# Patient Record
Sex: Male | Born: 1990 | Race: White | Hispanic: Yes | Marital: Single | Smoking: Never smoker
Health system: Southern US, Community
[De-identification: ages and names within clinical notes are randomized; demographics above are authoritative.]

## PROBLEM LIST (undated history)

## (undated) DIAGNOSIS — F419 Anxiety disorder, unspecified: Secondary | ICD-10-CM

## (undated) DIAGNOSIS — D61818 Other pancytopenia: Secondary | ICD-10-CM

## (undated) DIAGNOSIS — F840 Autistic disorder: Secondary | ICD-10-CM

## (undated) HISTORY — DX: Autistic disorder: F84.0

## (undated) HISTORY — DX: Other pancytopenia: D61.818

---

## 2015-05-16 DIAGNOSIS — D696 Thrombocytopenia, unspecified: Secondary | ICD-10-CM | POA: Diagnosis not present

## 2015-05-16 DIAGNOSIS — D72819 Decreased white blood cell count, unspecified: Secondary | ICD-10-CM | POA: Diagnosis not present

## 2015-10-11 DIAGNOSIS — Z111 Encounter for screening for respiratory tuberculosis: Secondary | ICD-10-CM | POA: Diagnosis not present

## 2015-10-11 DIAGNOSIS — F840 Autistic disorder: Secondary | ICD-10-CM | POA: Diagnosis not present

## 2016-04-27 DIAGNOSIS — I10 Essential (primary) hypertension: Secondary | ICD-10-CM | POA: Diagnosis not present

## 2016-04-27 DIAGNOSIS — R5381 Other malaise: Secondary | ICD-10-CM | POA: Diagnosis not present

## 2016-04-27 DIAGNOSIS — E784 Other hyperlipidemia: Secondary | ICD-10-CM | POA: Diagnosis not present

## 2016-05-11 NOTE — Progress Notes (Signed)
Hematology/Oncology Consult note [facility] Telephone:([phone] [phone]  Patient Care Team: Bobby Athens, MD as PCP - General (Internal Medicine)   Name of the patient: Bobby Dean  [id number]  February 08, 1991    Reason for referral- thrombocytopenia   Referring physician- Dr. Lavera Dean  Date of visit: 05/13/2016    History of presenting illness- Patient is a 26 year old male was then referred to [place] for evaluation and management of thrombocytopenia. He has a history of autism and intellectual visibility and currently lives in a group home for the last 7 months. His medical decision maker is Mr. Bobby Dean from [place] office.   Blood work from 04/28/2016 showed white count of 2.1 with an H&H of 15.5/45.1 and a platelet count of 51. CMP was within normal limits. TSH was normal at 1.09. At this time I do not have any prior CBCs for comparison.  Patient is here with his group home Asst. Bobby Dean today who reports that patient has been doing well over the last 7 months and has no known medical problems. He does not take any medications or over-the-counter medications. He has been feeling well and has had no sick days and denies any complaints. Patient is comfortable and is able to give Limited history and denies any complaints at this time his mother died of cancer (unknown which one). Other family history is not known.  ECOG PS- 0  Pain scale- 0   Review of systems- Review of Systems  Constitutional: Negative for chills, fever, malaise/fatigue and weight loss.  HENT: Negative for congestion, ear discharge and nosebleeds.   Eyes: Negative for blurred vision.  Respiratory: Negative for cough, hemoptysis, sputum production, shortness of breath and wheezing.   Cardiovascular: Negative for chest pain, palpitations, orthopnea and claudication.  Gastrointestinal: Negative for abdominal pain, blood in stool, constipation, diarrhea, heartburn,  melena, nausea and vomiting.  Genitourinary: Negative for dysuria, flank pain, frequency, hematuria and urgency.  Musculoskeletal: Negative for back pain, joint pain and myalgias.  Skin: Negative for rash.  Neurological: Negative for dizziness, tingling, focal weakness, seizures, weakness and headaches.  Endo/Heme/Allergies: Does not bruise/bleed easily.  Psychiatric/Behavioral: Negative for depression and suicidal ideas. The patient does not have insomnia.     No Known Allergies  Patient Active Problem List   Diagnosis Date Noted  . Autism 05/13/2016     Past Medical History:  Diagnosis Date  . Autism      History reviewed. No pertinent surgical history.  Social History   Social History  . Marital status: Single    Spouse name: N/A  . Number of children: N/A  . Years of education: N/A   Occupational History  . Not on file.   Social History Main Topics  . Smoking status: Never Smoker  . Smokeless tobacco: Never Used  . Alcohol use No  . Drug use: No  . Sexual activity: Not on file   Other Topics Concern  . Not on file   Social History Narrative  . No narrative on file     History reviewed. No pertinent family history. Mother died of cancer  No current outpatient prescriptions on file.   Physical exam:  Vitals:   05/13/16 1001  BP: 137/90  Pulse: 76  Resp: 18  Temp: 97.1 F (36.2 C)  TempSrc: Tympanic  Weight: 188 lb 15 oz (85.7 kg)  Height: 6' 2.8" (1.9 m)   Physical Exam  Constitutional: He is oriented to person, place, and time and  well-developed, well-nourished, and in no distress.  HENT:  Head: Normocephalic and atraumatic.  Eyes: EOM are normal. Pupils are equal, round, and reactive to light.  Neck: Normal range of motion.  Cardiovascular: Normal rate, regular rhythm and normal heart sounds.   Pulmonary/Chest: Effort normal and breath sounds normal.  Abdominal: Soft. Bowel sounds are normal.  No palpable splenomegaly  Lymphadenopathy:    No palpable cervical axillary or inguinal adenopathy  Neurological: He is alert and oriented to person, place, and time.  Skin: Skin is warm and dry.      Assessment and plan- Patient is a 26 y.o. male who has been referred to [place] for leukopenia and thrombocytopenia  1. Patient has significant leukopenia and thrombocytopenia based on labs from 04/28/2016. Differential on the CBC was not done. Today I will obtain a CBC with manual differential along with  B12 and folate as well as pathology review of his smear. I will also obtain HIV and hepatitis B and hepatitis C testing as well as peripheral flow cytometry. If his CBC shows improvement from prior values, this could be from secondary to a transient bone marrow suppression from a viral condition and can be monitored with a repeat CBC in 2 weeks' time   If his CBC remains more or less similar to his prior values or lower , I would like to proceed with a bone marrow biopsy ASAP to rule out primary hematologic disorder such as lymphoma or leukemia contribution to his pancytopenia. I will see the patient back in 2 weeks' time to discuss the results of his blood work and bone marrow biopsy and further management at that time.  I did discuss how bone marrow biopsy is done with radiology and have also given him written information about the same. Bobby Dean from his group home and will speak to Mr. [name] was in [place] and his healthcare proxy and proceed with bone marrow biopsy after he gives consent.   Thank you for this kind referral and the opportunity to participate in the care of this patient   Visit Diagnosis 1. Pancytopenia ([place])     [name], MD, MPH [place] at [facility] Pager- [phone] 05/13/2016 11:11 AM   ADDENDUM: cbc came back with wbc of 1.7 with anc of 0.4 consistent with severe neutropenia and platelet count of 22. He needs a bm biopsy asap. We will also inform his group home that he cannot play  basketball until his diagnosis is ascertained. He needs to stay away from sick people and call us if he has fever or infectious symptoms as well as any bleeding. We will obtain pt , ptt inr, ldh and monospot test when he comes for his bm biopsy.   [name], MD, MPH [place] at [facility] [phone] 05/13/2016 11:40 AM

## 2016-05-13 ENCOUNTER — Telehealth: Payer: Self-pay | Admitting: *Deleted

## 2016-05-13 ENCOUNTER — Inpatient Hospital Stay: Payer: Medicare Other | Attending: Oncology | Admitting: Oncology

## 2016-05-13 ENCOUNTER — Other Ambulatory Visit: Payer: Self-pay | Admitting: *Deleted

## 2016-05-13 ENCOUNTER — Encounter: Payer: Self-pay | Admitting: Oncology

## 2016-05-13 ENCOUNTER — Inpatient Hospital Stay: Payer: Medicare Other

## 2016-05-13 VITALS — BP 137/90 | HR 76 | Temp 97.1°F | Resp 18 | Ht 74.8 in | Wt 188.9 lb

## 2016-05-13 DIAGNOSIS — D61818 Other pancytopenia: Secondary | ICD-10-CM | POA: Diagnosis not present

## 2016-05-13 DIAGNOSIS — F840 Autistic disorder: Secondary | ICD-10-CM | POA: Insufficient documentation

## 2016-05-13 DIAGNOSIS — F419 Anxiety disorder, unspecified: Secondary | ICD-10-CM | POA: Diagnosis not present

## 2016-05-13 DIAGNOSIS — D696 Thrombocytopenia, unspecified: Secondary | ICD-10-CM

## 2016-05-13 LAB — DIFFERENTIAL
BASOS ABS: 0 10*3/uL (ref 0–0.1)
Basophils Relative: 1 %
EOS ABS: 0 10*3/uL (ref 0–0.7)
EOS PCT: 2 %
LYMPHS ABS: 0.8 10*3/uL — AB (ref 1.0–3.6)
LYMPHS PCT: 49 %
MONOS PCT: 25 %
Monocytes Absolute: 0.4 10*3/uL (ref 0.2–1.0)
NEUTROS PCT: 23 %
Neutro Abs: 0.4 10*3/uL — ABNORMAL LOW (ref 1.4–6.5)

## 2016-05-13 LAB — CBC
HEMATOCRIT: 45.3 % (ref 40.0–52.0)
HEMOGLOBIN: 15.6 g/dL (ref 13.0–18.0)
MCH: 28.6 pg (ref 26.0–34.0)
MCHC: 34.4 g/dL (ref 32.0–36.0)
MCV: 83.2 fL (ref 80.0–100.0)
Platelets: 22 10*3/uL — CL (ref 150–400)
RBC: 5.44 MIL/uL (ref 4.40–5.90)
RDW: 13.2 % (ref 11.5–14.5)
WBC: 1.7 10*3/uL — ABNORMAL LOW (ref 3.8–10.6)

## 2016-05-13 LAB — PATHOLOGIST SMEAR REVIEW

## 2016-05-13 LAB — VITAMIN B12: VITAMIN B 12: 401 pg/mL (ref 180–914)

## 2016-05-13 LAB — FOLATE: Folate: 26 ng/mL (ref >5.9)

## 2016-05-13 NOTE — Progress Notes (Signed)
Patient and group home representative have no medical history information and she reports that the guardian has no medical history information for either the patient or the family.  Patient reports no surgeries and never being sick.  Reports mother has passed but has no idea from what.  Unsure if father is alive or anything about him.  Patient has living sister and she is healthy.

## 2016-05-13 NOTE — Telephone Encounter (Signed)
Called Bobby Dean as well as calling DSS guardian Purvis SheffieldDarryl Moore and the guardian gave verbal permission to have bone marrow bx tom. Pt will need to be there 7 am for 8 am procedure.  I have called and left message with Bobby Dean to call me back and gave time and date but I had already spoke to her over the phone earlier today trying to figure out the entire process. She was willing to bring him in tom to the procedure. She called back jsut a moment ago and she will have the pt here in am 7 o clock and he will be NPO after midnight. Juanita in specials notified they will be here.

## 2016-05-14 ENCOUNTER — Ambulatory Visit
Admission: RE | Admit: 2016-05-14 | Discharge: 2016-05-14 | Disposition: A | Discharge status: Home / Self Care | Payer: Medicare Other | Source: Ambulatory Visit | Attending: Oncology | Admitting: Oncology

## 2016-05-14 ENCOUNTER — Other Ambulatory Visit (HOSPITAL_COMMUNITY)
Admission: RE | Admit: 2016-05-14 | Disposition: A | Discharge status: Home / Self Care | Payer: Medicaid Other | Source: Other Acute Inpatient Hospital | Attending: Oncology | Admitting: Oncology

## 2016-05-14 DIAGNOSIS — D61818 Other pancytopenia: Secondary | ICD-10-CM | POA: Diagnosis not present

## 2016-05-14 DIAGNOSIS — D72819 Decreased white blood cell count, unspecified: Secondary | ICD-10-CM | POA: Diagnosis not present

## 2016-05-14 DIAGNOSIS — F840 Autistic disorder: Secondary | ICD-10-CM | POA: Diagnosis not present

## 2016-05-14 DIAGNOSIS — D696 Thrombocytopenia, unspecified: Secondary | ICD-10-CM | POA: Insufficient documentation

## 2016-05-14 DIAGNOSIS — D704 Cyclic neutropenia: Secondary | ICD-10-CM | POA: Insufficient documentation

## 2016-05-14 DIAGNOSIS — F419 Anxiety disorder, unspecified: Secondary | ICD-10-CM | POA: Insufficient documentation

## 2016-05-14 HISTORY — DX: Anxiety disorder, unspecified: F41.9

## 2016-05-14 LAB — PROTIME-INR
INR: 1.11
Prothrombin Time: 14.4 seconds (ref 11.4–15.2)

## 2016-05-14 LAB — DIFFERENTIAL
Basophils Absolute: 0 10*3/uL (ref 0–0.1)
Basophils Relative: 1 %
Eosinophils Absolute: 0.1 10*3/uL (ref 0–0.7)
Eosinophils Relative: 4 %
LYMPHS ABS: 1.2 10*3/uL (ref 1.0–3.6)
LYMPHS PCT: 54 %
MONO ABS: 0.5 10*3/uL (ref 0.2–1.0)
Monocytes Relative: 25 %
NEUTROS PCT: 16 %
Neutro Abs: 0.3 10*3/uL — ABNORMAL LOW (ref 1.4–6.5)

## 2016-05-14 LAB — CBC
HCT: 46.7 % (ref 40.0–52.0)
HEMOGLOBIN: 15.9 g/dL (ref 13.0–18.0)
MCH: 28.3 pg (ref 26.0–34.0)
MCHC: 33.9 g/dL (ref 32.0–36.0)
MCV: 83.4 fL (ref 80.0–100.0)
PLATELETS: 24 10*3/uL — AB (ref 150–440)
RBC: 5.6 MIL/uL (ref 4.40–5.90)
RDW: 13.7 % (ref 11.5–14.5)
WBC: 2.2 10*3/uL — AB (ref 3.8–10.6)

## 2016-05-14 LAB — HEPATITIS B SURFACE ANTIGEN: HEP B S AG: NEGATIVE

## 2016-05-14 LAB — HIV ANTIBODY (ROUTINE TESTING W REFLEX): HIV Screen 4th Generation wRfx: NONREACTIVE

## 2016-05-14 LAB — HEPATITIS C ANTIBODY: HCV Ab: <0.1 s/co ratio (ref 0.0–0.9)

## 2016-05-14 LAB — LACTATE DEHYDROGENASE: LDH: 143 U/L (ref 98–192)

## 2016-05-14 LAB — BONE MARROW EXAM

## 2016-05-14 LAB — MONONUCLEOSIS SCREEN: MONO SCREEN: POSITIVE — AB

## 2016-05-14 LAB — APTT: aPTT: 32 seconds (ref 24–36)

## 2016-05-14 LAB — HEPATITIS B CORE ANTIBODY, TOTAL: HEP B C TOTAL AB: NEGATIVE

## 2016-05-14 MED ORDER — SODIUM CHLORIDE 0.9 % IV SOLN
INTRAVENOUS | Status: DC
  Administered 2016-05-14: 08:00:00 via INTRAVENOUS

## 2016-05-14 MED ORDER — FENTANYL CITRATE (PF) 100 MCG/2ML IJ SOLN
INTRAMUSCULAR | Status: AC
  Filled 2016-05-14: qty 4

## 2016-05-14 MED ORDER — MIDAZOLAM HCL 5 MG/5ML IJ SOLN
INTRAMUSCULAR | Status: AC
Start: 2016-05-14 — End: 2016-05-14
  Filled 2016-05-14: qty 5

## 2016-05-14 MED ORDER — FENTANYL CITRATE (PF) 100 MCG/2ML IJ SOLN
INTRAMUSCULAR | Status: AC | PRN
  Administered 2016-05-14: 25 ug via INTRAVENOUS
  Administered 2016-05-14: 50 ug via INTRAVENOUS

## 2016-05-14 MED ORDER — HEPARIN SOD (PORK) LOCK FLUSH 100 UNIT/ML IV SOLN
INTRAVENOUS | Status: AC
  Filled 2016-05-14: qty 5

## 2016-05-14 MED ORDER — MIDAZOLAM HCL 5 MG/5ML IJ SOLN
INTRAMUSCULAR | Status: AC | PRN
  Administered 2016-05-14 (×2): 1 mg via INTRAVENOUS

## 2016-05-14 NOTE — H&P (Signed)
Chief Complaint: Patient was seen in consultation today for a bone marrow biopsy at the request of Rao,Archana C  Referring Physician(s): Rao,Archana C  Patient Status: [facility] - Out-pt  History of Present Illness: Bobby Dean is a 26 y.o. male with autism who lives in a group home. Patient was found to have thrombocytopenia and leukopenia. Part of the patient's workup will include a bone marrow biopsy. The patient has no complaints today. Consent was obtained for the patient's legal guardian.  Past Medical History:  Diagnosis Date  . Anxiety   . Autism   . Autism     History reviewed. No pertinent surgical history.  Allergies: Patient has no known allergies.  Medications: Prior to Admission medications   Not on File     Family History  Problem Relation Age of Onset  . Cancer Mother     Social History   Social History  . Marital status: Single    Spouse name: N/A  . Number of children: N/A  . Years of education: N/A   Social History Main Topics  . Smoking status: Never Smoker  . Smokeless tobacco: Never Used  . Alcohol use No  . Drug use: No  . Sexual activity: Not Asked   Other Topics Concern  . None   Social History Narrative  . None    ECOG Status: 0 - Asymptomatic  Review of Systems: A 12 point ROS discussed and pertinent positives are indicated in the HPI above.  All other systems are negative.  Review of Systems  Constitutional: Negative.   Respiratory: Negative.   Cardiovascular: Negative.     Vital Signs: BP (!) 146/98   Pulse 73   Temp 98.4 F (36.9 C) (Oral)   Resp (!) 22   SpO2 100%   Physical Exam  Constitutional: He appears well-developed and well-nourished. No distress.  Cardiovascular: Normal rate, regular rhythm and normal heart sounds.  Exam reveals no gallop and no friction rub.   No murmur heard. Pulmonary/Chest: Effort normal and breath sounds normal. No respiratory distress. He has no wheezes.  Abdominal:  Soft. He exhibits no distension. There is no tenderness.    Mallampati Score:  MD Evaluation Airway: WNL Heart: WNL Abdomen: WNL Chest/ Lungs: WNL ASA  Classification: 1 Mallampati/Airway Score: One  Imaging: No results found.  Labs:  CBC:  Recent Labs  05/13/16 1045 05/14/16 0705  WBC 1.7* 2.2*  HGB 15.6 15.9  HCT 45.3 46.7  PLT 22* PENDING    COAGS:  Recent Labs  05/14/16 0705  INR 1.11  APTT 32    BMP: No results for input(s): NA, K, CL, CO2, GLUCOSE, BUN, CALCIUM, CREATININE, GFRNONAA, GFRAA in the last 8760 hours.  Invalid input(s): CMP  LIVER FUNCTION TESTS: No results for input(s): BILITOT, AST, ALT, ALKPHOS, PROT, ALBUMIN in the last 8760 hours.  TUMOR MARKERS: No results for input(s): AFPTM, CEA, CA199, CHROMGRNA in the last 8760 hours.  Assessment and Plan:  26 year old with leukopenia and thrombocytopenia. Patient presents for CT-guided bone marrow biopsy. The risks of the procedure including bleeding and infection were discussed. Informed consent was obtained from the patient's legal guardian. Plan for CT-guided bone marrow biopsy with moderate sedation.  Thank you for this interesting consult.  I greatly enjoyed meeting Bobby Dean and look forward to participating in their care.  A copy of this report was sent to the requesting provider on this date.  Electronically Signed: Carylon Perches 05/14/2016, 7:51 AM   I  spent a total of  15 Minutes   in face to face in clinical consultation, greater than 50% of which was counseling/coordinating care for a CT guided bone marrow biopsy.

## 2016-05-14 NOTE — Procedures (Signed)
CT guided bone marrow biopsy.   Minimal blood loss and no immediate complication.  See full report in Imaging.   

## 2016-05-18 ENCOUNTER — Telehealth: Payer: Self-pay | Admitting: *Deleted

## 2016-05-18 DIAGNOSIS — D709 Neutropenia, unspecified: Secondary | ICD-10-CM

## 2016-05-18 DIAGNOSIS — D696 Thrombocytopenia, unspecified: Secondary | ICD-10-CM

## 2016-05-18 NOTE — Telephone Encounter (Signed)
Called wendy the person over the family care home for pt. I told her that bone marrow does not show leukemia, wants pt to see md on Friday of this week and have labs.  He did test positive for mono.  Toniann FailWendy is agreeable to appt Friday 3/9 and 11  Am for blood work and 11:15 for see md and she is agreeable.

## 2016-05-20 ENCOUNTER — Other Ambulatory Visit: Payer: Self-pay | Admitting: *Deleted

## 2016-05-20 DIAGNOSIS — D696 Thrombocytopenia, unspecified: Secondary | ICD-10-CM

## 2016-05-21 ENCOUNTER — Encounter: Payer: Self-pay | Admitting: Oncology

## 2016-05-21 ENCOUNTER — Inpatient Hospital Stay (HOSPITAL_BASED_OUTPATIENT_CLINIC_OR_DEPARTMENT_OTHER): Payer: Medicare Other | Admitting: Oncology

## 2016-05-21 ENCOUNTER — Inpatient Hospital Stay: Payer: Medicare Other

## 2016-05-21 VITALS — BP 139/90 | HR 73 | Temp 97.4°F | Resp 18 | Ht 74.8 in | Wt 187.8 lb

## 2016-05-21 DIAGNOSIS — F419 Anxiety disorder, unspecified: Secondary | ICD-10-CM | POA: Diagnosis not present

## 2016-05-21 DIAGNOSIS — F840 Autistic disorder: Secondary | ICD-10-CM

## 2016-05-21 DIAGNOSIS — D61818 Other pancytopenia: Secondary | ICD-10-CM | POA: Diagnosis not present

## 2016-05-21 DIAGNOSIS — D696 Thrombocytopenia, unspecified: Secondary | ICD-10-CM

## 2016-05-21 DIAGNOSIS — D709 Neutropenia, unspecified: Secondary | ICD-10-CM

## 2016-05-21 LAB — CBC
HCT: 45.7 % (ref 40.0–52.0)
HEMOGLOBIN: 16 g/dL (ref 13.0–18.0)
MCH: 28.9 pg (ref 26.0–34.0)
MCHC: 35 g/dL (ref 32.0–36.0)
MCV: 82.6 fL (ref 80.0–100.0)
Platelets: 40 10*3/uL — ABNORMAL LOW (ref 150–440)
RBC: 5.54 MIL/uL (ref 4.40–5.90)
RDW: 13.5 % (ref 11.5–14.5)
WBC: 1.8 10*3/uL — AB (ref 3.8–10.6)

## 2016-05-21 LAB — CHROMOSOME ANALYSIS, BONE MARROW

## 2016-05-21 LAB — DIFFERENTIAL
Basophils Absolute: 0 10*3/uL (ref 0–0.1)
Basophils Relative: 2 %
EOS PCT: 1 %
Eosinophils Absolute: 0 10*3/uL (ref 0–0.7)
LYMPHS ABS: 0.8 10*3/uL — AB (ref 1.0–3.6)
Lymphocytes Relative: 44 %
MONO ABS: 0.4 10*3/uL (ref 0.2–1.0)
Monocytes Relative: 25 %
NEUTROS ABS: 0.5 10*3/uL — AB (ref 1.4–6.5)
Neutrophils Relative %: 28 %

## 2016-05-21 NOTE — Progress Notes (Signed)
Hematology/Oncology Consult note [facility]  Telephone:([phone] Fax:([phone]  Patient Care Team: [name], MD as PCP - General (Internal Medicine)   Name of the patient: Bobby Dean  [id number]  25-Nov-1990   Date of visit: 05/21/16  Diagnosis- pancytopenia likely viral etiology  Chief complaint/ Reason for visit- discuss bone marrow results  Heme/Onc history:  Patient is a 26 year old male was then referred to [place] for evaluation and management of thrombocytopenia. He has a history of autism and intellectual visibility and currently lives in a group home for the last 7 months. His medical decision maker is [name] from [place] office.   Blood work from 04/28/2016 showed white count of 2.1 with an H&H of 15.5/45.1 and a platelet count of 51. CMP was within normal limits. TSH was normal at 1.09. At this time I do not have any prior CBCs for comparison.  Patient is here with his group home Asst. [name] today who reports that patient has been doing well over the last 7 months and has no known medical problems. He does not take any medications or over-the-counter medications. He has been feeling well and has had no sick days and denies any complaints. Patient is comfortable and is able to give Limited history and denies any complaints at this time his mother died of cancer (unknown which one). Other family history is not known.  Bone marrow biopsy on 05/14/2016 showed normal trilineage hematopoiesis with no evidence of lymphoma or leukemia. Peripheral flow cytometry showed reversal of CD4 to CD8 ratio. HIV hepatitis B and hepatitis C testing was negative. B12 and folate were within normal limits. PT/PTT INR was within normal limits.  Review of peripheral smear showed leukopenia and moderate neutropenia. Thrombocytopenia. RBC morphology is normal. Scattered reactive appearing lymphocytes are seen. No blasts or early forms  noted.  Interval history- overall patient is doing well at his group home. Denies any fevers. Appetite is normal and he has lost 2 pounds of weight as compared to 2 weeks ago.  ECOG PS- 0 Pain scale- 0 Opioid associated constipation- no  Review of systems- Review of Systems  Constitutional: Negative for chills, fever, malaise/fatigue and weight loss.  HENT: Negative for congestion, ear discharge and nosebleeds.   Eyes: Negative for blurred vision.  Respiratory: Negative for cough, hemoptysis, sputum production, shortness of breath and wheezing.   Cardiovascular: Negative for chest pain, palpitations, orthopnea and claudication.  Gastrointestinal: Negative for abdominal pain, blood in stool, constipation, diarrhea, heartburn, melena, nausea and vomiting.  Genitourinary: Negative for dysuria, flank pain, frequency, hematuria and urgency.  Musculoskeletal: Negative for back pain, joint pain and myalgias.  Skin: Negative for rash.  Neurological: Negative for dizziness, tingling, focal weakness, seizures, weakness and headaches.  Endo/Heme/Allergies: Does not bruise/bleed easily.  Psychiatric/Behavioral: Negative for depression and suicidal ideas. The patient does not have insomnia.      Current treatment- observation  No Known Allergies   Past Medical History:  Diagnosis Date  . Anxiety   . Autism   . Autism      History reviewed. No pertinent surgical history.  Social History   Social History  . Marital status: Single    Spouse name: N/A  . Number of children: N/A  . Years of education: N/A   Occupational History  . Not on file.   Social History Main Topics  . Smoking status: Never Smoker  . Smokeless tobacco: Never Used  . Alcohol use No  .  Drug use: No  . Sexual activity: Not on file   Other Topics Concern  . Not on file   Social History Narrative  . No narrative on file    Family History  Problem Relation Age of Onset  . Cancer Mother     No current  outpatient prescriptions on file.  Physical exam:  Vitals:   05/21/16 1200 05/21/16 1220  BP: (!) 154/105 139/90  Pulse: 73   Resp: 18   Temp: 97.4 F (36.3 C)   TempSrc: Tympanic   Weight: 187 lb 13.3 oz (85.2 kg)   Height: 6' 2.8" (1.9 m)    Physical Exam  Constitutional: He is oriented to person, place, and time and well-developed, well-nourished, and in no distress.  HENT:  Head: Normocephalic and atraumatic.  Eyes: EOM are normal. Pupils are equal, round, and reactive to light.  Neck: Normal range of motion.  Cardiovascular: Normal rate, regular rhythm and normal heart sounds.   Pulmonary/Chest: Effort normal and breath sounds normal.  Abdominal: Soft. Bowel sounds are normal.  Neurological: He is alert and oriented to person, place, and time.  Skin: Skin is warm and dry.     No flowsheet data found. CBC Latest Ref Rng & Units 05/21/2016  WBC 3.8 - 10.6 K/uL 1.8(L)  Hemoglobin 13.0 - 18.0 g/dL 16.0  Hematocrit 40.0 - 52.0 % 45.7  Platelets 150 - 440 K/uL 40(L)    No images are attached to the encounter.  Ct Bone Marrow Biopsy & Aspiration  Result Date: 05/14/2016 INDICATION: 26 year old with leukopenia and thrombocytopenia. EXAM: CT GUIDED BONE MARROW ASPIRATES AND BIOPSY Physician: Stephan Minister. Anselm Pancoast, MD MEDICATIONS: None. ANESTHESIA/SEDATION: Fentanyl 75 mcg IV; Versed 2.0 mg IV Moderate Sedation Time:  22 minutes The patient was continuously monitored during the procedure by the interventional radiology nurse under my direct supervision. COMPLICATIONS: None immediate. PROCEDURE: The procedure was explained to the patient. The risks and benefits of the procedure were discussed and the patient's questions were addressed. Informed consent was obtained from the patient and his legal guardian. The patient was placed prone on CT scan. Images of the pelvis were obtained. Maximal barrier sterile technique was utilized including mask, sterile gowns, sterile gloves, sterile drape, hand  hygiene and skin antiseptic. The right side of back was prepped and draped in sterile fashion. The skin and right posterior iliac bone were anesthetized with 1% lidocaine. 11 gauge bone needle was directed into the right iliac bone with CT guidance. Two aspirates and one core biopsy obtained. Bandage placed over the puncture site. FINDINGS: Bone needle directed into the posterior right ilium. IMPRESSION: CT guided bone marrow aspirates and core biopsy. Electronically Signed   By: Markus Daft M.D.   On: 05/14/2016 09:39     Assessment and plan- Patient is a 26 y.o. male who was referred to [place] for neutropenia and thrombocytopenia  I discussed the results of the blood work with the patient as well as his group home provider. Essentially all the workup including bone marrow biopsy has been negative except for Monospot test which was positive. However patient has no signs and symptoms of infectious mononucleosis. On repeat CBC today his white count is still low at 1.8 but his platelet count is better as compared to 2 weeks ago when it was in the 20s. Clinically patient is asymptomatic and has no bleeding, bruising or infectious symptoms. I suspect that his pancytopenia is secondary to possible infectious mononucleosis versus some other viral etiology. I will  see the patient back in 1 week's time with a repeat CBC. ANA, copper levels and quantitative immunoglobulins today are pending. If his counts show further improvement next week and I will continue to follow his counts conservatively. 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 Thrombocytopenia ([place])   2. Neutropenia, unspecified type [facility])      [name], MD, MPH [facility] Pager- [phone] 05/21/2016 1:32 PM

## 2016-05-22 LAB — IGG, IGA, IGM
IGA: 200 mg/dL (ref 90–386)
IGG (IMMUNOGLOBIN G), SERUM: 965 mg/dL (ref 700–1600)
IGM, SERUM: 176 mg/dL — AB (ref 20–172)

## 2016-05-22 LAB — ANA W/REFLEX: Anti Nuclear Antibody(ANA): NEGATIVE

## 2016-05-22 LAB — COPPER, SERUM: Copper: 106 ug/dL (ref 72–166)

## 2016-05-28 ENCOUNTER — Other Ambulatory Visit: Payer: Self-pay | Admitting: *Deleted

## 2016-05-28 ENCOUNTER — Inpatient Hospital Stay: Payer: Medicare Other

## 2016-05-28 ENCOUNTER — Encounter: Payer: Self-pay | Admitting: Oncology

## 2016-05-28 ENCOUNTER — Inpatient Hospital Stay (HOSPITAL_BASED_OUTPATIENT_CLINIC_OR_DEPARTMENT_OTHER): Payer: Medicare Other | Admitting: Oncology

## 2016-05-28 VITALS — BP 106/72 | HR 71 | Temp 98.4°F | Resp 18 | Wt 188.7 lb

## 2016-05-28 DIAGNOSIS — D709 Neutropenia, unspecified: Secondary | ICD-10-CM

## 2016-05-28 DIAGNOSIS — B2790 Infectious mononucleosis, unspecified without complication: Secondary | ICD-10-CM

## 2016-05-28 DIAGNOSIS — F840 Autistic disorder: Secondary | ICD-10-CM | POA: Diagnosis not present

## 2016-05-28 DIAGNOSIS — D61818 Other pancytopenia: Secondary | ICD-10-CM

## 2016-05-28 DIAGNOSIS — D696 Thrombocytopenia, unspecified: Secondary | ICD-10-CM

## 2016-05-28 DIAGNOSIS — F419 Anxiety disorder, unspecified: Secondary | ICD-10-CM | POA: Diagnosis not present

## 2016-05-28 LAB — CBC
HCT: 43.7 % (ref 40.0–52.0)
Hemoglobin: 15.2 g/dL (ref 13.0–18.0)
MCH: 28.4 pg (ref 26.0–34.0)
MCHC: 34.8 g/dL (ref 32.0–36.0)
MCV: 81.8 fL (ref 80.0–100.0)
PLATELETS: 25 10*3/uL — AB (ref 150–400)
RBC: 5.34 MIL/uL (ref 4.40–5.90)
RDW: 13.5 % (ref 11.5–14.5)
WBC: 1.7 10*3/uL — AB (ref 3.8–10.6)

## 2016-05-28 LAB — DIFFERENTIAL
Basophils Absolute: 0 10*3/uL (ref 0–0.1)
Basophils Relative: 1 %
Eosinophils Absolute: 0 10*3/uL (ref 0–0.7)
Eosinophils Relative: 2 %
LYMPHS ABS: 0.9 10*3/uL — AB (ref 1.0–3.6)
LYMPHS PCT: 51 %
MONO ABS: 0.4 10*3/uL (ref 0.2–1.0)
MONOS PCT: 26 %
NEUTROS ABS: 0.3 10*3/uL — AB (ref 1.4–6.5)
NEUTROS PCT: 20 %

## 2016-05-28 MED ORDER — PREDNISONE 10 MG PO TABS: 55.0000 mg | ORAL_TABLET | Freq: Every day | ORAL | 0 refills | Status: DC

## 2016-05-28 MED ORDER — PANTOPRAZOLE SODIUM 20 MG PO TBEC: 20.0000 mg | DELAYED_RELEASE_TABLET | Freq: Every day | ORAL | 0 refills | Status: DC

## 2016-05-28 NOTE — Addendum Note (Signed)
Addended by: Corene CorneaVENABLE, Lyndel Sarate Y on: 05/28/2016 03:12 PM   Modules accepted: Orders

## 2016-05-28 NOTE — Progress Notes (Signed)
Hematology/Oncology Consult note [facility]  Telephone:([phone] [phone]  Patient Care Team: [name], MD as PCP - General (Internal Medicine)   Name of the patient: Bobby Dean  [id number]  March 20, 1990   Date of visit: 05/28/16  Diagnosis- neutropenia/ thrombocytopenia of unclear etiology possibly viral/ autoimmune  Chief complaint/ Reason for visit- routine f/u  Heme/Onc history: Patient is a 26 year old male was then referred to [place] for evaluation and management of thrombocytopenia. He has a history of autism and intellectual visibility and currently lives in a group home for the last 7 months. His medical decision maker is [name] from [place] office.   Blood work from 04/28/2016 showed white count of 2.1 with an H&H of 15.5/45.1 and a platelet count of 51. CMP was within normal limits. TSH was normal at 1.09. At this time I do not have any prior CBCs for comparison.  Patient is here with his group home Asst. [name] today who reports that patient has been doing well over the last 7 months and has no known medical problems. He does not take any medications or over-the-counter medications. He has been feeling well and has had no sick days and denies any complaints.Patient is comfortable and is able to give Limited history and denies any complaints at this time his mother died of cancer (unknown which one). Other family history is not known.  Bone marrow biopsy on 05/14/2016 showed normal trilineage hematopoiesis with no evidence of lymphoma or leukemia. Peripheral flow cytometry showed reversal of CD4 to CD8 ratio. HIV hepatitis B and hepatitis C testing was negative. B12 and folate were within normal limits. PT/PTT INR was within normal limits.  Review of peripheral smear showed leukopenia and moderate neutropenia. Thrombocytopenia. RBC morphology is normal. Scattered reactive appearing lymphocytes are seen. No  blasts or early forms noted.  Monospot testing was positive. ANA was negative. Quantitative immunoglobulins were within normal limits. Serum copper was within normal limits.  Interval history- doing well. Denies any complaints. No fever  ECOG PS- 0 Pain scale- 0 Opioid associated constipation- no  Review of systems- Review of Systems  Constitutional: Negative for chills, fever, malaise/fatigue and weight loss.  HENT: Negative for congestion, ear discharge and nosebleeds.   Eyes: Negative for blurred vision.  Respiratory: Negative for cough, hemoptysis, sputum production, shortness of breath and wheezing.   Cardiovascular: Negative for chest pain, palpitations, orthopnea and claudication.  Gastrointestinal: Negative for abdominal pain, blood in stool, constipation, diarrhea, heartburn, melena, nausea and vomiting.  Genitourinary: Negative for dysuria, flank pain, frequency, hematuria and urgency.  Musculoskeletal: Negative for back pain, joint pain and myalgias.  Skin: Negative for rash.  Neurological: Negative for dizziness, tingling, focal weakness, seizures, weakness and headaches.  Endo/Heme/Allergies: Does not bruise/bleed easily.  Psychiatric/Behavioral: Negative for depression and suicidal ideas. The patient does not have insomnia.      Current treatment- observation  No Known Allergies   Past Medical History:  Diagnosis Date  . Anxiety   . Autism   . Autism      No past surgical history on file.  Social History   Social History  . Marital status: Single    Spouse name: N/A  . Number of children: N/A  . Years of education: N/A   Occupational History  . Not on file.   Social History Main Topics  . Smoking status: Never Smoker  . Smokeless tobacco: Never Used  . Alcohol use No  .  Drug use: No  . Sexual activity: Not on file   Other Topics Concern  . Not on file   Social History Narrative  . No narrative on file    Family History  Problem Relation  Age of Onset  . Cancer Mother     No current outpatient prescriptions on file.  Physical exam:  Vitals:   05/28/16 1158  BP: 106/72  Pulse: 71  Resp: 18  Temp: 98.4 F (36.9 C)  TempSrc: Tympanic  Weight: 188 lb 11.4 oz (85.6 kg)   Physical Exam  Constitutional: He is oriented to person, place, and time and well-developed, well-nourished, and in no distress.  HENT:  Head: Normocephalic and atraumatic.  Eyes: EOM are normal. Pupils are equal, round, and reactive to light.  Neck: Normal range of motion.  Cardiovascular: Normal rate, regular rhythm and normal heart sounds.   Pulmonary/Chest: Effort normal and breath sounds normal.  Abdominal: Soft. Bowel sounds are normal.  Neurological: He is alert and oriented to person, place, and time.  Skin: Skin is warm and dry.     No flowsheet data found. CBC Latest Ref Rng & Units 05/28/2016  WBC 3.8 - 10.6 K/uL 1.7(L)  Hemoglobin 13.0 - 18.0 g/dL 15.2  Hematocrit 40.0 - 52.0 % 43.7  Platelets 150 - 400 K/uL 25(LL)    No images are attached to the encounter.  Ct Bone Marrow Biopsy & Aspiration  Result Date: 05/14/2016 INDICATION: 26 year old with leukopenia and thrombocytopenia. EXAM: CT GUIDED BONE MARROW ASPIRATES AND BIOPSY Physician: Stephan Minister. Anselm Pancoast, MD MEDICATIONS: None. ANESTHESIA/SEDATION: Fentanyl 75 mcg IV; Versed 2.0 mg IV Moderate Sedation Time:  22 minutes The patient was continuously monitored during the procedure by the interventional radiology nurse under my direct supervision. COMPLICATIONS: None immediate. PROCEDURE: The procedure was explained to the patient. The risks and benefits of the procedure were discussed and the patient's questions were addressed. Informed consent was obtained from the patient and his legal guardian. The patient was placed prone on CT scan. Images of the pelvis were obtained. Maximal barrier sterile technique was utilized including mask, sterile gowns, sterile gloves, sterile drape, hand hygiene  and skin antiseptic. The right side of back was prepped and draped in sterile fashion. The skin and right posterior iliac bone were anesthetized with 1% lidocaine. 11 gauge bone needle was directed into the right iliac bone with CT guidance. Two aspirates and one core biopsy obtained. Bandage placed over the puncture site. FINDINGS: Bone needle directed into the posterior right ilium. IMPRESSION: CT guided bone marrow aspirates and core biopsy. Electronically Signed   By: Markus Daft M.D.   On: 05/14/2016 09:39     Assessment and plan- Patient is a 26 y.o. male referred to [place] for neutropenia/ thrombocytopenia  Patient continues to have significant severe neutropenia and thrombocytopenia lasting for at least 1 month. Patient remains asymptomatic. We do not have any prior counts for comparison. All the blood work so far including B12, folate, ANA, monitor immunoglobulins, coagulation studies, HIV and hepatitis B and hepatitis C testing have been negative. Today I will check a CMV DNA PCR as well as ultrasound of the abdomen to look for any hepatosplenomegaly. Clinically I do not palpate any splenomegaly or significant adenopathy. Given that his neutropenia and thrombocytopenia are severe and does not seem to be improving I will start him on empiric trial of steroids with 1 mg/kg of prednisone. I discussed the side effects of prednisone with his group home provider and the  patient that he may speed instance insomnia or psychotic symptoms. The group home provider understands and will communicate this to his medical decision maker as well. Patient will return in one week for repeat CBC which would be reviewed by Dr. Rogue Bussing in my absence. If his neutropenia/thrombocytopenia did not respond to steroids at all- it would be reasonable to stop the steroids at that point and referred him to [facility] for further management and second opinion. However if his counts respond to empiric trial of steroids, it may  be worthwhile to taper him slowly over the next 2-3 weeks. I will see the patient back in 2 weeks' time with a repeat CBC.   Visit Diagnosis 1. Thrombocytopenia ([place])   2. Monospot test positive   3. Neutropenia, unspecified type [facility])      [name], MD, MPH [facility] at [facility] Pager- [phone] 05/28/2016 1:29 PM

## 2016-05-28 NOTE — Progress Notes (Signed)
Patient offers no complaints today.  Would like to discuss when patient can return to playing sports.

## 2016-05-31 ENCOUNTER — Encounter (HOSPITAL_COMMUNITY): Payer: Self-pay

## 2016-06-01 LAB — CMV DNA, QUANTITATIVE, PCR
CMV DNA Quant: NEGATIVE IU/mL
LOG10 CMV QN DNA PL: UNDETERMINED {Log_IU}/mL

## 2016-06-04 ENCOUNTER — Telehealth: Payer: Self-pay

## 2016-06-04 ENCOUNTER — Inpatient Hospital Stay: Payer: Medicare Other

## 2016-06-04 ENCOUNTER — Ambulatory Visit: Admission: RE | Admit: 2016-06-04 | Payer: Medicare Other | Source: Ambulatory Visit

## 2016-06-04 DIAGNOSIS — F419 Anxiety disorder, unspecified: Secondary | ICD-10-CM | POA: Diagnosis not present

## 2016-06-04 DIAGNOSIS — D696 Thrombocytopenia, unspecified: Secondary | ICD-10-CM

## 2016-06-04 DIAGNOSIS — B2790 Infectious mononucleosis, unspecified without complication: Secondary | ICD-10-CM

## 2016-06-04 DIAGNOSIS — D709 Neutropenia, unspecified: Secondary | ICD-10-CM

## 2016-06-04 DIAGNOSIS — F840 Autistic disorder: Secondary | ICD-10-CM | POA: Diagnosis not present

## 2016-06-04 DIAGNOSIS — D61818 Other pancytopenia: Secondary | ICD-10-CM | POA: Diagnosis not present

## 2016-06-04 LAB — DIFFERENTIAL
BASOS PCT: 0 %
Basophils Absolute: 0 10*3/uL (ref 0–0.1)
EOS ABS: 0 10*3/uL (ref 0–0.7)
Eosinophils Relative: 1 %
LYMPHS ABS: 0.6 10*3/uL — AB (ref 1.0–3.6)
Lymphocytes Relative: 38 %
MONO ABS: 0.2 10*3/uL (ref 0.2–1.0)
MONOS PCT: 13 %
Neutro Abs: 0.7 10*3/uL — ABNORMAL LOW (ref 1.4–6.5)
Neutrophils Relative %: 49 %

## 2016-06-04 LAB — CBC
HEMATOCRIT: 42.9 % (ref 40.0–52.0)
HEMOGLOBIN: 15.2 g/dL (ref 13.0–18.0)
MCH: 28.7 pg (ref 26.0–34.0)
MCHC: 35.4 g/dL (ref 32.0–36.0)
MCV: 81 fL (ref 80.0–100.0)
Platelets: 49 10*3/uL — ABNORMAL LOW (ref 150–440)
RBC: 5.29 MIL/uL (ref 4.40–5.90)
RDW: 13.1 % (ref 11.5–14.5)
WBC: 1.5 10*3/uL — ABNORMAL LOW (ref 3.8–10.6)

## 2016-06-04 NOTE — Telephone Encounter (Signed)
plt came up to 49 and neut .7 better on prednisone and per Dr. Leonard SchwartzB and he states to stay on same dose and to get repeat cbc with diff on Thursday next week.Toniann Fail. Wendy is agreeable to the lab appt. I have sent message to sch. To give him appt next week.

## 2016-06-04 NOTE — Telephone Encounter (Signed)
LVM to call the office to get details of r/s appt. Please call [phone]

## 2016-06-07 ENCOUNTER — Ambulatory Visit
Admission: RE | Admit: 2016-06-07 | Discharge: 2016-06-07 | Disposition: A | Discharge status: Home / Self Care | Payer: Medicare Other | Source: Ambulatory Visit | Attending: Oncology | Admitting: Oncology

## 2016-06-07 DIAGNOSIS — K769 Liver disease, unspecified: Secondary | ICD-10-CM | POA: Diagnosis not present

## 2016-06-07 DIAGNOSIS — D61818 Other pancytopenia: Secondary | ICD-10-CM

## 2016-06-09 ENCOUNTER — Telehealth: Payer: Self-pay | Admitting: *Deleted

## 2016-06-09 NOTE — Telephone Encounter (Signed)
Dr Smith Robertao has requested that I make a [facility] ref to [facility] hematology for this pt. I called the consult line and [facility] and she states that the hematology dept requires a fax with demographics, labs, notes. Fax it to [phone]. Phone number directly [phone].  The average turn around time to get info in coomputer loaded for review is 3 days. If it needs to be urgent then put it on fax cover sheet and I have faxed the info with urgent on the cover sheet.

## 2016-06-10 ENCOUNTER — Inpatient Hospital Stay: Payer: Medicare Other

## 2016-06-10 DIAGNOSIS — F840 Autistic disorder: Secondary | ICD-10-CM | POA: Diagnosis not present

## 2016-06-10 DIAGNOSIS — F419 Anxiety disorder, unspecified: Secondary | ICD-10-CM | POA: Diagnosis not present

## 2016-06-10 DIAGNOSIS — D696 Thrombocytopenia, unspecified: Secondary | ICD-10-CM

## 2016-06-10 DIAGNOSIS — D61818 Other pancytopenia: Secondary | ICD-10-CM | POA: Diagnosis not present

## 2016-06-10 DIAGNOSIS — B2790 Infectious mononucleosis, unspecified without complication: Secondary | ICD-10-CM

## 2016-06-10 LAB — DIFFERENTIAL
BASOS PCT: 1 %
Basophils Absolute: 0 10*3/uL (ref 0–0.1)
Eosinophils Absolute: 0 10*3/uL (ref 0–0.7)
Eosinophils Relative: 1 %
LYMPHS PCT: 43 %
Lymphs Abs: 1.6 10*3/uL (ref 1.0–3.6)
MONOS PCT: 17 %
Monocytes Absolute: 0.6 10*3/uL (ref 0.2–1.0)
NEUTROS ABS: 1.4 10*3/uL (ref 1.4–6.5)
Neutrophils Relative %: 38 %

## 2016-06-10 LAB — CBC
HCT: 44.3 % (ref 40.0–52.0)
Hemoglobin: 15.5 g/dL (ref 13.0–18.0)
MCH: 28.7 pg (ref 26.0–34.0)
MCHC: 34.9 g/dL (ref 32.0–36.0)
MCV: 82.1 fL (ref 80.0–100.0)
Platelets: 99 10*3/uL — ABNORMAL LOW (ref 150–440)
RBC: 5.4 MIL/uL (ref 4.40–5.90)
RDW: 13.1 % (ref 11.5–14.5)
WBC: 3.6 10*3/uL — ABNORMAL LOW (ref 3.8–10.6)

## 2016-06-15 ENCOUNTER — Inpatient Hospital Stay: Payer: Medicare Other | Attending: Oncology | Admitting: Oncology

## 2016-06-15 ENCOUNTER — Encounter: Payer: Self-pay | Admitting: Oncology

## 2016-06-15 ENCOUNTER — Inpatient Hospital Stay: Payer: Medicare Other

## 2016-06-15 VITALS — BP 125/88 | HR 76 | Temp 97.6°F | Resp 18 | Wt 183.1 lb

## 2016-06-15 DIAGNOSIS — F840 Autistic disorder: Secondary | ICD-10-CM | POA: Diagnosis not present

## 2016-06-15 DIAGNOSIS — D709 Neutropenia, unspecified: Secondary | ICD-10-CM

## 2016-06-15 DIAGNOSIS — F419 Anxiety disorder, unspecified: Secondary | ICD-10-CM

## 2016-06-15 DIAGNOSIS — D61818 Other pancytopenia: Secondary | ICD-10-CM | POA: Diagnosis not present

## 2016-06-15 DIAGNOSIS — Z79899 Other long term (current) drug therapy: Secondary | ICD-10-CM | POA: Insufficient documentation

## 2016-06-15 DIAGNOSIS — D696 Thrombocytopenia, unspecified: Secondary | ICD-10-CM

## 2016-06-15 DIAGNOSIS — B2790 Infectious mononucleosis, unspecified without complication: Secondary | ICD-10-CM

## 2016-06-15 LAB — DIFFERENTIAL
BASOS ABS: 0 10*3/uL (ref 0–0.1)
Basophils Relative: 0 %
Eosinophils Absolute: 0 10*3/uL (ref 0–0.7)
Eosinophils Relative: 0 %
Lymphocytes Relative: 12 %
Lymphs Abs: 0.6 10*3/uL — ABNORMAL LOW (ref 1.0–3.6)
MONOS PCT: 2 %
Monocytes Absolute: 0.1 10*3/uL — ABNORMAL LOW (ref 0.2–1.0)
NEUTROS ABS: 4.2 10*3/uL (ref 1.4–6.5)
Neutrophils Relative %: 86 %

## 2016-06-15 LAB — CBC
HCT: 47.4 % (ref 40.0–52.0)
HEMOGLOBIN: 16.6 g/dL (ref 13.0–18.0)
MCH: 28.9 pg (ref 26.0–34.0)
MCHC: 35.1 g/dL (ref 32.0–36.0)
MCV: 82.4 fL (ref 80.0–100.0)
Platelets: 131 10*3/uL — ABNORMAL LOW (ref 150–440)
RBC: 5.75 MIL/uL (ref 4.40–5.90)
RDW: 13.6 % (ref 11.5–14.5)
WBC: 4.9 10*3/uL (ref 3.8–10.6)

## 2016-06-15 MED ORDER — PREDNISONE 10 MG PO TABS: ORAL_TABLET | ORAL | 0 refills | Status: DC

## 2016-06-15 NOTE — Progress Notes (Signed)
Patient offers no complaints. 

## 2016-06-15 NOTE — Progress Notes (Signed)
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 He has a history of autism and intellectual visibility and currently lives in a group home for the last 7 months. His medical decision maker is [name] from [place] office.   Blood work from 04/28/2016 showed white count of 2.1 with an H&H of 15.5/45.1 and a platelet count of 51. CMP was within normal limits. TSH was normal at 1.09. At this time I do not have any prior CBCs for comparison.  Patient is here with his group home Asst. [name] today who reports that patient has been doing well over the last 7 months and has no known medical problems. He does not take any medications or over-the-counter medications. He has been feeling well and has had no sick days and denies any complaints.Patient is comfortable and is able to give Limited history and denies any complaints at this time his mother died of cancer (unknown which one). Other family history is not known.  Bone marrow biopsy on 05/14/2016 showed normal trilineage hematopoiesis with no evidence of lymphoma or leukemia. Peripheral flow cytometry showed reversal of CD4 to CD8 ratio. HIV hepatitis B and hepatitis C testing was negative. B12 and folate were within normal limits. PT/PTT INR was within normal limits.  Review of peripheral smear showed leukopenia and moderate neutropenia. Thrombocytopenia. RBC morphology is normal. Scattered reactive appearing lymphocytes are seen. No  blasts or early forms noted.  Monospot testing was positive. ANA was negative. Quantitative immunoglobulins were within normal limits. Serum copper was within normal limits. CMV DNA PCR negative  Patient was started on empiric high dose steroids for possible autoimmune etiology on 05/28/16. Trend of his cbc is as follows:  Component     Latest Ref Rng & Units 05/13/2016 05/14/2016 05/21/2016 05/28/2016  WBC     3.8 - 10.6 K/uL 1.7 (L) 2.2 (L) 1.8 (L) 1.7 (L)  RBC     4.40 - 5.90 MIL/uL 5.44 5.60 5.54 5.34  Hemoglobin     13.0 - 18.0 g/dL 15.6 15.9 16.0 15.2  HCT     40.0 - 52.0 % 45.3 46.7 45.7 43.7  MCV     80.0 - 100.0 fL 83.2 83.4 82.6 81.8  MCH     26.0 - 34.0 pg 28.6 28.3 28.9 28.4  MCHC     32.0 - 36.0 g/dL 34.4 33.9 35.0 34.8  RDW     11.5 - 14.5 % 13.2 13.7 13.5 13.5  Platelets     150 - 440 K/uL 22 (LL) 24 (LL) 40 (L) 25 (LL)   Component     Latest Ref Rng & Units 06/04/2016 06/10/2016  WBC     3.8 - 10.6 K/uL 1.5 (L) 3.6 (L)  RBC     4.40 - 5.90 MIL/uL 5.29 5.40  Hemoglobin     13.0 - 18.0 g/dL 15.2 15.5  HCT     40.0 - 52.0 % 42.9 44.3  MCV     80.0 - 100.0 fL 81.0 82.1  MCH     26.0 -  34.0 pg 28.7 28.7  MCHC     32.0 - 36.0 g/dL 35.4 34.9  RDW     11.5 - 14.5 % 13.1 13.1  Platelets     150 - 440 K/uL 49 (L) 99 (L)    Interval history- Patient is currently on 55 mg of prednisone and is tolerating it well without any significant side effects. Reports doing well and has no complaints today  ECOG PS- 0 Pain scale- 0   Review of systems- Review of Systems  Constitutional: Negative for chills, fever, malaise/fatigue and weight loss.  HENT: Negative for congestion, ear discharge and nosebleeds.   Eyes: Negative for blurred vision.  Respiratory: Negative for cough, hemoptysis, sputum production, shortness of breath and wheezing.   Cardiovascular: Negative for chest pain, palpitations, orthopnea and claudication.  Gastrointestinal: Negative for abdominal pain,  blood in stool, constipation, diarrhea, heartburn, melena, nausea and vomiting.  Genitourinary: Negative for dysuria, flank pain, frequency, hematuria and urgency.  Musculoskeletal: Negative for back pain, joint pain and myalgias.  Skin: Negative for rash.  Neurological: Negative for dizziness, tingling, focal weakness, seizures, weakness and headaches.  Endo/Heme/Allergies: Does not bruise/bleed easily.  Psychiatric/Behavioral: Negative for depression and suicidal ideas. The patient does not have insomnia.      Current treatment- high dose steroids  No Known Allergies   Past Medical History:  Diagnosis Date  . Anxiety   . Autism   . Autism      No past surgical history on file.  Social History   Social History  . Marital status: Single    Spouse name: N/A  . Number of children: N/A  . Years of education: N/A   Occupational History  . Not on file.   Social History Main Topics  . Smoking status: Never Smoker  . Smokeless tobacco: Never Used  . Alcohol use No  . Drug use: No  . Sexual activity: Not on file   Other Topics Concern  . Not on file   Social History Narrative  . No narrative on file    Family History  Problem Relation Age of Onset  . Cancer Mother      Current Outpatient Prescriptions:  .  pantoprazole (PROTONIX) 20 MG tablet, Take 1 tablet (20 mg total) by mouth daily., Disp: 30 tablet, Rfl: 0 .  predniSONE (DELTASONE) 10 MG tablet, Take 5.5 tablets (55 mg total) by mouth daily with breakfast., Disp: 165 tablet, Rfl: 0  Physical exam:  Vitals:   06/15/16 1109  BP: 125/88  Pulse: 76  Resp: 18  Temp: 97.6 F (36.4 C)  TempSrc: Tympanic  Weight: 183 lb 2 oz (83.1 kg)   Physical Exam  Constitutional: He is oriented to person, place, and time and well-developed, well-nourished, and in no distress.  HENT:  Head: Normocephalic and atraumatic.  Eyes: EOM are normal. Pupils are equal, round, and reactive to light.  Neck: Normal range of  motion.  Cardiovascular: Normal rate, regular rhythm and normal heart sounds.   Pulmonary/Chest: Effort normal and breath sounds normal.  Abdominal: Soft. Bowel sounds are normal.  Neurological: He is alert and oriented to person, place, and time.  Skin: Skin is warm and dry.     No flowsheet data found. CBC Latest Ref Rng & Units 06/15/2016  WBC 3.8 - 10.6 K/uL 4.9  Hemoglobin 13.0 - 18.0 g/dL 16.6  Hematocrit 40.0 - 52.0 % 47.4  Platelets 150 - 440 K/uL 131(L)    No images are attached to the  encounter.  US Abdomen Complete  Result Date: 06/07/2016 CLINICAL DATA:  Pancytopenia and EXAM: ABDOMEN ULTRASOUND COMPLETE COMPARISON:  None in PACs FINDINGS: Gallbladder: No gallstones or wall thickening visualized. No sonographic Murphy sign noted by sonographer. Common bile duct: Diameter: 2 mm Liver: The hepatic echotexture is normal the surface contour of the liver is normal. In the right lobe there is a 6 x 6 x 7 mm hyperechoic focus. There is no intrahepatic ductal dilation. IVC: No abnormality visualized. Pancreas: Visualized portion unremarkable. Spleen: Size and appearance within normal limits. Right Kidney: Length: 12.4 cm. Echogenicity within normal limits. No mass or hydronephrosis visualized. Left Kidney: Length: 12.1 cm. Echogenicity within normal limits. No mass or hydronephrosis visualized. Abdominal aorta: No aneurysm visualized. Other findings: There is no ascites. IMPRESSION: There is no splenomegaly nor other acute intra-abdominal abnormality observed. A 33m hyperechoic focus in the right hepatic lobe is most compatible with a hemangioma. Electronically Signed   By: David  JMartiniqueM.D.   On: 06/07/2016 09:20     Assessment and plan- Patient is a 26y.o. male with possible autoimmune steroid responsive pancytopenia  Counts significantly improved with high dose steroids. Currently patient is taking 55 mg of prednisone daily and I will start a slow taper at this time by 5 mg every 3  days. I will repeat his CBC with differential in 2 weeks' time and if his CBC continues to remain stable he will continue his taper and eventually come off steroids in 4-6 weeks' time. I will see him back in 4 weeks with CBC and differential   Visit Diagnosis 1. Other [place]cytopenia ([place]   2. Autoimmune pancytopenia ([place]      Dr. ARanda Evens MD, MPH [facility]ager- [phone]/05/2016 12:54 PM

## 2016-06-28 ENCOUNTER — Other Ambulatory Visit: Payer: Self-pay | Admitting: *Deleted

## 2016-06-28 ENCOUNTER — Other Ambulatory Visit: Payer: Self-pay | Admitting: Oncology

## 2016-06-28 NOTE — Telephone Encounter (Signed)
Bobby Dean states he took his last pill protonix today and needs refill. I will send refill in

## 2016-06-29 ENCOUNTER — Inpatient Hospital Stay: Payer: Medicare Other

## 2016-06-29 ENCOUNTER — Other Ambulatory Visit: Payer: Self-pay | Admitting: Oncology

## 2016-06-29 ENCOUNTER — Telehealth: Payer: Self-pay | Admitting: *Deleted

## 2016-06-29 ENCOUNTER — Other Ambulatory Visit: Payer: Self-pay | Admitting: *Deleted

## 2016-06-29 DIAGNOSIS — D61818 Other pancytopenia: Secondary | ICD-10-CM | POA: Diagnosis not present

## 2016-06-29 DIAGNOSIS — F840 Autistic disorder: Secondary | ICD-10-CM | POA: Diagnosis not present

## 2016-06-29 DIAGNOSIS — F419 Anxiety disorder, unspecified: Secondary | ICD-10-CM | POA: Diagnosis not present

## 2016-06-29 DIAGNOSIS — Z79899 Other long term (current) drug therapy: Secondary | ICD-10-CM | POA: Diagnosis not present

## 2016-06-29 DIAGNOSIS — D696 Thrombocytopenia, unspecified: Secondary | ICD-10-CM

## 2016-06-29 LAB — CBC WITH DIFFERENTIAL/PLATELET
Basophils Absolute: 0 10*3/uL (ref 0–0.1)
Basophils Relative: 1 %
Eosinophils Absolute: 0 10*3/uL (ref 0–0.7)
Eosinophils Relative: 1 %
HEMATOCRIT: 46.4 % (ref 40.0–52.0)
Hemoglobin: 16.2 g/dL (ref 13.0–18.0)
Lymphocytes Relative: 39 %
Lymphs Abs: 1.7 10*3/uL (ref 1.0–3.6)
MCH: 29.1 pg (ref 26.0–34.0)
MCHC: 34.8 g/dL (ref 32.0–36.0)
MCV: 83.5 fL (ref 80.0–100.0)
MONO ABS: 0.5 10*3/uL (ref 0.2–1.0)
Monocytes Relative: 11 %
NEUTROS ABS: 2.1 10*3/uL (ref 1.4–6.5)
NEUTROS PCT: 48 %
Platelets: 65 10*3/uL — ABNORMAL LOW (ref 150–440)
RBC: 5.56 MIL/uL (ref 4.40–5.90)
RDW: 13.2 % (ref 11.5–14.5)
WBC: 4.3 10*3/uL (ref 3.8–10.6)

## 2016-06-29 MED ORDER — SULFAMETHOXAZOLE-TRIMETHOPRIM 800-160 MG PO TABS: 1.0000 | ORAL_TABLET | ORAL | 0 refills | Status: DC

## 2016-06-29 NOTE — Telephone Encounter (Signed)
Called and spoke to [place] and pt's plt dropped to 64, wbc was still normal.  Dr. Smith Robert had placed him on schedule to dec. Prednisone and we wanted to see what does he is on and it is 30 mg today and tom. And then he is to decrease to 25 mg.  I spoke to Smith Robert and she wants him to be at 40 mg daily and repeat cbc next week on tues. Toniann Fail will take the blister pack or prednisone back to [facility] and she wants me to call pharmacy with changes and get new rx. I called [place] [facility] and told him 40 mg daily starting today and he already took 30 mg today so he will need 10 mg to equal 40 for today and fill it through tues of next week. He will have labs rechecked and we will make changes if needed beginning wed of next week.

## 2016-06-29 NOTE — Progress Notes (Signed)
Please inform patient not to taper steroids at this point. Continue same dose (ask what dose he is on) and recheck cbc next week. Wbc is normal but platelets have dropped again. Start him on bactrim DS 160 mg twice a week for pcp prophylaxis. thanks

## 2016-06-29 NOTE — Telephone Encounter (Signed)
-----   Message from Creig Hines, MD sent at 06/29/2016  8:38 AM EDT ----- Please inform patient not to taper steroids at this point. Continue same dose (ask what dose he is on) and recheck cbc next week. Wbc is normal but platelets have dropped again. Start him on bactrim DS 160 mg twice a week for pcp prophylaxis. thanks

## 2016-06-29 NOTE — Progress Notes (Signed)
It was done and separate note in system.

## 2016-07-06 ENCOUNTER — Telehealth: Payer: Self-pay | Admitting: *Deleted

## 2016-07-06 ENCOUNTER — Inpatient Hospital Stay: Payer: Medicare Other

## 2016-07-06 ENCOUNTER — Other Ambulatory Visit: Payer: Self-pay | Admitting: *Deleted

## 2016-07-06 DIAGNOSIS — D696 Thrombocytopenia, unspecified: Secondary | ICD-10-CM

## 2016-07-06 DIAGNOSIS — F419 Anxiety disorder, unspecified: Secondary | ICD-10-CM | POA: Diagnosis not present

## 2016-07-06 DIAGNOSIS — F840 Autistic disorder: Secondary | ICD-10-CM | POA: Diagnosis not present

## 2016-07-06 DIAGNOSIS — D61818 Other pancytopenia: Secondary | ICD-10-CM | POA: Diagnosis not present

## 2016-07-06 DIAGNOSIS — Z79899 Other long term (current) drug therapy: Secondary | ICD-10-CM | POA: Diagnosis not present

## 2016-07-06 DIAGNOSIS — T451X5A Adverse effect of antineoplastic and immunosuppressive drugs, initial encounter: Principal | ICD-10-CM

## 2016-07-06 DIAGNOSIS — D61810 Antineoplastic chemotherapy induced pancytopenia: Secondary | ICD-10-CM

## 2016-07-06 LAB — CBC WITH DIFFERENTIAL/PLATELET
Basophils Absolute: 0 10*3/uL (ref 0–0.1)
Basophils Relative: 1 %
EOS ABS: 0.1 10*3/uL (ref 0–0.7)
Eosinophils Relative: 1 %
HCT: 44.5 % (ref 40.0–52.0)
HEMOGLOBIN: 15.6 g/dL (ref 13.0–18.0)
LYMPHS ABS: 1.7 10*3/uL (ref 1.0–3.6)
Lymphocytes Relative: 40 %
MCH: 29 pg (ref 26.0–34.0)
MCHC: 35.1 g/dL (ref 32.0–36.0)
MCV: 82.5 fL (ref 80.0–100.0)
MONOS PCT: 8 %
Monocytes Absolute: 0.3 10*3/uL (ref 0.2–1.0)
NEUTROS PCT: 50 %
Neutro Abs: 2.1 10*3/uL (ref 1.4–6.5)
Platelets: 62 10*3/uL — ABNORMAL LOW (ref 150–440)
RBC: 5.39 MIL/uL (ref 4.40–5.90)
RDW: 13.5 % (ref 11.5–14.5)
WBC: 4.2 10*3/uL (ref 3.8–10.6)

## 2016-07-06 NOTE — Telephone Encounter (Signed)
Called Toniann Fail and let her know that plt 62 and she would like to decrease the prednisone every 3 days by 5 mg. Toniann Fail will take the steroids back to pharmacy and I will call the taper in. I called [place] and told the pharmacy to dec. Dose every 3 days by 5 mg. She is going to start dec. To 35 mg tom. And then every 3 days change again enough prednisone to get him through 5/3 and that is the day of the appt here and the taper may change.

## 2016-07-06 NOTE — Progress Notes (Signed)
Please inform patient that he can restart slow steroid taper by 5 mg Q3 days and we will recheck cbc in 10 days

## 2016-07-15 ENCOUNTER — Inpatient Hospital Stay: Payer: Medicare Other

## 2016-07-15 ENCOUNTER — Inpatient Hospital Stay: Payer: Medicare Other | Attending: Oncology | Admitting: Oncology

## 2016-07-15 ENCOUNTER — Encounter: Payer: Self-pay | Admitting: Oncology

## 2016-07-15 VITALS — BP 130/88 | HR 64 | Temp 97.4°F | Resp 18 | Wt 182.0 lb

## 2016-07-15 DIAGNOSIS — Z79899 Other long term (current) drug therapy: Secondary | ICD-10-CM | POA: Diagnosis not present

## 2016-07-15 DIAGNOSIS — F840 Autistic disorder: Secondary | ICD-10-CM | POA: Diagnosis not present

## 2016-07-15 DIAGNOSIS — F419 Anxiety disorder, unspecified: Secondary | ICD-10-CM | POA: Diagnosis not present

## 2016-07-15 DIAGNOSIS — D696 Thrombocytopenia, unspecified: Secondary | ICD-10-CM

## 2016-07-15 DIAGNOSIS — D61818 Other pancytopenia: Secondary | ICD-10-CM | POA: Diagnosis not present

## 2016-07-15 LAB — CBC WITH DIFFERENTIAL/PLATELET
Basophils Absolute: 0 10*3/uL (ref 0–0.1)
Basophils Relative: 1 %
Eosinophils Absolute: 0 10*3/uL (ref 0–0.7)
Eosinophils Relative: 2 %
HEMATOCRIT: 42.2 % (ref 40.0–52.0)
Hemoglobin: 14.9 g/dL (ref 13.0–18.0)
LYMPHS PCT: 55 %
Lymphs Abs: 1 10*3/uL (ref 1.0–3.6)
MCH: 28.8 pg (ref 26.0–34.0)
MCHC: 35.4 g/dL (ref 32.0–36.0)
MCV: 81.4 fL (ref 80.0–100.0)
MONO ABS: 0.4 10*3/uL (ref 0.2–1.0)
Monocytes Relative: 20 %
NEUTROS ABS: 0.4 10*3/uL — AB (ref 1.4–6.5)
Neutrophils Relative %: 22 %
Platelets: 13 10*3/uL — CL (ref 150–400)
RBC: 5.18 MIL/uL (ref 4.40–5.90)
RDW: 13.3 % (ref 11.5–14.5)
WBC: 1.8 10*3/uL — ABNORMAL LOW (ref 3.8–10.6)

## 2016-07-15 MED ORDER — PREDNISONE 10 MG PO TABS: 55.0000 mg | ORAL_TABLET | Freq: Every day | ORAL | 0 refills | Status: DC

## 2016-07-15 NOTE — Progress Notes (Signed)
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   Chief complaint/ Reason for visit- routine f/u  Heme/Onc history: Patient is a 26 year old male was then referred to [place] for evaluation and management of thrombocytopenia. He has a history of autism and intellectual visibility and currently lives in a group home for the last 7 months. His medical decision maker is [name] from [place] office.   Blood work from 04/28/2016 showed white count of 2.1 with an H&H of 15.5/45.1 and a platelet count of 51. CMP was within normal limits. TSH was normal at 1.09. At this time I do not have any prior CBCs for comparison.  Patient is here with his group home Asst. [name] today who reports that patient has been doing well over the last 7 months and has no known medical problems. He does not take any medications or over-the-counter medications. He has been feeling well and has had no sick days and denies any complaints.Patient is comfortable and is able to give Limited history and denies any complaints at this time his mother died of cancer (unknown which one). Other family history is not known.  Bone marrow biopsy on 05/14/2016 showed normal trilineage hematopoiesis with no evidence of lymphoma or leukemia. Peripheral flow cytometry showed reversal of CD4 to CD8 ratio. HIV hepatitis B and hepatitis C testing was negative. B12 and folate were within normal limits. PT/PTT INR was within normal limits.  Review of peripheral smear showed leukopenia and moderate neutropenia. Thrombocytopenia. RBC morphology is normal. Scattered reactive appearing lymphocytes are seen. No  blasts or early forms noted.  Monospot testing was positive. ANA was negative. Quantitative immunoglobulins were within normal limits. Serum copper was within normal limits. CMV DNA PCR negative  Patient was started on empiric high dose steroids for possible autoimmune etiology on 05/28/16. His counts normalized. Then his steroid taper was started. His WBC remained normal but his platelets began to decline 60's.   Interval history- today patient feels well. He came off steroids yesterday. Denies any complaints    Review of systems- Review of Systems  Constitutional: Negative for chills, fever, malaise/fatigue and weight loss.  HENT: Negative for congestion, ear discharge and nosebleeds.   Eyes: Negative for blurred vision.  Respiratory: Negative for cough, hemoptysis, sputum production, shortness of breath and wheezing.   Cardiovascular: Negative for chest pain, palpitations, orthopnea and claudication.  Gastrointestinal: Negative for abdominal pain, blood in stool, constipation, diarrhea, heartburn, melena, nausea and vomiting.  Genitourinary: Negative for dysuria, flank pain, frequency, hematuria and urgency.  Musculoskeletal: Negative for back pain, joint pain and myalgias.  Skin: Negative for rash.  Neurological: Negative for dizziness, tingling, focal weakness, seizures, weakness and headaches.  Endo/Heme/Allergies: Does not bruise/bleed easily.  Psychiatric/Behavioral: Negative for depression and suicidal ideas. The patient does not have insomnia.        No Known Allergies   Past Medical History:  Diagnosis Date  . Anxiety   . Autism   . Autism      No past surgical history on file.  Social History   Social History  . Marital status: Single    Spouse name: N/A  . Number of children: N/A  . Years of education:  N/A   Occupational History  . Not on file.   Social History Main Topics  . Smoking status: Never Smoker  . Smokeless tobacco: Never Used  . Alcohol  use No  . Drug use: No  . Sexual activity: Not on file   Other Topics Concern  . Not on file   Social History Narrative  . No narrative on file    Family History  Problem Relation Age of Onset  . Cancer Mother      Current Outpatient Prescriptions:  .  pantoprazole (PROTONIX) 20 MG tablet, TAKE 1 TABLET BY MOUTH ONCE DAILY., Disp: 30 tablet, Rfl: 0 .  sulfamethoxazole-trimethoprim (BACTRIM DS,SEPTRA DS) 800-160 MG tablet, Take 1 tablet by mouth 3 (three) times a week., Disp: 24 tablet, Rfl: 0 .  predniSONE (DELTASONE) 10 MG tablet, Take 5.5 tablets (55 mg total) by mouth daily with breakfast. 55 mg daily for 8 days and will repeat cbc 5/10 to determined if dose can change or stay the same, Disp: 78 tablet, Rfl: 0  Physical exam:  Vitals:   07/15/16 0901  BP: 130/88  Pulse: 64  Resp: 18  Temp: 97.4 F (36.3 C)  TempSrc: Tympanic  Weight: 182 lb (82.6 kg)   Physical Exam  Constitutional: He is oriented to person, place, and time and well-developed, well-nourished, and in no distress.  HENT:  Head: Normocephalic and atraumatic.  Eyes: EOM are normal. Pupils are equal, round, and reactive to light.  Neck: Normal range of motion.  Cardiovascular: Normal rate, regular rhythm and normal heart sounds.   Pulmonary/Chest: Effort normal and breath sounds normal.  Abdominal: Soft. Bowel sounds are normal.  Neurological: He is alert and oriented to person, place, and time.  Skin: Skin is warm and dry.     No flowsheet data found. CBC Latest Ref Rng & Units 07/15/2016  WBC 3.8 - 10.6 K/uL 1.8(L)  Hemoglobin 13.0 - 18.0 g/dL 14.9  Hematocrit 40.0 - 52.0 % 42.2  Platelets 150 - 400 K/uL 13(LL)     Assessment and plan- Patient is a 26 y.o. 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 I will therefore start him back on prednisone 60 mg daily.  No taper. Continue protonix and bactrim prophylaxis. I will obtain CT chest abdomen pelvis to r/o lymphoma. I would like to use a steroid sparing agent like rituxan for his autoimmune pancytopenia to see if he can come off steroids. I will however send him to [facility] hematology for a second opinion before initiating rituxan. There are case reports of rituxan being efefctive in autoimmune pancytopenia  Patient is to avoid any contact sports. Call us if he has any bleeding, bruising or fever.  We will recheck cbc next week and I will see him in 2 weeks with repeat cbc after 2nd opinion for possible rituxan on that day   Visit Diagnosis 1. Autoimmune pancytopenia ([place])   2. Other pancytopenia ([place])      [name], MD, MPH [place] at [facility] Pager- [phone] 07/15/2016 11:58 AM

## 2016-07-15 NOTE — Progress Notes (Signed)
Patient does not offer any problems today.  

## 2016-07-16 ENCOUNTER — Other Ambulatory Visit: Payer: Self-pay | Admitting: *Deleted

## 2016-07-16 DIAGNOSIS — B2790 Infectious mononucleosis, unspecified without complication: Secondary | ICD-10-CM

## 2016-07-16 DIAGNOSIS — D61818 Other pancytopenia: Secondary | ICD-10-CM

## 2016-07-19 ENCOUNTER — Other Ambulatory Visit: Payer: Self-pay | Admitting: *Deleted

## 2016-07-19 DIAGNOSIS — B2790 Infectious mononucleosis, unspecified without complication: Secondary | ICD-10-CM

## 2016-07-19 DIAGNOSIS — D696 Thrombocytopenia, unspecified: Secondary | ICD-10-CM

## 2016-07-19 DIAGNOSIS — D709 Neutropenia, unspecified: Secondary | ICD-10-CM

## 2016-07-20 ENCOUNTER — Telehealth: Payer: Self-pay | Admitting: *Deleted

## 2016-07-20 NOTE — Telephone Encounter (Signed)
-----   Message from Corene CorneaSharon Y Venable, RN sent at 07/20/2016  2:50 PM EDT -----   ----- Message ----- From: Marisa CyphersHayes, Takia M, NT Sent: 07/20/2016   1:29 PM To: Corene CorneaSharon Y Venable, RN  9:30 on 5/11 @ [facility].  Arrive @ 9:00 Pt needs to pick up contrast ahead of time.  ----- Message ----- From: Corene CorneaVenable, Sharon Y, RN Sent: 07/20/2016  12:44 PM To: Marisa Cyphersakia M Hayes, NT  I entered a ct scan of chest, abd, and pelvis. Can you get it this week on Friday and let me know. thanks

## 2016-07-20 NOTE — Telephone Encounter (Signed)
PC to LillyWendy regarding CT for Friday, instructed nothing by mouth 4 hours prior to scan, and that she needs to pick up Contrast before Friday for him to drink. Instructed that he needs to be at [facility] by 9 AM 5/11 for appt at 9:30. She repeated this back to me and said she will have him here

## 2016-07-22 ENCOUNTER — Inpatient Hospital Stay: Payer: Medicare Other

## 2016-07-22 ENCOUNTER — Telehealth: Payer: Self-pay | Admitting: *Deleted

## 2016-07-22 DIAGNOSIS — D696 Thrombocytopenia, unspecified: Secondary | ICD-10-CM | POA: Diagnosis not present

## 2016-07-22 DIAGNOSIS — D61818 Other pancytopenia: Secondary | ICD-10-CM

## 2016-07-22 DIAGNOSIS — F840 Autistic disorder: Secondary | ICD-10-CM | POA: Diagnosis not present

## 2016-07-22 DIAGNOSIS — F419 Anxiety disorder, unspecified: Secondary | ICD-10-CM | POA: Diagnosis not present

## 2016-07-22 DIAGNOSIS — Z79899 Other long term (current) drug therapy: Secondary | ICD-10-CM | POA: Diagnosis not present

## 2016-07-22 LAB — CBC WITH DIFFERENTIAL/PLATELET
BASOS PCT: 1 %
Basophils Absolute: 0 10*3/uL (ref 0–0.1)
EOS ABS: 0 10*3/uL (ref 0–0.7)
Eosinophils Relative: 2 %
HEMATOCRIT: 41 % (ref 40.0–52.0)
HEMOGLOBIN: 14.7 g/dL (ref 13.0–18.0)
LYMPHS ABS: 1.4 10*3/uL (ref 1.0–3.6)
Lymphocytes Relative: 60 %
MCH: 29 pg (ref 26.0–34.0)
MCHC: 35.8 g/dL (ref 32.0–36.0)
MCV: 80.9 fL (ref 80.0–100.0)
Monocytes Absolute: 0.6 10*3/uL (ref 0.2–1.0)
Monocytes Relative: 24 %
NEUTROS ABS: 0.3 10*3/uL — AB (ref 1.4–6.5)
NEUTROS PCT: 13 %
Platelets: 75 10*3/uL — ABNORMAL LOW (ref 150–440)
RBC: 5.07 MIL/uL (ref 4.40–5.90)
RDW: 13.6 % (ref 11.5–14.5)
WBC: 2.4 10*3/uL — AB (ref 3.8–10.6)

## 2016-07-22 NOTE — Telephone Encounter (Addendum)
Critical ANC 0.3. Dr Smith Robertao informed

## 2016-07-23 ENCOUNTER — Ambulatory Visit
Admission: RE | Admit: 2016-07-23 | Discharge: 2016-07-23 | Disposition: A | Discharge status: Home / Self Care | Payer: Medicare Other | Source: Ambulatory Visit | Attending: Oncology | Admitting: Oncology

## 2016-07-23 DIAGNOSIS — B2790 Infectious mononucleosis, unspecified without complication: Secondary | ICD-10-CM | POA: Diagnosis not present

## 2016-07-23 DIAGNOSIS — D696 Thrombocytopenia, unspecified: Secondary | ICD-10-CM | POA: Insufficient documentation

## 2016-07-23 DIAGNOSIS — K769 Liver disease, unspecified: Secondary | ICD-10-CM | POA: Insufficient documentation

## 2016-07-23 DIAGNOSIS — D61818 Other pancytopenia: Secondary | ICD-10-CM

## 2016-07-23 DIAGNOSIS — R935 Abnormal findings on diagnostic imaging of other abdominal regions, including retroperitoneum: Secondary | ICD-10-CM | POA: Diagnosis not present

## 2016-07-23 DIAGNOSIS — D709 Neutropenia, unspecified: Secondary | ICD-10-CM | POA: Insufficient documentation

## 2016-07-23 DIAGNOSIS — R918 Other nonspecific abnormal finding of lung field: Secondary | ICD-10-CM | POA: Diagnosis not present

## 2016-07-23 MED ORDER — IOPAMIDOL (ISOVUE-300) INJECTION 61%
100.0000 mL | Freq: Once | INTRAVENOUS | Status: AC | PRN
  Administered 2016-07-23: 100 mL via INTRAVENOUS

## 2016-07-29 ENCOUNTER — Inpatient Hospital Stay: Payer: Medicare Other

## 2016-07-29 ENCOUNTER — Inpatient Hospital Stay (HOSPITAL_BASED_OUTPATIENT_CLINIC_OR_DEPARTMENT_OTHER): Payer: Medicare Other | Admitting: Oncology

## 2016-07-29 ENCOUNTER — Encounter: Payer: Self-pay | Admitting: Oncology

## 2016-07-29 VITALS — BP 135/90 | HR 61 | Temp 95.8°F | Resp 18 | Wt 183.3 lb

## 2016-07-29 DIAGNOSIS — Z23 Encounter for immunization: Secondary | ICD-10-CM

## 2016-07-29 DIAGNOSIS — Z79899 Other long term (current) drug therapy: Secondary | ICD-10-CM

## 2016-07-29 DIAGNOSIS — D61810 Antineoplastic chemotherapy induced pancytopenia: Secondary | ICD-10-CM

## 2016-07-29 DIAGNOSIS — F840 Autistic disorder: Secondary | ICD-10-CM

## 2016-07-29 DIAGNOSIS — Z299 Encounter for prophylactic measures, unspecified: Secondary | ICD-10-CM

## 2016-07-29 DIAGNOSIS — D696 Thrombocytopenia, unspecified: Secondary | ICD-10-CM | POA: Diagnosis not present

## 2016-07-29 DIAGNOSIS — F419 Anxiety disorder, unspecified: Secondary | ICD-10-CM | POA: Diagnosis not present

## 2016-07-29 DIAGNOSIS — D61818 Other pancytopenia: Secondary | ICD-10-CM

## 2016-07-29 DIAGNOSIS — T451X5A Adverse effect of antineoplastic and immunosuppressive drugs, initial encounter: Secondary | ICD-10-CM

## 2016-07-29 LAB — CBC WITH DIFFERENTIAL/PLATELET
BASOS PCT: 1 %
Basophils Absolute: 0 10*3/uL (ref 0–0.1)
EOS ABS: 0.1 10*3/uL (ref 0–0.7)
EOS PCT: 1 %
HCT: 43.6 % (ref 40.0–52.0)
HEMOGLOBIN: 15.5 g/dL (ref 13.0–18.0)
LYMPHS ABS: 1.4 10*3/uL (ref 1.0–3.6)
Lymphocytes Relative: 33 %
MCH: 29.1 pg (ref 26.0–34.0)
MCHC: 35.4 g/dL (ref 32.0–36.0)
MCV: 82.2 fL (ref 80.0–100.0)
Monocytes Absolute: 0.5 10*3/uL (ref 0.2–1.0)
Monocytes Relative: 13 %
NEUTROS PCT: 52 %
Neutro Abs: 2.1 10*3/uL (ref 1.4–6.5)
PLATELETS: 130 10*3/uL — AB (ref 150–440)
RBC: 5.3 MIL/uL (ref 4.40–5.90)
RDW: 13.5 % (ref 11.5–14.5)
WBC: 4.2 10*3/uL (ref 3.8–10.6)

## 2016-07-29 MED ORDER — PNEUMOCOCCAL 13-VAL CONJ VACC IM SUSP
0.5000 mL | Freq: Once | INTRAMUSCULAR | Status: AC
  Administered 2016-07-29: 0.5 mL via INTRAMUSCULAR
  Filled 2016-07-29: qty 0.5

## 2016-07-29 MED ORDER — HAEMOPHILUS B POLYSAC CONJ VAC IM SOLR
0.5000 mL | Freq: Once | INTRAMUSCULAR | Status: AC
  Administered 2016-07-29: 0.5 mL via INTRAMUSCULAR
  Filled 2016-07-29: qty 0.5

## 2016-07-29 MED ORDER — SODIUM CHLORIDE 0.9% FLUSH
10.0000 mL | INTRAVENOUS | Status: DC | PRN
  Filled 2016-07-29: qty 10

## 2016-07-29 MED ORDER — HEPARIN SOD (PORK) LOCK FLUSH 100 UNIT/ML IV SOLN: 500.0000 [IU] | Freq: Once | INTRAVENOUS | Status: DC

## 2016-07-29 MED ORDER — PREDNISONE 10 MG PO TABS: 55.0000 mg | ORAL_TABLET | Freq: Every day | ORAL | 0 refills | Status: DC

## 2016-07-29 MED ORDER — MENINGOCOCCAL A C Y&W-135 OLIG IM SOLR
0.5000 mL | Freq: Once | INTRAMUSCULAR | Status: AC
  Administered 2016-07-29: 0.5 mL via INTRAMUSCULAR
  Filled 2016-07-29: qty 0.5

## 2016-07-29 NOTE — Progress Notes (Signed)
Hematology/Oncology Consult note [facility]  Telephone:([phone] Fax:([phone]  Patient Care Team: [name], MD as PCP - General (Internal Medicine)   Name of the patient: Bobby Dean  [id number]  [birth year]   Date of visit: 07/29/16  Diagnosis- autoimmune pancytopenia  Chief complaint/ Reason for visit- routine f/u  Heme/Onc history: Patient is a 26 year old male was then referred to [place] for evaluation and management of thrombocytopenia. He has a history of autism and intellectual visibility and currently lives in a group home for the last 7 months. His medical decision maker is [name] from [place] office.   Blood work from 04/28/2016 showed white count of 2.1 with an H&H of 15.5/45.1 and a platelet count of 51. CMP was within normal limits. TSH was normal at 1.09. At this time I do not have any prior CBCs for comparison.  Patient is here with his group home Asst. [name] today who reports that patient has been doing well over the last 7 months and has no known medical problems. He does not take any medications or over-the-counter medications. He has been feeling well and has had no sick days and denies any complaints.Patient is comfortable and is able to give Limited history and denies any complaints at this time his mother died of cancer (unknown which one). Other family history is not known.  Bone marrow biopsy on 05/14/2016 showed normal trilineage hematopoiesis with no evidence of lymphoma or leukemia. Peripheral flow cytometry showed reversal of CD4 to CD8 ratio. HIV hepatitis B and hepatitis C testing was negative. B12 and folate were within normal limits. PT/PTT INR was within normal limits.  Review of peripheral smear showed leukopenia and moderate neutropenia. Thrombocytopenia. RBC morphology is normal. Scattered reactive appearing lymphocytes are seen. No blasts or early forms noted.  Monospot testing was  positive. ANA was negative. Quantitative immunoglobulins were within normal limits. Serum copper was within normal limits. CMV DNA PCR negative  Patient was started on empiric high dose steroids for possible autoimmune etiology on 05/28/16. His counts normalized. Then his steroid taper was started. His WBC remained normal but his platelets began to decline 60's.    Interval history- tolerating high dose steroids well. No significant side effects    Review of systems- Review of Systems  Constitutional: Negative for chills, fever, malaise/fatigue and weight loss.  HENT: Negative for congestion, ear discharge and nosebleeds.   Eyes: Negative for blurred vision.  Respiratory: Negative for cough, hemoptysis, sputum production, shortness of breath and wheezing.   Cardiovascular: Negative for chest pain, palpitations, orthopnea and claudication.  Gastrointestinal: Negative for abdominal pain, blood in stool, constipation, diarrhea, heartburn, melena, nausea and vomiting.  Genitourinary: Negative for dysuria, flank pain, frequency, hematuria and urgency.  Musculoskeletal: Negative for back pain, joint pain and myalgias.  Skin: Negative for rash.  Neurological: Negative for dizziness, tingling, focal weakness, seizures, weakness and headaches.  Endo/Heme/Allergies: Does not bruise/bleed easily.  Psychiatric/Behavioral: Negative for depression and suicidal ideas. The patient does not have insomnia.       No Known Allergies   Past Medical History:  Diagnosis Date  . Anxiety   . Autism   . Autism      No past surgical history on file.  Social History   Social History  . Marital status: Single    Spouse name: N/A  . Number of children: N/A  . Years of education: N/A   Occupational History  . Not on file.  Social History Main Topics  . Smoking status: Never Smoker  . Smokeless tobacco: Never Used  . Alcohol use No  . Drug use: No  . Sexual activity: Not on file   Other  Topics Concern  . Not on file   Social History Narrative  . No narrative on file    Family History  Problem Relation Age of Onset  . Cancer Mother      Current Outpatient Prescriptions:  .  pantoprazole (PROTONIX) 20 MG tablet, TAKE 1 TABLET BY MOUTH ONCE DAILY., Disp: 30 tablet, Rfl: 0 .  sulfamethoxazole-trimethoprim (BACTRIM DS,SEPTRA DS) 800-160 MG tablet, Take 1 tablet by mouth 3 (three) times a week., Disp: 24 tablet, Rfl: 0 .  predniSONE (DELTASONE) 10 MG tablet, Take 5.5 tablets (55 mg total) by mouth daily with breakfast. 55 mg daily, Disp: 164 tablet, Rfl: 0 No current facility-administered medications for this visit.   Facility-Administered Medications Ordered in Other Visits:  .  heparin lock flush 100 unit/mL, 500 Units, Intravenous, Once, Sindy Guadeloupe, MD .  sodium chloride flush (NS) 0.9 % injection 10 mL, 10 mL, Intravenous, PRN, Sindy Guadeloupe, MD  Physical exam:  Vitals:   07/29/16 0852  BP: 135/90  Pulse: 61  Resp: 18  Temp: (!) 95.8 F (35.4 C)  TempSrc: Tympanic  Weight: 183 lb 4.8 oz (83.1 kg)   Physical Exam  Constitutional: He is oriented to person, place, and time and well-developed, well-nourished, and in no distress.  HENT:  Head: Normocephalic and atraumatic.  Eyes: EOM are normal. Pupils are equal, round, and reactive to light.  Neck: Normal range of motion.  Cardiovascular: Normal rate, regular rhythm and normal heart sounds.   Pulmonary/Chest: Effort normal and breath sounds normal.  Abdominal: Soft. Bowel sounds are normal.  Neurological: He is alert and oriented to person, place, and time.  Skin: Skin is warm and dry.     No flowsheet data found. CBC Latest Ref Rng & Units 07/29/2016  WBC 3.8 - 10.6 K/uL 4.2  Hemoglobin 13.0 - 18.0 g/dL 15.5  Hematocrit 40.0 - 52.0 % 43.6  Platelets 150 - 440 K/uL 130(L)    No images are attached to the encounter.  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 Oncology notes: Patient is a 26 year old male was then referred to [place] for evaluation and management of thrombocytopenia. He has a history of autism and intellectual visibility and currently lives in a group home for the last 7 months. Blood work from 04/28/2016 showed white count of 2.1 with an H&H of 15.5/45.1 and a platelet count of 51. CMP was within normal limits. TSH was normal at 1.09. EXAM: CT CHEST, ABDOMEN, AND PELVIS WITH CONTRAST TECHNIQUE: Multidetector CT imaging of the chest, abdomen and pelvis was performed following the standard protocol during bolus administration of intravenous contrast. CONTRAST:  187m ISOVUE-300 IOPAMIDOL (ISOVUE-300) INJECTION 61% COMPARISON:  Abdomen ultrasound, 06/07/2016. FINDINGS: CT CHEST FINDINGS Cardiovascular: No significant vascular findings. Normal heart size. No pericardial effusion. Great vessels are within normal limits. Mediastinum/Nodes: No enlarged mediastinal, hilar, or axillary lymph nodes. Thyroid gland, trachea, and esophagus demonstrate no significant findings. Lungs/Pleura: Lungs are clear. No pleural effusion or pneumothorax. CT ABDOMEN PELVIS FINDINGS Hepatobiliary: 6 mm low-density lesion in segment 7, nonspecific, likely a cyst or small hemangioma. No other liver abnormality. Normal gallbladder. No bile duct dilation. Pancreas: Unremarkable. No pancreatic ductal dilatation or surrounding inflammatory changes.  Spleen: Spleen is homogeneous in attenuation. No mass or focal lesion. Spleen measures 13 x 6 x 12 cm with a calculated volume of 573 mL, within the normal range. Adrenals/Urinary Tract: Adrenal glands are unremarkable. Kidneys are normal, without renal calculi, focal lesion, or hydronephrosis. Bladder is unremarkable. Stomach/Bowel: Stomach is within normal limits. Appendix appears normal. No evidence of bowel wall thickening, distention, or inflammatory changes.  Vascular/Lymphatic: No significant vascular findings are present. No enlarged abdominal or pelvic lymph nodes. Reproductive: Prostate is unremarkable. Other: No abdominal wall hernia or abnormality. No abdominopelvic ascites. MUSCULOSKELETAL FINDINGS No acute or significant osseous findings. IMPRESSION: 1. No acute findings within the chest, abdomen or pelvis. 2. No evidence of malignancy.  No adenopathy or splenomegaly. 3. 6 mm low-density liver lesion. In this patient's age group, this can be considered benign based on the ACR consensus guidelines. No additional imaging recommended. 4. No other abnormalities within the chest, abdomen or pelvis. Electronically Signed   By: Lajean Manes M.D.   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 Oncology notes: Patient is a 26 year old male was then referred to [place] for evaluation and management of thrombocytopenia. He has a history of autism and intellectual visibility and currently lives in a group home for the last 7 months. Blood work from 04/28/2016 showed white count of 2.1 with an H&H of 15.5/45.1 and a platelet count of 51. CMP was within normal limits. TSH was normal at 1.09. EXAM: CT CHEST, ABDOMEN, AND PELVIS WITH CONTRAST TECHNIQUE: Multidetector CT imaging of the chest, abdomen and pelvis was performed following the standard protocol during bolus administration of intravenous contrast. CONTRAST:  170m ISOVUE-300 IOPAMIDOL (ISOVUE-300) INJECTION 61% COMPARISON:  Abdomen ultrasound, 06/07/2016. FINDINGS: CT CHEST FINDINGS Cardiovascular: No significant vascular findings. Normal heart size. No pericardial effusion. Great vessels are within normal limits. Mediastinum/Nodes: No enlarged mediastinal, hilar, or axillary lymph nodes. Thyroid gland, trachea, and esophagus demonstrate no significant findings. Lungs/Pleura: Lungs are  clear. No pleural effusion or pneumothorax. CT ABDOMEN PELVIS FINDINGS Hepatobiliary: 6 mm low-density lesion in segment 7, nonspecific, likely a cyst or small hemangioma. No other liver abnormality. Normal gallbladder. No bile duct dilation. Pancreas: Unremarkable. No pancreatic ductal dilatation or surrounding inflammatory changes. Spleen: Spleen is homogeneous in attenuation. No mass or focal lesion. Spleen measures 13 x 6 x 12 cm with a calculated volume of 573 mL, within the normal range. Adrenals/Urinary Tract: Adrenal glands are unremarkable. Kidneys are normal, without renal calculi, focal lesion, or hydronephrosis. Bladder is unremarkable. Stomach/Bowel: Stomach is within normal limits. Appendix appears normal. No evidence of bowel wall thickening, distention, or inflammatory changes. Vascular/Lymphatic: No significant vascular findings are present. No enlarged abdominal or pelvic lymph nodes. Reproductive: Prostate is unremarkable. Other: No abdominal wall hernia or abnormality. No abdominopelvic ascites. MUSCULOSKELETAL FINDINGS No acute or significant osseous findings. IMPRESSION: 1. No acute findings within the chest, abdomen or pelvis. 2. No evidence of malignancy.  No adenopathy or splenomegaly. 3. 6 mm low-density liver lesion. In this patient's age group, this can be considered benign based on the ACR consensus guidelines. No additional imaging recommended. 4. No other abnormalities within the chest, abdomen or pelvis. Electronically Signed   By: DLajean ManesM.D.   On: 07/23/2016 09:46     Assessment and plan- Patient is a 26y.o. male seeing me for autoimmune pancytopenia  Patient has responded well to steroids. His  WBC is now normal and platelet count is up to 130. 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 I have discussed his case with DR. Ma from [facility] and also made a referral for second opinion. She agrees with my  plan to proceed with second line rituxan. If he fails rituxan, he would need spelnectomy. In anticipation for possible splenectomy, we need to give him prophylactic vaccinations. We will proceed with PCV 13, meningococcal, hemophilus influenza today. Try to obtain prior immunization records. If he did not receive PCV23 prior, that cannot be given for atleast 8 weeks and he will need to continue steroids. Rituxan would kill his immune response to vaccines. Hence he needs to eb vaccinated prior to rituxan. MMR, TDAP and varicella vaccines will be given based on his prior immunization schedule. I will see him 2 weeks with cbc.   Total face to face encounter time for this patient visit was 30 min. >50% of the time was  spent in counseling and coordination of care.     Visit Diagnosis 1. Other pancytopenia ([place])      [name], MD, MPH [facility] at [facility] Pager- [phone] 07/29/2016 1:41 PM

## 2016-07-29 NOTE — Progress Notes (Signed)
Here for follow up

## 2016-07-30 ENCOUNTER — Other Ambulatory Visit: Payer: Self-pay | Admitting: *Deleted

## 2016-07-30 DIAGNOSIS — D61818 Other pancytopenia: Secondary | ICD-10-CM

## 2016-07-30 MED ORDER — PANTOPRAZOLE SODIUM 20 MG PO TBEC: 20.0000 mg | DELAYED_RELEASE_TABLET | Freq: Every day | ORAL | 1 refills | Status: DC

## 2016-08-11 ENCOUNTER — Other Ambulatory Visit: Payer: Self-pay | Admitting: *Deleted

## 2016-08-11 DIAGNOSIS — D61818 Other pancytopenia: Secondary | ICD-10-CM | POA: Insufficient documentation

## 2016-08-11 NOTE — Progress Notes (Signed)
Hematology/Oncology Consult note [facility]  [phone] [phone]  Patient Care Team: [name], MD as PCP - General (Internal Medicine)   Name of the patient: Bobby Dean  [id number]  Jun 11, 1990   Date of visit: 08/11/16   Chief complaint/ Reason for visit- routine f/u  Heme/Onc history: Patient is a 26 year old male was then referred to [place] for evaluation and management of thrombocytopenia. He has a history of autism and intellectual visibility and currently lives in a group home for the last 7 months. His medical decision maker is [name] from [place] office.   Blood work from 04/28/2016 showed white count of 2.1 with an H&H of 15.5/45.1 and a platelet count of 51. CMP was within normal limits. TSH was normal at 1.09. At this time I do not have any prior CBCs for comparison.  Patient is here with his group home Asst. [name] today who reports that patient has been doing well over the last 7 months and has no known medical problems. He does not take any medications or over-the-counter medications. He has been feeling well and has had no sick days and denies any complaints.Patient is comfortable and is able to give Limited history and denies any complaints at this time his mother died of cancer (unknown which one). Other family history is not known.  Bone marrow biopsy on 05/14/2016 showed normal trilineage hematopoiesis with no evidence of lymphoma or leukemia. Peripheral flow cytometry showed reversal of CD4 to CD8 ratio. HIV hepatitis B and hepatitis C testing was negative. B12 and folate were within normal limits. PT/PTT INR was within normal limits.  Review of peripheral smear showed leukopenia and moderate neutropenia. Thrombocytopenia. RBC morphology is normal. Scattered reactive appearing lymphocytes are seen. No blasts or early forms noted.  Monospot testing was positive. ANA was negative.  Quantitative immunoglobulins were within normal limits. Serum copper was within normal limits. CMV DNA PCR negative  Patient was started on empiric high dose steroids for possible autoimmune etiology on 05/28/16. His counts normalized. Then his steroid taper was started. His WBC remained normal but his platelets began to decline 60's.   Plan was to restart full dose steroids and switch to rituxan second line after receiving pre splenectomy vaccines in anticipation of splenectomy should he not respond to rituxan  Interval history- he is on 55 mg prednisone. Tolerating it well without side effects  ECOG PS- 0 Pain scale- 0   Review of systems- Review of Systems  Constitutional: Negative for chills, fever, malaise/fatigue and weight loss.  HENT: Negative for congestion, ear discharge and nosebleeds.   Eyes: Negative for blurred vision.  Respiratory: Negative for cough, hemoptysis, sputum production, shortness of breath and wheezing.   Cardiovascular: Negative for chest pain, palpitations, orthopnea and claudication.  Gastrointestinal: Negative for abdominal pain, blood in stool, constipation, diarrhea, heartburn, melena, nausea and vomiting.  Genitourinary: Negative for dysuria, flank pain, frequency, hematuria and urgency.  Musculoskeletal: Negative for back pain, joint pain and myalgias.  Skin: Negative for rash.  Neurological: Negative for dizziness, tingling, focal weakness, seizures, weakness and headaches.  Endo/Heme/Allergies: Does not bruise/bleed easily.  Psychiatric/Behavioral: Negative for depression and suicidal ideas. The patient does not have insomnia.      Current treatment- prednisone 55 mg  No Known Allergies   Past Medical History:  Diagnosis Date  . Anxiety   . Autism   . Autism      No past surgical history on file.  Social History   Social History  . Marital status: Single    Spouse name: N/A  . Number of children: N/A  . Years of education: N/A    Occupational History  . Not on file.   Social History Main Topics  . Smoking status: Never Smoker  . Smokeless tobacco: Never Used  . Alcohol use No  . Drug use: No  . Sexual activity: Not on file   Other Topics Concern  . Not on file   Social History Narrative  . No narrative on file    Family History  Problem Relation Age of Onset  . Cancer Mother      Current Outpatient Prescriptions:  .  pantoprazole (PROTONIX) 20 MG tablet, Take 1 tablet (20 mg total) by mouth daily., Disp: 30 tablet, Rfl: 1 .  predniSONE (DELTASONE) 10 MG tablet, Take 5.5 tablets (55 mg total) by mouth daily with breakfast. 55 mg daily, Disp: 164 tablet, Rfl: 0 .  sulfamethoxazole-trimethoprim (BACTRIM DS,SEPTRA DS) 800-160 MG tablet, Take 1 tablet by mouth 3 (three) times a week., Disp: 24 tablet, Rfl: 0  Physical exam:  Vitals:   08/12/16 1111  BP: (!) 145/94  Pulse: 70  Resp: 18  Temp: 98.6 F (37 C)  TempSrc: Oral  Weight: 183 lb 14.4 oz (83.4 kg)   Physical Exam  Constitutional: He is oriented to person, place, and time and well-developed, well-nourished, and in no distress.  HENT:  Head: Normocephalic and atraumatic.  Mouth/Throat: Oropharynx is clear and moist.  Eyes: EOM are normal. Pupils are equal, round, and reactive to light.  Neck: Normal range of motion.  Cardiovascular: Normal rate, regular rhythm and normal heart sounds.   Pulmonary/Chest: Effort normal and breath sounds normal.  Abdominal: Soft. Bowel sounds are normal.  Neurological: He is alert and oriented to person, place, and time.  Skin: Skin is warm and dry.     No flowsheet data found. CBC Latest Ref Rng & Units 08/12/2016  WBC 3.8 - 10.6 K/uL 6.9  Hemoglobin 13.0 - 18.0 g/dL 16.0  Hematocrit 40.0 - 52.0 % 45.9  Platelets 150 - 440 K/uL 73(L)    No images are attached to the encounter.  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 Oncology notes: Patient is a 26 year old male was then referred to [place] for evaluation and management of thrombocytopenia. He has a history of autism and intellectual visibility and currently lives in a group home for the last 7 months. Blood work from 04/28/2016 showed white count of 2.1 with an H&H of 15.5/45.1 and a platelet count of 51. CMP was within normal limits. TSH was normal at 1.09. EXAM: CT CHEST, ABDOMEN, AND PELVIS WITH CONTRAST TECHNIQUE: Multidetector CT imaging of the chest, abdomen and pelvis was performed following the standard protocol during bolus administration of intravenous contrast. CONTRAST:  114m ISOVUE-300 IOPAMIDOL (ISOVUE-300) INJECTION 61% COMPARISON:  Abdomen ultrasound, 06/07/2016. FINDINGS: CT CHEST FINDINGS Cardiovascular: No significant vascular findings. Normal heart size. No pericardial effusion. Great vessels are within normal limits. Mediastinum/Nodes: No enlarged mediastinal, hilar, or axillary lymph nodes. Thyroid gland, trachea, and esophagus demonstrate no significant findings. Lungs/Pleura: Lungs are clear. No pleural effusion or pneumothorax. CT ABDOMEN PELVIS FINDINGS Hepatobiliary: 6 mm low-density lesion in segment 7, nonspecific, likely a cyst or small hemangioma. No other liver abnormality. Normal gallbladder. No bile duct dilation. Pancreas: Unremarkable. No pancreatic ductal dilatation or surrounding inflammatory changes. Spleen:  Spleen is homogeneous in attenuation. No mass or focal lesion. Spleen measures 13 x 6 x 12 cm with a calculated volume of 573 mL, within the normal range. Adrenals/Urinary Tract: Adrenal glands are unremarkable. Kidneys are normal, without renal calculi, focal lesion, or hydronephrosis. Bladder is unremarkable. Stomach/Bowel: Stomach is within normal limits. Appendix appears normal. No evidence of bowel wall thickening, distention, or inflammatory changes. Vascular/Lymphatic: No significant vascular  findings are present. No enlarged abdominal or pelvic lymph nodes. Reproductive: Prostate is unremarkable. Other: No abdominal wall hernia or abnormality. No abdominopelvic ascites. MUSCULOSKELETAL FINDINGS No acute or significant osseous findings. IMPRESSION: 1. No acute findings within the chest, abdomen or pelvis. 2. No evidence of malignancy.  No adenopathy or splenomegaly. 3. 6 mm low-density liver lesion. In this patient's age group, this can be considered benign based on the ACR consensus guidelines. No additional imaging recommended. 4. No other abnormalities within the chest, abdomen or pelvis. Electronically Signed   By: Lajean Manes M.D.   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 Oncology notes: Patient is a 26 year old male was then referred to [place] for evaluation and management of thrombocytopenia. He has a history of autism and intellectual visibility and currently lives in a group home for the last 7 months. Blood work from 04/28/2016 showed white count of 2.1 with an H&H of 15.5/45.1 and a platelet count of 51. CMP was within normal limits. TSH was normal at 1.09. EXAM: CT CHEST, ABDOMEN, AND PELVIS WITH CONTRAST TECHNIQUE: Multidetector CT imaging of the chest, abdomen and pelvis was performed following the standard protocol during bolus administration of intravenous contrast. CONTRAST:  117m ISOVUE-300 IOPAMIDOL (ISOVUE-300) INJECTION 61% COMPARISON:  Abdomen ultrasound, 06/07/2016. FINDINGS: CT CHEST FINDINGS Cardiovascular: No significant vascular findings. Normal heart size. No pericardial effusion. Great vessels are within normal limits. Mediastinum/Nodes: No enlarged mediastinal, hilar, or axillary lymph nodes. Thyroid gland, trachea, and esophagus demonstrate no significant findings. Lungs/Pleura: Lungs are clear. No pleural effusion or pneumothorax. CT  ABDOMEN PELVIS FINDINGS Hepatobiliary: 6 mm low-density lesion in segment 7, nonspecific, likely a cyst or small hemangioma. No other liver abnormality. Normal gallbladder. No bile duct dilation. Pancreas: Unremarkable. No pancreatic ductal dilatation or surrounding inflammatory changes. Spleen: Spleen is homogeneous in attenuation. No mass or focal lesion. Spleen measures 13 x 6 x 12 cm with a calculated volume of 573 mL, within the normal range. Adrenals/Urinary Tract: Adrenal glands are unremarkable. Kidneys are normal, without renal calculi, focal lesion, or hydronephrosis. Bladder is unremarkable. Stomach/Bowel: Stomach is within normal limits. Appendix appears normal. No evidence of bowel wall thickening, distention, or inflammatory changes. Vascular/Lymphatic: No significant vascular findings are present. No enlarged abdominal or pelvic lymph nodes. Reproductive: Prostate is unremarkable. Other: No abdominal wall hernia or abnormality. No abdominopelvic ascites. MUSCULOSKELETAL FINDINGS No acute or significant osseous findings. IMPRESSION: 1. No acute findings within the chest, abdomen or pelvis. 2. No evidence of malignancy.  No adenopathy or splenomegaly. 3. 6 mm low-density liver lesion. In this patient's age group, this can be considered benign based on the ACR consensus guidelines. No additional imaging recommended. 4. No other abnormalities within the chest, abdomen or pelvis. Electronically Signed   By: DLajean ManesM.D.   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 Continue pcp prophylaxis with  bactrim  He has received pcv 13, hemophilus and meninococcal vaccine so far. We received his prior vaccination records. His last tdap was in 2002. He will get 1 more dose today along with varicella. He has received mmr and polio. Will receive pcv 23 in mid July 2018 and then start rituxan end of July.  Continue steroids until then  rtc in 1 month with labs   Visit Diagnosis 1. 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, MD, MPH [facility] Pager- [phone] 08/12/2016

## 2016-08-12 ENCOUNTER — Inpatient Hospital Stay: Payer: Medicare Other

## 2016-08-12 ENCOUNTER — Encounter: Payer: Self-pay | Admitting: Oncology

## 2016-08-12 ENCOUNTER — Inpatient Hospital Stay (HOSPITAL_BASED_OUTPATIENT_CLINIC_OR_DEPARTMENT_OTHER): Payer: Medicare Other | Admitting: Oncology

## 2016-08-12 VITALS — BP 145/94 | HR 70 | Temp 98.6°F | Resp 18 | Wt 183.9 lb

## 2016-08-12 DIAGNOSIS — Z79899 Other long term (current) drug therapy: Secondary | ICD-10-CM | POA: Diagnosis not present

## 2016-08-12 DIAGNOSIS — F419 Anxiety disorder, unspecified: Secondary | ICD-10-CM

## 2016-08-12 DIAGNOSIS — D61818 Other pancytopenia: Secondary | ICD-10-CM

## 2016-08-12 DIAGNOSIS — F840 Autistic disorder: Secondary | ICD-10-CM | POA: Diagnosis not present

## 2016-08-12 DIAGNOSIS — D696 Thrombocytopenia, unspecified: Secondary | ICD-10-CM

## 2016-08-12 DIAGNOSIS — Z299 Encounter for prophylactic measures, unspecified: Secondary | ICD-10-CM

## 2016-08-12 LAB — COMPREHENSIVE METABOLIC PANEL
ALBUMIN: 4.7 g/dL (ref 3.5–5.0)
ALT: 13 U/L — AB (ref 17–63)
AST: 18 U/L (ref 15–41)
Alkaline Phosphatase: 46 U/L (ref 38–126)
Anion gap: 7 (ref 5–15)
BUN: 12 mg/dL (ref 6–20)
CHLORIDE: 100 mmol/L — AB (ref 101–111)
CO2: 31 mmol/L (ref 22–32)
CREATININE: 0.75 mg/dL (ref 0.61–1.24)
Calcium: 9.5 mg/dL (ref 8.9–10.3)
GFR calc Af Amer: >60 mL/min (ref >60)
GFR calc non Af Amer: >60 mL/min (ref >60)
GLUCOSE: 75 mg/dL (ref 65–99)
POTASSIUM: 3.5 mmol/L (ref 3.5–5.1)
SODIUM: 138 mmol/L (ref 135–145)
Total Bilirubin: 1 mg/dL (ref 0.3–1.2)
Total Protein: 7.8 g/dL (ref 6.5–8.1)

## 2016-08-12 LAB — CBC WITH DIFFERENTIAL/PLATELET
BASOS ABS: 0 10*3/uL (ref 0–0.1)
BASOS PCT: 0 %
Eosinophils Absolute: 0 10*3/uL (ref 0–0.7)
Eosinophils Relative: 0 %
HCT: 45.9 % (ref 40.0–52.0)
Hemoglobin: 16 g/dL (ref 13.0–18.0)
LYMPHS ABS: 0.6 10*3/uL — AB (ref 1.0–3.6)
Lymphocytes Relative: 9 %
MCH: 29.2 pg (ref 26.0–34.0)
MCHC: 35 g/dL (ref 32.0–36.0)
MCV: 83.6 fL (ref 80.0–100.0)
MONO ABS: 0.1 10*3/uL — AB (ref 0.2–1.0)
MONOS PCT: 2 %
NEUTROS ABS: 6.1 10*3/uL (ref 1.4–6.5)
NEUTROS PCT: 89 %
PLATELETS: 73 10*3/uL — AB (ref 150–440)
RBC: 5.49 MIL/uL (ref 4.40–5.90)
RDW: 13.5 % (ref 11.5–14.5)
WBC: 6.9 10*3/uL (ref 3.8–10.6)

## 2016-08-12 MED ORDER — TETANUS-DIPHTH-ACELL PERTUSSIS 5-2.5-18.5 LF-MCG/0.5 IM SUSP
0.5000 mL | Freq: Once | INTRAMUSCULAR | Status: AC
  Administered 2016-08-12: 0.5 mL via INTRAMUSCULAR
  Filled 2016-08-12: qty 0.5

## 2016-08-12 MED ORDER — SULFAMETHOXAZOLE-TRIMETHOPRIM 800-160 MG PO TABS: 1.0000 | ORAL_TABLET | ORAL | 0 refills | Status: DC

## 2016-08-12 MED ORDER — VARICELLA VIRUS VACCINE LIVE 1350 PFU/0.5ML IJ SUSR
0.5000 mL | Freq: Once | INTRAMUSCULAR | Status: AC
  Administered 2016-08-12: 0.5 mL via SUBCUTANEOUS
  Filled 2016-08-12: qty 0.5

## 2016-08-12 NOTE — Progress Notes (Signed)
Here for follow up. Doing well per [employer] staff. Able to do a little exercising

## 2016-08-25 ENCOUNTER — Other Ambulatory Visit: Payer: Self-pay | Admitting: Oncology

## 2016-08-25 DIAGNOSIS — D61818 Other pancytopenia: Secondary | ICD-10-CM

## 2016-09-07 ENCOUNTER — Telehealth: Payer: Self-pay | Admitting: *Deleted

## 2016-09-07 DIAGNOSIS — D61818 Other pancytopenia: Secondary | ICD-10-CM

## 2016-09-07 MED ORDER — PREDNISONE 10 MG PO TABS: ORAL_TABLET | ORAL | 0 refills | Status: DC

## 2016-09-07 MED ORDER — PANTOPRAZOLE SODIUM 20 MG PO TBEC: 20.0000 mg | DELAYED_RELEASE_TABLET | Freq: Every day | ORAL | 1 refills | Status: DC

## 2016-09-07 MED ORDER — SULFAMETHOXAZOLE-TRIMETHOPRIM 800-160 MG PO TABS: 1.0000 | ORAL_TABLET | ORAL | 0 refills | Status: DC

## 2016-09-07 NOTE — Telephone Encounter (Signed)
Bobby Dean called to say that she needs refills of protonix and atb. She is not out of prednisone but it is getting close.  I spoke to rao and she is ok with refill all three. Then I [facility] Dr. Porfirio Oarbrandi Reeves. 2nd floor oncology.  She should get a packet in the mail. Bobby Dean will check at the P O Box and if she does not see a packet then she will cal me back and I can get address.

## 2016-09-14 ENCOUNTER — Other Ambulatory Visit: Payer: Self-pay | Admitting: *Deleted

## 2016-09-14 ENCOUNTER — Inpatient Hospital Stay: Payer: Medicare Other

## 2016-09-14 ENCOUNTER — Encounter: Payer: Self-pay | Admitting: Oncology

## 2016-09-14 ENCOUNTER — Inpatient Hospital Stay: Payer: Medicare Other | Attending: Oncology | Admitting: Oncology

## 2016-09-14 VITALS — BP 127/83 | HR 64 | Temp 97.9°F | Resp 18 | Wt 180.4 lb

## 2016-09-14 DIAGNOSIS — Z23 Encounter for immunization: Secondary | ICD-10-CM | POA: Diagnosis not present

## 2016-09-14 DIAGNOSIS — F840 Autistic disorder: Secondary | ICD-10-CM | POA: Diagnosis not present

## 2016-09-14 DIAGNOSIS — Z79899 Other long term (current) drug therapy: Secondary | ICD-10-CM | POA: Diagnosis not present

## 2016-09-14 DIAGNOSIS — F419 Anxiety disorder, unspecified: Secondary | ICD-10-CM | POA: Diagnosis not present

## 2016-09-14 DIAGNOSIS — D61818 Other pancytopenia: Secondary | ICD-10-CM | POA: Insufficient documentation

## 2016-09-14 LAB — CBC WITH DIFFERENTIAL/PLATELET
BASOS ABS: 0 10*3/uL (ref 0–0.1)
BASOS PCT: 0 %
EOS PCT: 0 %
Eosinophils Absolute: 0 10*3/uL (ref 0–0.7)
HCT: 45.1 % (ref 40.0–52.0)
Hemoglobin: 15.7 g/dL (ref 13.0–18.0)
Lymphocytes Relative: 13 %
Lymphs Abs: 0.7 10*3/uL — ABNORMAL LOW (ref 1.0–3.6)
MCH: 29.3 pg (ref 26.0–34.0)
MCHC: 34.9 g/dL (ref 32.0–36.0)
MCV: 83.9 fL (ref 80.0–100.0)
MONO ABS: 0.2 10*3/uL (ref 0.2–1.0)
MONOS PCT: 3 %
Neutro Abs: 4.5 10*3/uL (ref 1.4–6.5)
Neutrophils Relative %: 84 %
PLATELETS: 96 10*3/uL — AB (ref 150–440)
RBC: 5.38 MIL/uL (ref 4.40–5.90)
RDW: 14.1 % (ref 11.5–14.5)
WBC: 5.4 10*3/uL (ref 3.8–10.6)

## 2016-09-14 MED ORDER — CALCIUM 600-400 MG-UNIT PO CHEW: 1.0000 | CHEWABLE_TABLET | Freq: Two times a day (BID) | ORAL | 2 refills | Status: DC

## 2016-09-14 NOTE — Progress Notes (Signed)
Patient offers no complaints today. 

## 2016-09-14 NOTE — Progress Notes (Signed)
Hematology/Oncology Consult note [facility]  Telephone:([phone] [phone]  Patient Care Team: [name], MD as PCP - General (Internal Medicine)   Name of the patient: Clavin Ruhlman  [id number]  Oct 30, 1990   Date of visit: 09/14/16  Diagnosis- autoimmune neutropenia and thrombocytopenia  Chief complaint/ Reason for visit- routine f/u  Heme/Onc history: Patient is a 26 year old male was then referred to [place] for evaluation and management of thrombocytopenia. He has a history of autism and intellectual visibility and currently lives in a group home for the last 7 months. His medical decision maker is [name] from [place] office.   Blood work from 04/28/2016 showed white count of 2.1 with an H&H of 15.5/45.1 and a platelet count of 51. CMP was within normal limits. TSH was normal at 1.09. At this time I do not have any prior CBCs for comparison.  Patient is here with his group home Asst. [name] today who reports that patient has been doing well over the last 7 months and has no known medical problems. He does not take any medications or over-the-counter medications. He has been feeling well and has had no sick days and denies any complaints.Patient is comfortable and is able to give Limited history and denies any complaints at this time his mother died of cancer (unknown which one). Other family history is not known.  Bone marrow biopsy on 05/14/2016 showed normal trilineage hematopoiesis with no evidence of lymphoma or leukemia. Peripheral flow cytometry showed reversal of CD4 to CD8 ratio. HIV hepatitis B and hepatitis C testing was negative. B12 and folate were within normal limits. PT/PTT INR was within normal limits.  Review of peripheral smear showed leukopenia and moderate neutropenia. Thrombocytopenia. RBC morphology is normal. Scattered reactive appearing lymphocytes are seen. No blasts or early forms  noted.  Monospot testing was positive. ANA was negative. Quantitative immunoglobulins were within normal limits. Serum copper was within normal limits. CMV DNA PCR negative  Patient was started on empiric high dose steroids for possible autoimmune etiology on 05/28/16. His counts normalized. Then his steroid taper was started. His WBC remained normal but his platelets began to decline 60's.   Plan was to restart full dose steroids and switch to rituxan second line after receiving pre splenectomy vaccines in anticipation of splenectomy should he not respond to rituxan   Interval history- he is tolerating steroids well. No complaints. He has lost about 10 pounds of weight over last 3-4 months  ECOG PS- 0 Pain scale- 0   Review of systems- Review of Systems  Constitutional: Negative for chills, fever, malaise/fatigue and weight loss.  HENT: Negative for congestion, ear discharge and nosebleeds.   Eyes: Negative for blurred vision.  Respiratory: Negative for cough, hemoptysis, sputum production, shortness of breath and wheezing.   Cardiovascular: Negative for chest pain, palpitations, orthopnea and claudication.  Gastrointestinal: Negative for abdominal pain, blood in stool, constipation, diarrhea, heartburn, melena, nausea and vomiting.  Genitourinary: Negative for dysuria, flank pain, frequency, hematuria and urgency.  Musculoskeletal: Negative for back pain, joint pain and myalgias.  Skin: Negative for rash.  Neurological: Negative for dizziness, tingling, focal weakness, seizures, weakness and headaches.  Endo/Heme/Allergies: Does not bruise/bleed easily.  Psychiatric/Behavioral: Negative for depression and suicidal ideas. The patient does not have insomnia.       No Known Allergies   Past Medical History:  Diagnosis Date  . Anxiety   . Autism   . Autism  No past surgical history on file.  Social History   Social History  . Marital status: Single    Spouse name:  N/A  . Number of children: N/A  . Years of education: N/A   Occupational History  . Not on file.   Social History Main Topics  . Smoking status: Never Smoker  . Smokeless tobacco: Never Used  . Alcohol use No  . Drug use: No  . Sexual activity: Not on file   Other Topics Concern  . Not on file   Social History Narrative  . No narrative on file    Family History  Problem Relation Age of Onset  . Cancer Mother      Current Outpatient Prescriptions:  .  pantoprazole (PROTONIX) 20 MG tablet, Take 1 tablet (20 mg total) by mouth daily., Disp: 30 tablet, Rfl: 1 .  predniSONE (DELTASONE) 10 MG tablet, TAKE 5 &1/2 TABLETS ONCE DAILY WITH BREAKFAST., Disp: 164 tablet, Rfl: 0 .  sulfamethoxazole-trimethoprim (BACTRIM DS,SEPTRA DS) 800-160 MG tablet, Take 1 tablet by mouth 3 (three) times a week., Disp: 24 tablet, Rfl: 0 .  Calcium 600-400 MG-UNIT CHEW, Chew 1 tablet by mouth 2 (two) times daily., Disp: 60 tablet, Rfl: 2  Physical exam:  Vitals:   09/14/16 1033  BP: 127/83  Pulse: 64  Resp: 18  Temp: 97.9 F (36.6 C)  TempSrc: Tympanic  Weight: 180 lb 6 oz (81.8 kg)   Physical Exam  Constitutional: He is oriented to person, place, and time and well-developed, well-nourished, and in no distress.  HENT:  Head: Normocephalic and atraumatic.  Eyes: EOM are normal. Pupils are equal, round, and reactive to light.  Neck: Normal range of motion.  Cardiovascular: Normal rate, regular rhythm and normal heart sounds.   Pulmonary/Chest: Effort normal and breath sounds normal.  Abdominal: Soft. Bowel sounds are normal.  Neurological: He is alert and oriented to person, place, and time.  Skin: Skin is warm and dry.     CMP Latest Ref Rng & Units 08/12/2016  Glucose 65 - 99 mg/dL 75  BUN 6 - 20 mg/dL 12  Creatinine 0.61 - 1.24 mg/dL 0.75  Sodium 135 - 145 mmol/L 138  Potassium 3.5 - 5.1 mmol/L 3.5  Chloride 101 - 111 mmol/L 100(L)  CO2 22 - 32 mmol/L 31  Calcium 8.9 - 10.3 mg/dL  9.5  Total Protein 6.5 - 8.1 g/dL 7.8  Total Bilirubin 0.3 - 1.2 mg/dL 1.0  Alkaline Phos 38 - 126 U/L 46  AST 15 - 41 U/L 18  ALT 17 - 63 U/L 13(L)   CBC Latest Ref Rng & Units 09/14/2016  WBC 3.8 - 10.6 K/uL 5.4  Hemoglobin 13.0 - 18.0 g/dL 15.7  Hematocrit 40.0 - 52.0 % 45.1  Platelets 150 - 440 K/uL 96(L)     Assessment and plan- 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 Continue pcp prophylaxis with bactrim. Given that he has been on high dose steroids for >3 months- will add calcium 1200 mg and Vit D 800 IU to prevent steroid induced osteoporosis. Hopefully we can get him off steroids soon after we start rituxan  PCV23 vaccine on 7/19. Will see me 1 week after to start Rituxan weekly X 4 doses  He is scheduled to see [name] from [facility] for second opinion in 2 weeks     Visit Diagnosis 1. 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,  MD, MPH [place] at [facility] Pager- [phone] 09/14/2016 12:57 PM

## 2016-09-23 ENCOUNTER — Ambulatory Visit: Payer: Medicare Other

## 2016-09-27 DIAGNOSIS — D693 Immune thrombocytopenic purpura: Secondary | ICD-10-CM | POA: Diagnosis not present

## 2016-09-27 DIAGNOSIS — Z809 Family history of malignant neoplasm, unspecified: Secondary | ICD-10-CM | POA: Diagnosis not present

## 2016-09-27 DIAGNOSIS — D72819 Decreased white blood cell count, unspecified: Secondary | ICD-10-CM | POA: Diagnosis not present

## 2016-09-27 DIAGNOSIS — Z87898 Personal history of other specified conditions: Secondary | ICD-10-CM | POA: Diagnosis not present

## 2016-09-27 DIAGNOSIS — D61818 Other pancytopenia: Secondary | ICD-10-CM | POA: Diagnosis not present

## 2016-09-27 DIAGNOSIS — F840 Autistic disorder: Secondary | ICD-10-CM | POA: Diagnosis not present

## 2016-09-27 DIAGNOSIS — D709 Neutropenia, unspecified: Secondary | ICD-10-CM | POA: Diagnosis not present

## 2016-09-27 DIAGNOSIS — R591 Generalized enlarged lymph nodes: Secondary | ICD-10-CM | POA: Diagnosis not present

## 2016-09-27 DIAGNOSIS — D696 Thrombocytopenia, unspecified: Secondary | ICD-10-CM | POA: Diagnosis not present

## 2016-09-30 ENCOUNTER — Other Ambulatory Visit: Payer: Self-pay | Admitting: *Deleted

## 2016-09-30 ENCOUNTER — Inpatient Hospital Stay: Payer: Medicare Other

## 2016-09-30 DIAGNOSIS — Z79899 Other long term (current) drug therapy: Secondary | ICD-10-CM | POA: Diagnosis not present

## 2016-09-30 DIAGNOSIS — D61818 Other pancytopenia: Secondary | ICD-10-CM

## 2016-09-30 DIAGNOSIS — F419 Anxiety disorder, unspecified: Secondary | ICD-10-CM | POA: Diagnosis not present

## 2016-09-30 DIAGNOSIS — Z23 Encounter for immunization: Secondary | ICD-10-CM

## 2016-09-30 DIAGNOSIS — Z299 Encounter for prophylactic measures, unspecified: Secondary | ICD-10-CM

## 2016-09-30 DIAGNOSIS — F840 Autistic disorder: Secondary | ICD-10-CM | POA: Diagnosis not present

## 2016-09-30 MED ORDER — PREDNISONE 10 MG PO TABS: ORAL_TABLET | ORAL | 0 refills | Status: DC

## 2016-09-30 MED ORDER — PNEUMOCOCCAL VAC POLYVALENT 25 MCG/0.5ML IJ INJ
0.5000 mL | INJECTION | Freq: Once | INTRAMUSCULAR | Status: AC
  Administered 2016-09-30: 0.5 mL via INTRAMUSCULAR
  Filled 2016-09-30: qty 0.5

## 2016-09-30 MED ORDER — MENINGOCOCCAL A C Y&W-135 OLIG IM SOLR
0.5000 mL | Freq: Once | INTRAMUSCULAR | Status: AC
  Administered 2016-09-30: 0.5 mL via INTRAMUSCULAR
  Filled 2016-09-30: qty 0.5

## 2016-10-01 ENCOUNTER — Telehealth: Payer: Self-pay | Admitting: *Deleted

## 2016-10-01 NOTE — Telephone Encounter (Signed)
Bobby FailWendy ran into Bobby Dean who is the guardian for [place] [facility].  He gave the the consent to Bobby FailWendy to give to me that is signed for pt to start rituxan in 2 weeks. I will send it to be filed

## 2016-10-07 ENCOUNTER — Ambulatory Visit: Payer: Medicare Other

## 2016-10-07 ENCOUNTER — Other Ambulatory Visit: Payer: Medicare Other

## 2016-10-07 ENCOUNTER — Ambulatory Visit: Payer: Medicare Other | Admitting: Oncology

## 2016-10-14 ENCOUNTER — Inpatient Hospital Stay: Payer: Medicare Other | Attending: Oncology | Admitting: Oncology

## 2016-10-14 ENCOUNTER — Inpatient Hospital Stay: Payer: Medicare Other

## 2016-10-14 ENCOUNTER — Encounter: Payer: Self-pay | Admitting: Oncology

## 2016-10-14 ENCOUNTER — Ambulatory Visit: Payer: Medicare Other

## 2016-10-14 ENCOUNTER — Other Ambulatory Visit: Payer: Self-pay

## 2016-10-14 VITALS — BP 125/86 | HR 67 | Temp 97.3°F | Resp 18 | Wt 182.5 lb

## 2016-10-14 DIAGNOSIS — M359 Systemic involvement of connective tissue, unspecified: Secondary | ICD-10-CM | POA: Diagnosis not present

## 2016-10-14 DIAGNOSIS — D61818 Other pancytopenia: Secondary | ICD-10-CM

## 2016-10-14 DIAGNOSIS — Z79899 Other long term (current) drug therapy: Secondary | ICD-10-CM | POA: Insufficient documentation

## 2016-10-14 DIAGNOSIS — Z9081 Acquired absence of spleen: Secondary | ICD-10-CM | POA: Diagnosis not present

## 2016-10-14 DIAGNOSIS — F419 Anxiety disorder, unspecified: Secondary | ICD-10-CM

## 2016-10-14 DIAGNOSIS — Z5112 Encounter for antineoplastic immunotherapy: Secondary | ICD-10-CM | POA: Diagnosis not present

## 2016-10-14 DIAGNOSIS — Z79620 Long term (current) use of immunosuppressive biologic: Secondary | ICD-10-CM

## 2016-10-14 DIAGNOSIS — F840 Autistic disorder: Secondary | ICD-10-CM

## 2016-10-14 DIAGNOSIS — Z5181 Encounter for therapeutic drug level monitoring: Secondary | ICD-10-CM

## 2016-10-14 LAB — CBC
HCT: 43.2 % (ref 40.0–52.0)
HEMOGLOBIN: 15.1 g/dL (ref 13.0–18.0)
MCH: 29.5 pg (ref 26.0–34.0)
MCHC: 35 g/dL (ref 32.0–36.0)
MCV: 84.2 fL (ref 80.0–100.0)
Platelets: 151 10*3/uL (ref 150–440)
RBC: 5.13 MIL/uL (ref 4.40–5.90)
RDW: 13.9 % (ref 11.5–14.5)
WBC: 5.6 10*3/uL (ref 3.8–10.6)

## 2016-10-14 LAB — COMPREHENSIVE METABOLIC PANEL
ALK PHOS: 38 U/L (ref 38–126)
ALT: 12 U/L — AB (ref 17–63)
AST: 15 U/L (ref 15–41)
Albumin: 4.1 g/dL (ref 3.5–5.0)
Anion gap: 6 (ref 5–15)
BUN: 12 mg/dL (ref 6–20)
CALCIUM: 9.2 mg/dL (ref 8.9–10.3)
CO2: 31 mmol/L (ref 22–32)
CREATININE: 0.74 mg/dL (ref 0.61–1.24)
Chloride: 101 mmol/L (ref 101–111)
GFR calc non Af Amer: >60 mL/min (ref >60)
Glucose, Bld: 100 mg/dL — ABNORMAL HIGH (ref 65–99)
Potassium: 3.7 mmol/L (ref 3.5–5.1)
SODIUM: 138 mmol/L (ref 135–145)
Total Bilirubin: 0.7 mg/dL (ref 0.3–1.2)
Total Protein: 6.4 g/dL — ABNORMAL LOW (ref 6.5–8.1)

## 2016-10-14 MED ORDER — SODIUM CHLORIDE 0.9 % IV SOLN
Freq: Once | INTRAVENOUS | Status: AC
  Administered 2016-10-14: 10:00:00 via INTRAVENOUS
  Filled 2016-10-14: qty 1000

## 2016-10-14 MED ORDER — DIPHENHYDRAMINE HCL 25 MG PO CAPS
50.0000 mg | ORAL_CAPSULE | Freq: Once | ORAL | Status: AC
Start: 2016-10-14 — End: 2016-10-14
  Administered 2016-10-14: 50 mg via ORAL
  Filled 2016-10-14: qty 2

## 2016-10-14 MED ORDER — SODIUM CHLORIDE 0.9 % IV SOLN
375.0000 mg/m2 | Freq: Once | INTRAVENOUS | Status: AC
  Administered 2016-10-14: 800 mg via INTRAVENOUS
  Filled 2016-10-14: qty 50

## 2016-10-14 MED ORDER — ACETAMINOPHEN 325 MG PO TABS
650.0000 mg | ORAL_TABLET | Freq: Once | ORAL | Status: AC
  Administered 2016-10-14: 650 mg via ORAL
  Filled 2016-10-14: qty 2

## 2016-10-14 NOTE — Progress Notes (Signed)
Hematology/Oncology Consult note [facility]  Telephone:([phone] [phone]  Patient Care Team: [name], MD as PCP - General (Internal Medicine)   Name of the patient: Bobby Dean  [id number]  November 20, 1990   Date of visit: 10/14/16  Diagnosis- autoimmune neutropenia and thrombocytopenia  Chief complaint/ Reason for visit- on treatment assessment prior to cycle 1 of weekly rituxan  Heme/Onc history: Patient is a 26 year old male was then referred to [place] for evaluation and management of thrombocytopenia. He has a history of autism and intellectual visibility and currently lives in a group home for the last 7 months. His medical decision maker is [name] from [place] office.   Blood work from 04/28/2016 showed white count of 2.1 with an H&H of 15.5/45.1 and a platelet count of 51. CMP was within normal limits. TSH was normal at 1.09. At this time I do not have any prior CBCs for comparison.  Patient is here with his group home Asst. [name] today who reports that patient has been doing well over the last 7 months and has no known medical problems. He does not take any medications or over-the-counter medications. He has been feeling well and has had no sick days and denies any complaints.Patient is comfortable and is able to give Limited history and denies any complaints at this time his mother died of cancer (unknown which one). Other family history is not known.  Bone marrow biopsy on 05/14/2016 showed normal trilineage hematopoiesis with no evidence of lymphoma or leukemia. Peripheral flow cytometry showed reversal of CD4 to CD8 ratio. HIV hepatitis B and hepatitis C testing was negative. B12 and folate were within normal limits. PT/PTT INR was within normal limits.  Review of peripheral smear showed leukopenia and moderate neutropenia. Thrombocytopenia. RBC morphology is normal. Scattered reactive appearing lymphocytes  are seen. No blasts or early forms noted.  Monospot testing was positive. ANA was negative. Quantitative immunoglobulins were within normal limits. Serum copper was within normal limits. CMV DNA PCR negative  Patient was started on empiric high dose steroids for possible autoimmune etiology on 05/28/16. His counts normalized. Then his steroid taper was started. His WBC remained normal but his platelets began to decline 60's.   Plan was to restart full dose steroids and switch to rituxan second line after receiving pre splenectomy vaccines in anticipation of splenectomy should he not respond to rituxan  Patient has received all his pre splenectomy vaccines. He starts rituxan on 10/14/16. Seen at [facility] for second opinion by [name] who agrees with the plan. Plan is to taper steroids after week 3 of rituxan if counts stabilize   Interval history- doing well. Denies ay complaints  ECOG PS- 0 Pain scale- 0   Review of systems- Review of Systems  Constitutional: Negative for chills, fever, malaise/fatigue and weight loss.  HENT: Negative for congestion, ear discharge and nosebleeds.   Eyes: Negative for blurred vision.  Respiratory: Negative for cough, hemoptysis, sputum production, shortness of breath and wheezing.   Cardiovascular: Negative for chest pain, palpitations, orthopnea and claudication.  Gastrointestinal: Negative for abdominal pain, blood in stool, constipation, diarrhea, heartburn, melena, nausea and vomiting.  Genitourinary: Negative for dysuria, flank pain, frequency, hematuria and urgency.  Musculoskeletal: Negative for back pain, joint pain and myalgias.  Skin: Negative for rash.  Neurological: Negative for dizziness, tingling, focal weakness, seizures, weakness and headaches.  Endo/Heme/Allergies: Does not bruise/bleed easily.  Psychiatric/Behavioral: Negative for depression and suicidal ideas. The patient  does not have insomnia.      No Known Allergies   Past  Medical History:  Diagnosis Date  . Anxiety   . Autism   . Autism      No past surgical history on file.  Social History   Social History  . Marital status: Single    Spouse name: N/A  . Number of children: N/A  . Years of education: N/A   Occupational History  . Not on file.   Social History Main Topics  . Smoking status: Never Smoker  . Smokeless tobacco: Never Used  . Alcohol use No  . Drug use: No  . Sexual activity: Not on file   Other Topics Concern  . Not on file   Social History Narrative  . No narrative on file    Family History  Problem Relation Age of Onset  . Cancer Mother      Current Outpatient Prescriptions:  .  Calcium 600-400 MG-UNIT CHEW, Chew 1 tablet by mouth 2 (two) times daily., Disp: 60 tablet, Rfl: 2 .  pantoprazole (PROTONIX) 20 MG tablet, Take 1 tablet (20 mg total) by mouth daily., Disp: 30 tablet, Rfl: 1 .  predniSONE (DELTASONE) 10 MG tablet, TAKE 5 &1/2 TABLETS ONCE DAILY WITH BREAKFAST., Disp: 164 tablet, Rfl: 0 .  sulfamethoxazole-trimethoprim (BACTRIM DS,SEPTRA DS) 800-160 MG tablet, Take 1 tablet by mouth 3 (three) times a week., Disp: 24 tablet, Rfl: 0  Physical exam:  Vitals:   10/14/16 0911  BP: 125/86  Pulse: 67  Resp: 18  Temp: (!) 97.3 F (36.3 C)  TempSrc: Tympanic  Weight: 182 lb 8 oz (82.8 kg)   Physical Exam  Constitutional: He is oriented to person, place, and time and well-developed, well-nourished, and in no distress.  HENT:  Head: Normocephalic and atraumatic.  Eyes: Pupils are equal, round, and reactive to light. EOM are normal.  Neck: Normal range of motion.  Cardiovascular: Normal rate, regular rhythm and normal heart sounds.   Pulmonary/Chest: Effort normal and breath sounds normal.  Abdominal: Soft. Bowel sounds are normal.  Neurological: He is alert and oriented to person, place, and time.  Skin: Skin is warm and dry.     CMP Latest Ref Rng & Units 08/12/2016  Glucose 65 - 99 mg/dL 75  BUN 6  - 20 mg/dL 12  Creatinine 0.61 - 1.24 mg/dL 0.75  Sodium 135 - 145 mmol/L 138  Potassium 3.5 - 5.1 mmol/L 3.5  Chloride 101 - 111 mmol/L 100(L)  CO2 22 - 32 mmol/L 31  Calcium 8.9 - 10.3 mg/dL 9.5  Total Protein 6.5 - 8.1 g/dL 7.8  Total Bilirubin 0.3 - 1.2 mg/dL 1.0  Alkaline Phos 38 - 126 U/L 46  AST 15 - 41 U/L 18  ALT 17 - 63 U/L 13(L)   CBC Latest Ref Rng & Units 09/14/2016  WBC 3.8 - 10.6 K/uL 5.4  Hemoglobin 13.0 - 18.0 g/dL 15.7  Hematocrit 40.0 - 52.0 % 45.1  Platelets 150 - 440 K/uL 96(L)     Assessment and plan- Patient is a 26 y.o. 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  Continue high dose steroids. Continue protnix and pcp prophylaxis. He has received all his rpe splenectomy vaccinations and will proceed with cycle 1 of weekly rituxan today. Discussed risks and benefits of rituxan including all but not limited to risk of infusion reactions, low blood counts and risk fo infections. Patient understands and agrees to proceed as planned. I will see him back  in 1 weeks time with repeat cbc, cmp. Consent for rituxan has been signed by his HCP   Visit Diagnosis 1. Autoimmune pancytopenia ([place])   2. Encounter for monitoring rituximab therapy      [name], MD, MPH [facility] at [facility] Pager- [phone] 10/14/2016 11:58 AM

## 2016-10-14 NOTE — Progress Notes (Signed)
Patient offers no complaints today. 

## 2016-10-21 ENCOUNTER — Encounter: Payer: Self-pay | Admitting: Oncology

## 2016-10-21 ENCOUNTER — Inpatient Hospital Stay (HOSPITAL_BASED_OUTPATIENT_CLINIC_OR_DEPARTMENT_OTHER): Payer: Medicare Other | Admitting: Oncology

## 2016-10-21 ENCOUNTER — Inpatient Hospital Stay: Payer: Medicare Other

## 2016-10-21 VITALS — BP 128/83 | HR 66 | Temp 97.5°F | Resp 18 | Ht 74.8 in | Wt 178.3 lb

## 2016-10-21 DIAGNOSIS — D61818 Other pancytopenia: Secondary | ICD-10-CM

## 2016-10-21 DIAGNOSIS — F840 Autistic disorder: Secondary | ICD-10-CM | POA: Diagnosis not present

## 2016-10-21 DIAGNOSIS — Z79899 Other long term (current) drug therapy: Secondary | ICD-10-CM

## 2016-10-21 DIAGNOSIS — Z5112 Encounter for antineoplastic immunotherapy: Secondary | ICD-10-CM | POA: Diagnosis not present

## 2016-10-21 DIAGNOSIS — Z5181 Encounter for therapeutic drug level monitoring: Secondary | ICD-10-CM

## 2016-10-21 DIAGNOSIS — F419 Anxiety disorder, unspecified: Secondary | ICD-10-CM | POA: Diagnosis not present

## 2016-10-21 LAB — CBC WITH DIFFERENTIAL/PLATELET
Basophils Absolute: 0 10*3/uL (ref 0–0.1)
Basophils Relative: 0 %
EOS ABS: 0 10*3/uL (ref 0–0.7)
Eosinophils Relative: 0 %
HEMATOCRIT: 46 % (ref 40.0–52.0)
HEMOGLOBIN: 15.8 g/dL (ref 13.0–18.0)
LYMPHS ABS: 0.7 10*3/uL — AB (ref 1.0–3.6)
LYMPHS PCT: 13 %
MCH: 29.3 pg (ref 26.0–34.0)
MCHC: 34.4 g/dL (ref 32.0–36.0)
MCV: 85 fL (ref 80.0–100.0)
MONOS PCT: 2 %
Monocytes Absolute: 0.1 10*3/uL — ABNORMAL LOW (ref 0.2–1.0)
NEUTROS PCT: 85 %
Neutro Abs: 4.6 10*3/uL (ref 1.4–6.5)
Platelets: 176 10*3/uL (ref 150–440)
RBC: 5.41 MIL/uL (ref 4.40–5.90)
RDW: 13.6 % (ref 11.5–14.5)
WBC: 5.4 10*3/uL (ref 3.8–10.6)

## 2016-10-21 LAB — COMPREHENSIVE METABOLIC PANEL
ALK PHOS: 39 U/L (ref 38–126)
ALT: 12 U/L — ABNORMAL LOW (ref 17–63)
ANION GAP: 7 (ref 5–15)
AST: 15 U/L (ref 15–41)
Albumin: 4.2 g/dL (ref 3.5–5.0)
BILIRUBIN TOTAL: 0.8 mg/dL (ref 0.3–1.2)
BUN: 10 mg/dL (ref 6–20)
CALCIUM: 9.3 mg/dL (ref 8.9–10.3)
CO2: 31 mmol/L (ref 22–32)
CREATININE: 0.77 mg/dL (ref 0.61–1.24)
Chloride: 100 mmol/L — ABNORMAL LOW (ref 101–111)
Glucose, Bld: 102 mg/dL — ABNORMAL HIGH (ref 65–99)
Potassium: 3.8 mmol/L (ref 3.5–5.1)
SODIUM: 138 mmol/L (ref 135–145)
TOTAL PROTEIN: 7 g/dL (ref 6.5–8.1)

## 2016-10-21 MED ORDER — ACETAMINOPHEN 325 MG PO TABS
650.0000 mg | ORAL_TABLET | Freq: Once | ORAL | Status: AC
  Administered 2016-10-21: 650 mg via ORAL
  Filled 2016-10-21: qty 2

## 2016-10-21 MED ORDER — SODIUM CHLORIDE 0.9 % IV SOLN: 375.0000 mg/m2 | Freq: Once | INTRAVENOUS | Status: DC

## 2016-10-21 MED ORDER — DIPHENHYDRAMINE HCL 25 MG PO CAPS
50.0000 mg | ORAL_CAPSULE | Freq: Once | ORAL | Status: AC
  Administered 2016-10-21: 50 mg via ORAL
  Filled 2016-10-21: qty 2

## 2016-10-21 MED ORDER — SODIUM CHLORIDE 0.9 % IV SOLN
375.0000 mg/m2 | Freq: Once | INTRAVENOUS | Status: AC
  Administered 2016-10-21: 800 mg via INTRAVENOUS
  Filled 2016-10-21: qty 50

## 2016-10-21 MED ORDER — SODIUM CHLORIDE 0.9 % IV SOLN
Freq: Once | INTRAVENOUS | Status: AC
  Administered 2016-10-21: 12:00:00 via INTRAVENOUS
  Filled 2016-10-21: qty 1000

## 2016-10-21 NOTE — Progress Notes (Signed)
Hematology/Oncology Consult note [facility]  Telephone:([phone] [phone]  Patient Care Team: [name], MD as PCP - General (Internal Medicine)   Name of the patient: Bobby Dean  [id number]  [birth year]   Date of visit: 10/21/16  Diagnosis- autoimmune neutropenia and thrombocytopenia  Chief complaint/ Reason for visit- on treatment assessment prior to cycle 2 of weekly rituxan  Heme/Onc history:Patient is a 26 year old male was then referred to [place] for evaluation and management of thrombocytopenia. He has a history of autism and intellectual visibility and currently lives in a group home for the last 7 months. His medical decision maker is [name] from [place] office.   Blood work from 04/28/2016 showed white count of 2.1 with an H&H of 15.5/45.1 and a platelet count of 51. CMP was within normal limits. TSH was normal at 1.09. At this time I do not have any prior CBCs for comparison.  Patient is here with his group home Asst. [name] today who reports that patient has been doing well over the last 7 months and has no known medical problems. He does not take any medications or over-the-counter medications. He has been feeling well and has had no sick days and denies any complaints.Patient is comfortable and is able to give Limited history and denies any complaints at this time his mother died of cancer (unknown which one). Other family history is not known.  Bone marrow biopsy on 05/14/2016 showed normal trilineage hematopoiesis with no evidence of lymphoma or leukemia. Peripheral flow cytometry showed reversal of CD4 to CD8 ratio. HIV hepatitis B and hepatitis C testing was negative. B12 and folate were within normal limits. PT/PTT INR was within normal limits.  Review of peripheral smear showed leukopenia and moderate neutropenia. Thrombocytopenia. RBC morphology is normal. Scattered reactive appearing lymphocytes  are seen. No blasts or early forms noted.  Monospot testing was positive. ANA was negative. Quantitative immunoglobulins were within normal limits. Serum copper was within normal limits. CMV DNA PCR negative  Patient was started on empiric high dose steroids for possible autoimmune etiology on 05/28/16. His counts normalized. Then his steroid taper was started. His WBC remained normal but his platelets began to decline 60's.   Plan was to restart full dose steroids and switch to rituxan second line after receiving pre splenectomy vaccines in anticipation of splenectomy should he not respond to rituxan  Patient has received all his pre splenectomy vaccines. He starts rituxan on 10/14/16. Seen at [facility] for second opinion by [name] who agrees with the plan. Plan is to taper steroids after week 3 of rituxan if counts stabilize  Interval history- he is doing well. Denies any complaints today. Weight down by 2 pounds since last month  ECOG PS- 0 Pain scale- 0   Review of systems- Review of Systems  Constitutional: Negative for chills, fever, malaise/fatigue and weight loss.  HENT: Negative for congestion, ear discharge and nosebleeds.   Eyes: Negative for blurred vision.  Respiratory: Negative for cough, hemoptysis, sputum production, shortness of breath and wheezing.   Cardiovascular: Negative for chest pain, palpitations, orthopnea and claudication.  Gastrointestinal: Negative for abdominal pain, blood in stool, constipation, diarrhea, heartburn, melena, nausea and vomiting.  Genitourinary: Negative for dysuria, flank pain, frequency, hematuria and urgency.  Musculoskeletal: Negative for back pain, joint pain and myalgias.  Skin: Negative for rash.  Neurological: Negative for dizziness, tingling, focal weakness, seizures, weakness and headaches.  Endo/Heme/Allergies: Does not bruise/bleed easily.  Psychiatric/Behavioral: Negative for depression and suicidal ideas. The patient does not  have insomnia.        No Known Allergies   Past Medical History:  Diagnosis Date  . Anxiety   . Autism   . Autism      History reviewed. No pertinent surgical history.  Social History   Social History  . Marital status: Single    Spouse name: N/A  . Number of children: N/A  . Years of education: N/A   Occupational History  . Not on file.   Social History Main Topics  . Smoking status: Never Smoker  . Smokeless tobacco: Never Used  . Alcohol use No  . Drug use: No  . Sexual activity: No   Other Topics Concern  . Not on file   Social History Narrative  . No narrative on file    Family History  Problem Relation Age of Onset  . Cancer Mother      Current Outpatient Prescriptions:  .  Calcium 600-400 MG-UNIT CHEW, Chew 1 tablet by mouth 2 (two) times daily., Disp: 60 tablet, Rfl: 2 .  pantoprazole (PROTONIX) 20 MG tablet, Take 1 tablet (20 mg total) by mouth daily., Disp: 30 tablet, Rfl: 1 .  predniSONE (DELTASONE) 10 MG tablet, TAKE 5 &1/2 TABLETS ONCE DAILY WITH BREAKFAST., Disp: 164 tablet, Rfl: 0 .  sulfamethoxazole-trimethoprim (BACTRIM DS,SEPTRA DS) 800-160 MG tablet, Take 1 tablet by mouth 3 (three) times a week., Disp: 24 tablet, Rfl: 0 No current facility-administered medications for this visit.   Facility-Administered Medications Ordered in Other Visits:  .  0.9 %  sodium chloride infusion, , Intravenous, Once, Sindy Guadeloupe, MD, Stopped at 10/21/16 1441 .  riTUXimab (RITUXAN) 800 mg in sodium chloride 0.9 % 170 mL chemo infusion, 375 mg/m2, Intravenous, Once, Sindy Guadeloupe, MD, Stopped at 10/21/16 1436  Physical exam:  Vitals:   10/21/16 1127  BP: 128/83  Pulse: 66  Resp: 18  Temp: (!) 97.5 F (36.4 C)  TempSrc: Tympanic  Weight: 178 lb 4.8 oz (80.9 kg)  Height: 6' 2.8" (1.9 m)   Physical Exam  Constitutional: He is oriented to person, place, and time and well-developed, well-nourished, and in no distress.  HENT:  Head: Normocephalic  and atraumatic.  Eyes: Pupils are equal, round, and reactive to light. EOM are normal.  Neck: Normal range of motion.  Cardiovascular: Normal rate, regular rhythm and normal heart sounds.   Pulmonary/Chest: Effort normal and breath sounds normal.  Abdominal: Soft. Bowel sounds are normal.  Neurological: He is alert and oriented to person, place, and time.  Skin: Skin is warm and dry.     CMP Latest Ref Rng & Units 10/21/2016  Glucose 65 - 99 mg/dL 102(H)  BUN 6 - 20 mg/dL 10  Creatinine 0.61 - 1.24 mg/dL 0.77  Sodium 135 - 145 mmol/L 138  Potassium 3.5 - 5.1 mmol/L 3.8  Chloride 101 - 111 mmol/L 100(L)  CO2 22 - 32 mmol/L 31  Calcium 8.9 - 10.3 mg/dL 9.3  Total Protein 6.5 - 8.1 g/dL 7.0  Total Bilirubin 0.3 - 1.2 mg/dL 0.8  Alkaline Phos 38 - 126 U/L 39  AST 15 - 41 U/L 15  ALT 17 - 63 U/L 12(L)   CBC Latest Ref Rng & Units 10/21/2016  WBC 3.8 - 10.6 K/uL 5.4  Hemoglobin 13.0 - 18.0 g/dL 15.8  Hematocrit 40.0 - 52.0 % 46.0  Platelets 150 - 440 K/uL 176  Assessment and plan- Patient is a 26 y.o. male with autoimmune pancytopenia steroid responsive here for on treatment assessment prior to cycle 2 of weekly rituxan  Counts ok to proceed with cycle 2 of weekly rituxan today. He will directly proceed with cycle 3 of rituxan next week. Check cbc, cmp that day. rtc in 2 weeks for MD assessment by one of my covering partners prior to cycle 4 of weekly rituxan.   Check cbc, cmp that day. If counts are normal stable, I would recommend starting steroid taper at that time. He is currently on 55 mg prednisone which needs to be slowly decreased by 5 mg every 3 days and eventually stop in 4- 5 weeks based on counts. If counts drop after tapering steroids, that would show he has not responded to rituxan and he would need splenectomy at that point  Continue high dose steroids. Continue protnoix and pcp prophylaxis. He has received all his rpe splenectomy vaccinations  I will see him back  in 4 weeks with cbc, cmp. Will also check cbc in 3 weeks (1 week after starting steroid tapet and dose of rituxan)   Visit Diagnosis 1. Autoimmune pancytopenia ([place])   2. Encounter for monitoring rituximab therapy      [name], MD, MPH [facility] at [facility] Pager- [phone] 10/21/2016 2:14 PM

## 2016-10-21 NOTE — Progress Notes (Signed)
He is feeling good, good appetite but he is still loosing wt.

## 2016-10-28 ENCOUNTER — Inpatient Hospital Stay: Payer: Medicare Other

## 2016-10-28 ENCOUNTER — Other Ambulatory Visit: Payer: Self-pay | Admitting: Oncology

## 2016-10-28 VITALS — BP 132/88 | HR 71 | Temp 96.0°F | Resp 20

## 2016-10-28 DIAGNOSIS — D61818 Other pancytopenia: Secondary | ICD-10-CM

## 2016-10-28 DIAGNOSIS — F419 Anxiety disorder, unspecified: Secondary | ICD-10-CM | POA: Diagnosis not present

## 2016-10-28 DIAGNOSIS — Z5112 Encounter for antineoplastic immunotherapy: Secondary | ICD-10-CM | POA: Diagnosis not present

## 2016-10-28 DIAGNOSIS — F840 Autistic disorder: Secondary | ICD-10-CM | POA: Diagnosis not present

## 2016-10-28 DIAGNOSIS — Z79899 Other long term (current) drug therapy: Secondary | ICD-10-CM | POA: Diagnosis not present

## 2016-10-28 LAB — CBC WITH DIFFERENTIAL/PLATELET
BASOS ABS: 0 10*3/uL (ref 0–0.1)
Basophils Relative: 1 %
EOS ABS: 0 10*3/uL (ref 0–0.7)
EOS PCT: 1 %
HCT: 43.3 % (ref 40.0–52.0)
Hemoglobin: 15.2 g/dL (ref 13.0–18.0)
Lymphocytes Relative: 32 %
Lymphs Abs: 1.5 10*3/uL (ref 1.0–3.6)
MCH: 29.6 pg (ref 26.0–34.0)
MCHC: 35.1 g/dL (ref 32.0–36.0)
MCV: 84.5 fL (ref 80.0–100.0)
MONO ABS: 0.6 10*3/uL (ref 0.2–1.0)
Monocytes Relative: 12 %
Neutro Abs: 2.7 10*3/uL (ref 1.4–6.5)
Neutrophils Relative %: 54 %
PLATELETS: 132 10*3/uL — AB (ref 150–440)
RBC: 5.13 MIL/uL (ref 4.40–5.90)
RDW: 14 % (ref 11.5–14.5)
WBC: 4.9 10*3/uL (ref 3.8–10.6)

## 2016-10-28 LAB — COMPREHENSIVE METABOLIC PANEL
ALT: 12 U/L — AB (ref 17–63)
AST: 14 U/L — AB (ref 15–41)
Albumin: 4.1 g/dL (ref 3.5–5.0)
Alkaline Phosphatase: 37 U/L — ABNORMAL LOW (ref 38–126)
Anion gap: 4 — ABNORMAL LOW (ref 5–15)
BUN: 13 mg/dL (ref 6–20)
CHLORIDE: 99 mmol/L — AB (ref 101–111)
CO2: 33 mmol/L — AB (ref 22–32)
Calcium: 9.2 mg/dL (ref 8.9–10.3)
Creatinine, Ser: 0.71 mg/dL (ref 0.61–1.24)
Glucose, Bld: 81 mg/dL (ref 65–99)
POTASSIUM: 3.7 mmol/L (ref 3.5–5.1)
SODIUM: 136 mmol/L (ref 135–145)
Total Bilirubin: 0.8 mg/dL (ref 0.3–1.2)
Total Protein: 6.4 g/dL — ABNORMAL LOW (ref 6.5–8.1)

## 2016-10-28 MED ORDER — SODIUM CHLORIDE 0.9 % IV SOLN
375.0000 mg/m2 | Freq: Once | INTRAVENOUS | Status: AC
  Administered 2016-10-28: 800 mg via INTRAVENOUS
  Filled 2016-10-28: qty 30

## 2016-10-28 MED ORDER — DIPHENHYDRAMINE HCL 25 MG PO CAPS
50.0000 mg | ORAL_CAPSULE | Freq: Once | ORAL | Status: AC
  Administered 2016-10-28: 50 mg via ORAL

## 2016-10-28 MED ORDER — SODIUM CHLORIDE 0.9 % IV SOLN: 375.0000 mg/m2 | Freq: Once | INTRAVENOUS | Status: DC

## 2016-10-28 MED ORDER — ACETAMINOPHEN 325 MG PO TABS
650.0000 mg | ORAL_TABLET | Freq: Once | ORAL | Status: AC
  Administered 2016-10-28: 650 mg via ORAL

## 2016-10-28 MED ORDER — SODIUM CHLORIDE 0.9 % IV SOLN
Freq: Once | INTRAVENOUS | Status: AC
  Administered 2016-10-28: 11:00:00 via INTRAVENOUS
  Filled 2016-10-28: qty 1000

## 2016-11-02 ENCOUNTER — Telehealth: Payer: Self-pay | Admitting: *Deleted

## 2016-11-02 ENCOUNTER — Telehealth: Payer: Self-pay | Admitting: Oncology

## 2016-11-02 NOTE — Telephone Encounter (Signed)
Patient was originally scheduled by myself for Lab/MD/Rituxan on 11/04/16. Patient was rescheduled by Dianna Scruggs from 11/04/16 with Victorino Dike that's covering for Smith Robert, to 11/11/16. Per Cordelia Pen, Dr Assunta Gambles Nurse, Lab/MD/Rituxan was reschedule by [facility] for the wrong date and needed to be rescheduled back to 11/04/16. All appointments was rescheduled back to 11/04/16. Per Cordelia Pen, she will notify the patient.

## 2016-11-02 NOTE — Telephone Encounter (Signed)
Toniann Fail called about the pt having flu shot tom. When he goes to see PCP tom.  I spoke to Houstonia and she states no because he is on steroids and his counts are accurate because of him being on steroids. Maybe after he is coming off steroids and completes the rituxan. Toniann Fail understands.  I then told her that the appt for Josh was cancelled and moved to a different day and when she called I saw that the appt was wrong, it has been corrected and now the appt starts at 9:15 as lab appt then md and then treatment.  Toniann Fail did not even know that the original appt was cancelled. I told her I was so happy that she called. She will make sure Sharia Reeve is there.

## 2016-11-03 NOTE — Progress Notes (Signed)
Hematology/Oncology Consult note [facility]  Telephone:([phone] [phone]  Patient Care Team: [name], MD as PCP - General (Internal Medicine)   Name of the patient: Bobby Dean  [id number]  [birth year]   Date of visit: 11/04/2016  Diagnosis- autoimmune neutropenia and thrombocytopenia  Chief complaint/ Reason for visit- on treatment assessment prior to week 4 Rituxan.  Heme/Onc history:Patient is a 26 year old male was then referred to [place] for evaluation and management of thrombocytopenia. He has a history of autism and intellectual visibility and currently lives in a group home for the last 7 months. His medical decision maker is [name] from [place] office.   Blood work from 04/28/2016 showed white count of 2.1 with an H&H of 15.5/45.1 and a platelet count of 51. CMP was within normal limits. TSH was normal at 1.09. At this time I do not have any prior CBCs for comparison.  Patient is here with his group home Asst. [name] today who reports that patient has been doing well over the last 7 months and has no known medical problems. He does not take any medications or over-the-counter medications. He has been feeling well and has had no sick days and denies any complaints.Patient is comfortable and is able to give Limited history and denies any complaints at this time his mother died of cancer (unknown which one). Other family history is not known.  Bone marrow biopsy on 05/14/2016 showed normal trilineage hematopoiesis with no evidence of lymphoma or leukemia. Peripheral flow cytometry showed reversal of CD4 to CD8 ratio. HIV hepatitis B and hepatitis C testing was negative. B12 and folate were within normal limits. PT/PTT INR was within normal limits.  Review of peripheral smear showed leukopenia and moderate neutropenia. Thrombocytopenia. RBC morphology is normal. Scattered reactive appearing lymphocytes are  seen. No blasts or early forms noted.  Monospot testing was positive. ANA was negative. Quantitative immunoglobulins were within normal limits. Serum copper was within normal limits. CMV DNA PCR negative  Patient was started on empiric high dose steroids for possible autoimmune etiology on 05/28/16. His counts normalized. Then his steroid taper was started. His WBC remained normal but his platelets began to decline 60's.   Plan was to restart full dose steroids and switch to rituxan second line after receiving pre splenectomy vaccines in anticipation of splenectomy should he not respond to rituxan  Patient has received all his pre splenectomy vaccines. He starts rituxan on 10/14/16. Seen at [facility] for second opinion by [name] who agrees with the plan. Plan is to taper steroids after week 3 of rituxan if counts stabilize  Interval history-  Patient presents to the clinic today with caregiver, [name]. Patient is doing well overall. Caregiver reports that patient has been "tired and cold".   He denies any fever.  Weight down by 4 pounds since last month. Hemoglobin 15.6, hematocrit 44.0, platelets 146,000. WBC 5.7.  ECOG PS- 0 Pain scale- 0   Review of systems- Review of Systems  Constitutional: Negative for chills, fever, malaise/fatigue and weight loss.       Little tired.  Feels cold.  No chills or fever.  HENT: Negative.  Negative for congestion, ear discharge and nosebleeds.   Eyes: Negative.  Negative for blurred vision.  Respiratory: Negative.  Negative for cough, hemoptysis, sputum production, shortness of breath and wheezing.   Cardiovascular: Negative.  Negative for chest pain, palpitations, orthopnea and claudication.  Gastrointestinal: Negative.  Negative for abdominal pain,  blood in stool, constipation, diarrhea, heartburn, melena, nausea and vomiting.  Genitourinary: Negative.  Negative for dysuria, flank pain, frequency, hematuria and urgency.  Musculoskeletal: Negative.   Negative for back pain, joint pain and myalgias.  Skin: Negative.  Negative for rash.  Neurological: Negative.  Negative for dizziness, tingling, focal weakness, seizures, weakness and headaches.  Endo/Heme/Allergies: Negative.  Does not bruise/bleed easily.  Psychiatric/Behavioral: Negative for depression and suicidal ideas. The patient does not have insomnia.        No Known Allergies   Past Medical History:  Diagnosis Date  . Anxiety   . Autism   . Autism      History reviewed. No pertinent surgical history.  Social History   Social History  . Marital status: Single    Spouse name: N/A  . Number of children: N/A  . Years of education: N/A   Occupational History  . Not on file.   Social History Main Topics  . Smoking status: Never Smoker  . Smokeless tobacco: Never Used  . Alcohol use No  . Drug use: No  . Sexual activity: No   Other Topics Concern  . Not on file   Social History Narrative  . No narrative on file    Family History  Problem Relation Age of Onset  . Cancer Mother      Current Outpatient Prescriptions:  .  Calcium 600-400 MG-UNIT CHEW, Chew 1 tablet by mouth 2 (two) times daily., Disp: 60 tablet, Rfl: 2 .  pantoprazole (PROTONIX) 20 MG tablet, TAKE 1 TABLET BY MOUTH ONCE DAILY., Disp: 28 tablet, Rfl: 0 .  predniSONE (DELTASONE) 10 MG tablet, TAKE 5 &1/2 TABLETS ONCE DAILY WITH BREAKFAST., Disp: 164 tablet, Rfl: 0 .  sulfamethoxazole-trimethoprim (BACTRIM DS,SEPTRA DS) 800-160 MG tablet, Take 1 tablet by mouth 3 (three) times a week., Disp: 24 tablet, Rfl: 0  Physical exam:  Vitals:   11/04/16 0929  BP: 117/83  Pulse: 61  Resp: 18  Temp: (!) 96.9 F (36.1 C)  TempSrc: Tympanic  Weight: 182 lb 7 oz (82.8 kg)   Physical Exam  Constitutional: He is oriented to person, place, and time and well-developed, well-nourished, and in no distress.  HENT:  Head: Normocephalic and atraumatic.  Eyes: Pupils are equal, round, and reactive to  light. EOM are normal.  Neck: Normal range of motion.  Cardiovascular: Normal rate, regular rhythm and normal heart sounds.   Pulmonary/Chest: Effort normal and breath sounds normal.  Abdominal: Soft. Bowel sounds are normal.  Lymphadenopathy:  No palpable cervical, supraclavicular, axillary or inguinal adenopathy.  Neurological: He is alert and oriented to person, place, and time.  Skin: Skin is warm and dry.  Psychiatric: Mood normal.     CMP Latest Ref Rng & Units 11/04/2016  Glucose 65 - 99 mg/dL 95  BUN 6 - 20 mg/dL 11  Creatinine 0.61 - 1.24 mg/dL 0.75  Sodium 135 - 145 mmol/L 140  Potassium 3.5 - 5.1 mmol/L 3.9  Chloride 101 - 111 mmol/L 102  CO2 22 - 32 mmol/L 32  Calcium 8.9 - 10.3 mg/dL 9.6  Total Protein 6.5 - 8.1 g/dL 7.2  Total Bilirubin 0.3 - 1.2 mg/dL 0.7  Alkaline Phos 38 - 126 U/L 39  AST 15 - 41 U/L 14(L)  ALT 17 - 63 U/L 14(L)   CBC Latest Ref Rng & Units 11/04/2016  WBC 3.8 - 10.6 K/uL 5.7  Hemoglobin 13.0 - 18.0 g/dL 15.6  Hematocrit 40.0 - 52.0 % 44.0  Platelets  150 - 440 K/uL 146(L)      Assessment and plan- Patient is a 26 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  1.  Labs today: CBC with diff, CMP. 2.  Week #4 Rituxan today. 3.  Continue steroids. Discussed tapering dose as planned by Dr. Janese Banks. We will decrease by 5 mg every 3 days, with the goal of discontinuation in 4-5 weeks based on adequate blood counts. Will start 50 mg dose on 11/05/2016, and decrease to 45 mg on 11/08/2016. Patient to remain at 66m dose until seen by Dr. RJanese Banks  Additional taper submitted by SMoishe Spice RN. 4.  Continue PPI therapy (Protonix) and PCP prophylaxis (Bactrim). 5.  RTC in 1 week for labs (CBC with diff). 6.  RTC in 2 weeks for MD (Dr RJanese Banks assessment and labs (CBC with diff).     Visit Diagnosis: 1. Autoimmune pancytopenia 2. Encounter for monitoring of Rituximab therapy   Dr.  MNolon Stalls MD [facility]8/23/2018 4:24 PM

## 2016-11-04 ENCOUNTER — Inpatient Hospital Stay: Payer: Medicare Other

## 2016-11-04 ENCOUNTER — Inpatient Hospital Stay: Payer: Medicare Other | Admitting: Oncology

## 2016-11-04 ENCOUNTER — Encounter: Payer: Self-pay | Admitting: Hematology and Oncology

## 2016-11-04 ENCOUNTER — Inpatient Hospital Stay (HOSPITAL_BASED_OUTPATIENT_CLINIC_OR_DEPARTMENT_OTHER): Payer: Medicare Other | Admitting: Hematology and Oncology

## 2016-11-04 ENCOUNTER — Other Ambulatory Visit: Payer: Self-pay | Admitting: *Deleted

## 2016-11-04 VITALS — BP 117/83 | HR 61 | Temp 96.9°F | Resp 18 | Wt 182.4 lb

## 2016-11-04 DIAGNOSIS — D61818 Other pancytopenia: Secondary | ICD-10-CM | POA: Diagnosis not present

## 2016-11-04 DIAGNOSIS — F840 Autistic disorder: Secondary | ICD-10-CM | POA: Diagnosis not present

## 2016-11-04 DIAGNOSIS — Z79899 Other long term (current) drug therapy: Secondary | ICD-10-CM | POA: Diagnosis not present

## 2016-11-04 DIAGNOSIS — Z5112 Encounter for antineoplastic immunotherapy: Secondary | ICD-10-CM | POA: Diagnosis not present

## 2016-11-04 DIAGNOSIS — Z0000 Encounter for general adult medical examination without abnormal findings: Secondary | ICD-10-CM | POA: Insufficient documentation

## 2016-11-04 DIAGNOSIS — F419 Anxiety disorder, unspecified: Secondary | ICD-10-CM | POA: Diagnosis not present

## 2016-11-04 LAB — CBC WITH DIFFERENTIAL/PLATELET
BASOS ABS: 0 10*3/uL (ref 0–0.1)
BASOS PCT: 0 %
EOS ABS: 0 10*3/uL (ref 0–0.7)
EOS PCT: 1 %
HCT: 44 % (ref 40.0–52.0)
Hemoglobin: 15.6 g/dL (ref 13.0–18.0)
LYMPHS PCT: 19 %
Lymphs Abs: 1.1 10*3/uL (ref 1.0–3.6)
MCH: 29.7 pg (ref 26.0–34.0)
MCHC: 35.3 g/dL (ref 32.0–36.0)
MCV: 84 fL (ref 80.0–100.0)
Monocytes Absolute: 0.3 10*3/uL (ref 0.2–1.0)
Monocytes Relative: 6 %
Neutro Abs: 4.2 10*3/uL (ref 1.4–6.5)
Neutrophils Relative %: 74 %
PLATELETS: 146 10*3/uL — AB (ref 150–440)
RBC: 5.24 MIL/uL (ref 4.40–5.90)
RDW: 14 % (ref 11.5–14.5)
WBC: 5.7 10*3/uL (ref 3.8–10.6)

## 2016-11-04 LAB — COMPREHENSIVE METABOLIC PANEL
ALT: 14 U/L — AB (ref 17–63)
ANION GAP: 6 (ref 5–15)
AST: 14 U/L — AB (ref 15–41)
Albumin: 4.5 g/dL (ref 3.5–5.0)
Alkaline Phosphatase: 39 U/L (ref 38–126)
BUN: 11 mg/dL (ref 6–20)
CALCIUM: 9.6 mg/dL (ref 8.9–10.3)
CO2: 32 mmol/L (ref 22–32)
CREATININE: 0.75 mg/dL (ref 0.61–1.24)
Chloride: 102 mmol/L (ref 101–111)
GFR calc non Af Amer: >60 mL/min (ref >60)
GLUCOSE: 95 mg/dL (ref 65–99)
Potassium: 3.9 mmol/L (ref 3.5–5.1)
SODIUM: 140 mmol/L (ref 135–145)
TOTAL PROTEIN: 7.2 g/dL (ref 6.5–8.1)
Total Bilirubin: 0.7 mg/dL (ref 0.3–1.2)

## 2016-11-04 MED ORDER — ACETAMINOPHEN 325 MG PO TABS
650.0000 mg | ORAL_TABLET | Freq: Once | ORAL | Status: AC
  Administered 2016-11-04: 650 mg via ORAL
  Filled 2016-11-04: qty 2

## 2016-11-04 MED ORDER — SODIUM CHLORIDE 0.9 % IV SOLN
375.0000 mg/m2 | Freq: Once | INTRAVENOUS | Status: AC
  Administered 2016-11-04: 800 mg via INTRAVENOUS
  Filled 2016-11-04: qty 50

## 2016-11-04 MED ORDER — SODIUM CHLORIDE 0.9 % IV SOLN
Freq: Once | INTRAVENOUS | Status: AC
Start: 2016-11-04 — End: 2016-11-04
  Administered 2016-11-04: 11:00:00 via INTRAVENOUS
  Filled 2016-11-04: qty 1000

## 2016-11-04 MED ORDER — DIPHENHYDRAMINE HCL 25 MG PO CAPS
50.0000 mg | ORAL_CAPSULE | Freq: Once | ORAL | Status: AC
  Administered 2016-11-04: 50 mg via ORAL
  Filled 2016-11-04: qty 2

## 2016-11-04 MED ORDER — SODIUM CHLORIDE 0.9 % IV SOLN: 375.0000 mg/m2 | Freq: Once | INTRAVENOUS | Status: DC

## 2016-11-04 NOTE — Progress Notes (Signed)
Patient accompanied by caretaker who states patient complained Sunday and today about feeling cool.  No chills.  Also states over the weekend that he felt tired, which is unusual for him.  Otherwise, no complaints.

## 2016-11-11 ENCOUNTER — Inpatient Hospital Stay: Payer: Medicare Other

## 2016-11-11 ENCOUNTER — Inpatient Hospital Stay: Payer: Medicare Other | Admitting: Oncology

## 2016-11-11 ENCOUNTER — Ambulatory Visit: Payer: Medicare Other | Admitting: Oncology

## 2016-11-11 ENCOUNTER — Telehealth: Payer: Self-pay | Admitting: *Deleted

## 2016-11-11 DIAGNOSIS — Z5112 Encounter for antineoplastic immunotherapy: Secondary | ICD-10-CM | POA: Diagnosis not present

## 2016-11-11 DIAGNOSIS — Z79899 Other long term (current) drug therapy: Secondary | ICD-10-CM | POA: Diagnosis not present

## 2016-11-11 DIAGNOSIS — D61818 Other pancytopenia: Secondary | ICD-10-CM

## 2016-11-11 DIAGNOSIS — F419 Anxiety disorder, unspecified: Secondary | ICD-10-CM | POA: Diagnosis not present

## 2016-11-11 DIAGNOSIS — F840 Autistic disorder: Secondary | ICD-10-CM | POA: Diagnosis not present

## 2016-11-11 LAB — CBC WITH DIFFERENTIAL/PLATELET
Basophils Absolute: 0 10*3/uL (ref 0–0.1)
Basophils Relative: 0 %
Eosinophils Absolute: 0 10*3/uL (ref 0–0.7)
Eosinophils Relative: 0 %
HCT: 44.5 % (ref 40.0–52.0)
Hemoglobin: 15.6 g/dL (ref 13.0–18.0)
Lymphocytes Relative: 12 %
Lymphs Abs: 0.5 10*3/uL — ABNORMAL LOW (ref 1.0–3.6)
MCH: 29.8 pg (ref 26.0–34.0)
MCHC: 35.1 g/dL (ref 32.0–36.0)
MCV: 84.7 fL (ref 80.0–100.0)
Monocytes Absolute: 0.1 10*3/uL — ABNORMAL LOW (ref 0.2–1.0)
Monocytes Relative: 2 %
Neutro Abs: 3.9 10*3/uL (ref 1.4–6.5)
Neutrophils Relative %: 86 %
Platelets: 153 10*3/uL (ref 150–440)
RBC: 5.26 MIL/uL (ref 4.40–5.90)
RDW: 13.9 % (ref 11.5–14.5)
WBC: 4.6 10*3/uL (ref 3.8–10.6)

## 2016-11-11 LAB — COMPREHENSIVE METABOLIC PANEL
ALT: 12 U/L — ABNORMAL LOW (ref 17–63)
AST: 14 U/L — ABNORMAL LOW (ref 15–41)
Albumin: 4.3 g/dL (ref 3.5–5.0)
Alkaline Phosphatase: 37 U/L — ABNORMAL LOW (ref 38–126)
Anion gap: 3 — ABNORMAL LOW (ref 5–15)
BUN: 13 mg/dL (ref 6–20)
CO2: 34 mmol/L — ABNORMAL HIGH (ref 22–32)
Calcium: 9.3 mg/dL (ref 8.9–10.3)
Chloride: 101 mmol/L (ref 101–111)
Creatinine, Ser: 0.72 mg/dL (ref 0.61–1.24)
GFR calc Af Amer: >60 mL/min (ref >60)
GFR calc non Af Amer: >60 mL/min (ref >60)
Glucose, Bld: 97 mg/dL (ref 65–99)
Potassium: 4.2 mmol/L (ref 3.5–5.1)
Sodium: 138 mmol/L (ref 135–145)
Total Bilirubin: 0.7 mg/dL (ref 0.3–1.2)
Total Protein: 6.9 g/dL (ref 6.5–8.1)

## 2016-11-11 NOTE — Telephone Encounter (Signed)
-----   Message from Creig HinesArchana C Rao, MD sent at 11/11/2016 10:44 AM EDT ----- Taper prednisone dose to 40 mg and he will stay on that dose until he sees me next week with cbc. Please inform him. thanks

## 2016-11-11 NOTE — Telephone Encounter (Signed)
Called Toniann FailWendy and told her that Dr. Smith Robertao reviewd the cbc and would like Bobby Dean to stay on 40 mg daily. She will take the pill packs back to pharmacy and I called the pharmacy and changed to 40 mg thru 9/6 and then we may change it again then.

## 2016-11-11 NOTE — Telephone Encounter (Signed)
-----   Message from Archana C Rao, MD sent at 11/11/2016 10:44 AM EDT ----- Taper prednisone dose to 40 mg and he will stay on that dose until he sees me next week with cbc. Please inform him. thanks 

## 2016-11-16 ENCOUNTER — Other Ambulatory Visit: Payer: Self-pay | Admitting: Oncology

## 2016-11-16 DIAGNOSIS — D61818 Other pancytopenia: Secondary | ICD-10-CM

## 2016-11-18 ENCOUNTER — Telehealth: Payer: Self-pay | Admitting: *Deleted

## 2016-11-18 ENCOUNTER — Inpatient Hospital Stay: Payer: Medicare Other | Attending: Oncology | Admitting: Oncology

## 2016-11-18 ENCOUNTER — Inpatient Hospital Stay: Payer: Medicare Other

## 2016-11-18 ENCOUNTER — Telehealth: Payer: Self-pay | Admitting: Oncology

## 2016-11-18 ENCOUNTER — Encounter: Payer: Self-pay | Admitting: Oncology

## 2016-11-18 VITALS — BP 119/81 | HR 65 | Temp 97.5°F | Resp 18 | Wt 180.0 lb

## 2016-11-18 DIAGNOSIS — Z9081 Acquired absence of spleen: Secondary | ICD-10-CM | POA: Insufficient documentation

## 2016-11-18 DIAGNOSIS — D61818 Other pancytopenia: Secondary | ICD-10-CM | POA: Insufficient documentation

## 2016-11-18 DIAGNOSIS — F419 Anxiety disorder, unspecified: Secondary | ICD-10-CM | POA: Diagnosis not present

## 2016-11-18 DIAGNOSIS — F840 Autistic disorder: Secondary | ICD-10-CM | POA: Diagnosis not present

## 2016-11-18 LAB — COMPREHENSIVE METABOLIC PANEL
ALBUMIN: 4.3 g/dL (ref 3.5–5.0)
ALK PHOS: 39 U/L (ref 38–126)
ALT: 13 U/L — AB (ref 17–63)
AST: 15 U/L (ref 15–41)
Anion gap: 7 (ref 5–15)
BILIRUBIN TOTAL: 0.6 mg/dL (ref 0.3–1.2)
BUN: 11 mg/dL (ref 6–20)
CALCIUM: 9.3 mg/dL (ref 8.9–10.3)
CO2: 31 mmol/L (ref 22–32)
CREATININE: 0.69 mg/dL (ref 0.61–1.24)
Chloride: 99 mmol/L — ABNORMAL LOW (ref 101–111)
GFR calc Af Amer: >60 mL/min (ref >60)
GLUCOSE: 95 mg/dL (ref 65–99)
POTASSIUM: 3.7 mmol/L (ref 3.5–5.1)
Sodium: 137 mmol/L (ref 135–145)
TOTAL PROTEIN: 7 g/dL (ref 6.5–8.1)

## 2016-11-18 LAB — CBC WITH DIFFERENTIAL/PLATELET
BASOS ABS: 0 10*3/uL (ref 0–0.1)
BASOS PCT: 0 %
Eosinophils Absolute: 0 10*3/uL (ref 0–0.7)
Eosinophils Relative: 0 %
HEMATOCRIT: 43.3 % (ref 40.0–52.0)
HEMOGLOBIN: 15.4 g/dL (ref 13.0–18.0)
LYMPHS PCT: 11 %
Lymphs Abs: 0.8 10*3/uL — ABNORMAL LOW (ref 1.0–3.6)
MCH: 30 pg (ref 26.0–34.0)
MCHC: 35.5 g/dL (ref 32.0–36.0)
MCV: 84.6 fL (ref 80.0–100.0)
MONO ABS: 0.2 10*3/uL (ref 0.2–1.0)
Monocytes Relative: 3 %
NEUTROS ABS: 6.5 10*3/uL (ref 1.4–6.5)
NEUTROS PCT: 86 %
Platelets: 149 10*3/uL — ABNORMAL LOW (ref 150–440)
RBC: 5.12 MIL/uL (ref 4.40–5.90)
RDW: 14 % (ref 11.5–14.5)
WBC: 7.6 10*3/uL (ref 3.8–10.6)

## 2016-11-18 NOTE — Telephone Encounter (Signed)
Patient left without checking out/scheduling next appointments. Called caretaker's number multiple times and calls was dropped due to her phone's signal. Appt schedule mailed.

## 2016-11-18 NOTE — Progress Notes (Signed)
Pt here w Toniann FailWendy from [facility]. No voiced c/o

## 2016-11-18 NOTE — Telephone Encounter (Signed)
Called pharmacy

## 2016-11-18 NOTE — Progress Notes (Signed)
Hematology/Oncology Consult note [facility]  Telephone:([phone] Fax:([phone]) [phone]  Patient Care Team: [name], MD as PCP - General (Internal Medicine)   Name of the patient: Bobby Dean  [id number]  Apr 25, 1990   Date of visit: 11/18/16  Diagnosis- autoimmune neutropenia and thrombocytopenia  Chief complaint/ Reason for visit- on treatment assessment prior to cycle 2 of weekly rituxan  Heme/Onc history:Patient is a 26 year old male was then referred to [place] for evaluation and management of thrombocytopenia. He has a history of autism and intellectual visibility and currently lives in a group home for the last 7 months. His medical decision maker is [name] from [place] office.   Blood work from 04/28/2016 showed white count of 2.1 with an H&H of 15.5/45.1 and a platelet count of 51. CMP was within normal limits. TSH was normal at 1.09. At this time I do not have any prior CBCs for comparison.  Patient is here with his group home Asst. [name] today who reports that patient has been doing well over the last 7 months and has no known medical problems. He does not take any medications or over-the-counter medications. He has been feeling well and has had no sick days and denies any complaints.Patient is comfortable and is able to give Limited history and denies any complaints at this time his mother died of cancer (unknown which one). Other family history is not known.  Bone marrow biopsy on 05/14/2016 showed normal trilineage hematopoiesis with no evidence of lymphoma or leukemia. Peripheral flow cytometry showed reversal of CD4 to CD8 ratio. HIV hepatitis B and hepatitis C testing was negative. B12 and folate were within normal limits. PT/PTT INR was within normal limits.  Review of peripheral smear showed leukopenia and moderate neutropenia. Thrombocytopenia. RBC morphology is normal. Scattered reactive appearing lymphocytes  are seen. No blasts or early forms noted.  Monospot testing was positive. ANA was negative. Quantitative immunoglobulins were within normal limits. Serum copper was within normal limits. CMV DNA PCR negative  Patient was started on empiric high dose steroids for possible autoimmune etiology on 05/28/16. His counts normalized. Then his steroid taper was started. His WBC remained normal but his platelets began to decline 60's.   Plan was to restart full dose steroids and switch to rituxan second line after receiving pre splenectomy vaccines in anticipation of splenectomy should he not respond to rituxan  Patient has received all his pre splenectomy vaccines. He starts rituxan on 10/14/16. Seen at [facility] for second opinion by [name] who agrees with the plan.  [name] and is on slow steroid taper  Interval history- doing well. Denies any complaints  ECOG PS- 0 Pain scale- 0   Review of systems- Review of Systems  Constitutional: Negative for chills, fever, malaise/fatigue and weight loss.  HENT: Negative for congestion, ear discharge and nosebleeds.   Eyes: Negative for blurred vision.  Respiratory: Negative for cough, hemoptysis, sputum production, shortness of breath and wheezing.   Cardiovascular: Negative for chest pain, palpitations, orthopnea and claudication.  Gastrointestinal: Negative for abdominal pain, blood in stool, constipation, diarrhea, heartburn, melena, nausea and vomiting.  Genitourinary: Negative for dysuria, flank pain, frequency, hematuria and urgency.  Musculoskeletal: Negative for back pain, joint pain and myalgias.  Skin: Negative for rash.  Neurological: Negative for dizziness, tingling, focal weakness, seizures, weakness and headaches.  Endo/Heme/Allergies: Does not bruise/bleed easily.  Psychiatric/Behavioral: Negative for depression and suicidal ideas. The patient  does not have insomnia.       No Known  Allergies   Past Medical History:  Diagnosis Date  . Anxiety   . Autism   . Autism      No past surgical history on file.  Social History   Social History  . Marital status: Single    Spouse name: N/A  . Number of children: N/A  . Years of education: N/A   Occupational History  . Not on file.   Social History Main Topics  . Smoking status: Never Smoker  . Smokeless tobacco: Never Used  . Alcohol use No  . Drug use: No  . Sexual activity: No   Other Topics Concern  . Not on file   Social History Narrative  . No narrative on file    Family History  Problem Relation Age of Onset  . Cancer Mother      Current Outpatient Prescriptions:  .  Calcium 600-400 MG-UNIT CHEW, Chew 1 tablet by mouth 2 (two) times daily., Disp: 60 tablet, Rfl: 2 .  pantoprazole (PROTONIX) 20 MG tablet, TAKE 1 TABLET BY MOUTH ONCE DAILY., Disp: 28 tablet, Rfl: 0 .  predniSONE (DELTASONE) 10 MG tablet, TAKE 5 &1/2 TABLETS ONCE DAILY WITH BREAKFAST. (Patient taking differently: TAKE 4 TABLETS ONCE DAILY WITH BREAKFAST.), Disp: 164 tablet, Rfl: 0 .  sulfamethoxazole-trimethoprim (BACTRIM DS,SEPTRA DS) 800-160 MG tablet, TAKE 1 TABLET THREE TIMES A WEEK, Disp: 12 tablet, Rfl: 0  Physical exam:  Vitals:   11/18/16 1036  BP: 119/81  Pulse: 65  Resp: 18  Temp: (!) 97.5 F (36.4 C)  TempSrc: Tympanic  Weight: 180 lb (81.6 kg)   Physical Exam  Constitutional: He is oriented to person, place, and time and well-developed, well-nourished, and in no distress.  HENT:  Head: Normocephalic and atraumatic.  Mouth/Throat: Oropharynx is clear and moist.  Eyes: Pupils are equal, round, and reactive to light. EOM are normal.  Neck: Normal range of motion.  Cardiovascular: Normal rate, regular rhythm and normal heart sounds.   Pulmonary/Chest: Effort normal and breath sounds normal.  Abdominal: Soft. Bowel sounds are normal.  Neurological: He is alert and oriented to person, place, and time.  Skin:  Skin is warm and dry.     CMP Latest Ref Rng & Units 11/18/2016  Glucose 65 - 99 mg/dL 95  BUN 6 - 20 mg/dL 11  Creatinine 0.61 - 1.24 mg/dL 0.69  Sodium 135 - 145 mmol/L 137  Potassium 3.5 - 5.1 mmol/L 3.7  Chloride 101 - 111 mmol/L 99(L)  CO2 22 - 32 mmol/L 31  Calcium 8.9 - 10.3 mg/dL 9.3  Total Protein 6.5 - 8.1 g/dL 7.0  Total Bilirubin 0.3 - 1.2 mg/dL 0.6  Alkaline Phos 38 - 126 U/L 39  AST 15 - 41 U/L 15  ALT 17 - 63 U/L 13(L)   CBC Latest Ref Rng & Units 11/18/2016  WBC 3.8 - 10.6 K/uL 7.6  Hemoglobin 13.0 - 18.0 g/dL 15.4  Hematocrit 40.0 - 52.0 % 43.3  Platelets 150 - 440 K/uL 149(L)     Assessment and plan- Patient is a 26 y.o. male with autoimmune pancytopenia  Patient now on 40 mg prednisone. Counts have normalized and remained stable. He will taper prednisone to 35 mg starting tomorrow and decrease to 30 mg after 3 days. Repeat cbc with diff in 1 week and further taper to be based on that. I wills ee him back in 2 weeks with cbc/diff  Continue  bactrim prophylaxis and protnix   Visit Diagnosis 1. Autoimmune pancytopenia ([place])      [name], MD, MPH [facility] at [facility] Pager- [phone] 11/18/2016 12:14 PM

## 2016-11-19 ENCOUNTER — Other Ambulatory Visit: Payer: Self-pay | Admitting: *Deleted

## 2016-11-19 MED ORDER — CALCIUM 600-400 MG-UNIT PO CHEW: 1.0000 | CHEWABLE_TABLET | Freq: Two times a day (BID) | ORAL | 2 refills | Status: DC

## 2016-11-19 NOTE — Telephone Encounter (Signed)
I spoke to Dr. Smith Robertao and she says it is ok to renew [facility] and then called Toniann FailWendy to let her know that I sent it in to pharmacy

## 2016-11-23 ENCOUNTER — Telehealth: Payer: Self-pay | Admitting: *Deleted

## 2016-11-23 ENCOUNTER — Other Ambulatory Visit: Payer: Self-pay | Admitting: *Deleted

## 2016-11-23 DIAGNOSIS — D61818 Other pancytopenia: Secondary | ICD-10-CM

## 2016-11-23 MED ORDER — PREDNISONE 10 MG PO TABS: ORAL_TABLET | ORAL | 0 refills | Status: DC

## 2016-11-23 NOTE — Telephone Encounter (Signed)
Care giver called to inform Dr.Rao that patient had taken his last prednisone. Wanted to know if the script should be refilled.

## 2016-11-23 NOTE — Telephone Encounter (Signed)
Attempted to call caregiver back at the listed number to advise her that prednisone would be assessed after his next lab appointment per Dr. Assunta Gamblesao's office note. Number listed is incorrect.

## 2016-11-23 NOTE — Telephone Encounter (Signed)
Contacted care giver Rema FendtWendy Paul and advised her that a perscritpion for 2 days worth of prednisone was sent to the pharmacy and reminded her of his appointment tomorrow.

## 2016-11-23 NOTE — Telephone Encounter (Signed)
Please refill his prednisone of 30 mg for 2 more days. He will be coming for repeat cbc tomorrow. He does have lab appointment for the same. Based on his cbc I will decide about further taper of his prednisone. He sees me next week as scheduled

## 2016-11-24 ENCOUNTER — Inpatient Hospital Stay: Payer: Medicare Other

## 2016-11-24 ENCOUNTER — Telehealth: Payer: Self-pay | Admitting: *Deleted

## 2016-11-24 ENCOUNTER — Other Ambulatory Visit: Payer: Self-pay | Admitting: *Deleted

## 2016-11-24 ENCOUNTER — Telehealth: Payer: Self-pay | Admitting: Oncology

## 2016-11-24 DIAGNOSIS — D61818 Other pancytopenia: Secondary | ICD-10-CM

## 2016-11-24 DIAGNOSIS — Z9081 Acquired absence of spleen: Secondary | ICD-10-CM | POA: Diagnosis not present

## 2016-11-24 DIAGNOSIS — F840 Autistic disorder: Secondary | ICD-10-CM | POA: Diagnosis not present

## 2016-11-24 DIAGNOSIS — F419 Anxiety disorder, unspecified: Secondary | ICD-10-CM | POA: Diagnosis not present

## 2016-11-24 LAB — COMPREHENSIVE METABOLIC PANEL
ALK PHOS: 36 U/L — AB (ref 38–126)
ALT: 13 U/L — AB (ref 17–63)
AST: 16 U/L (ref 15–41)
Albumin: 4.2 g/dL (ref 3.5–5.0)
Anion gap: 7 (ref 5–15)
BILIRUBIN TOTAL: 0.9 mg/dL (ref 0.3–1.2)
BUN: 13 mg/dL (ref 6–20)
CALCIUM: 9.5 mg/dL (ref 8.9–10.3)
CHLORIDE: 101 mmol/L (ref 101–111)
CO2: 31 mmol/L (ref 22–32)
CREATININE: 0.7 mg/dL (ref 0.61–1.24)
GFR calc Af Amer: >60 mL/min (ref >60)
Glucose, Bld: 97 mg/dL (ref 65–99)
Potassium: 3.4 mmol/L — ABNORMAL LOW (ref 3.5–5.1)
Sodium: 139 mmol/L (ref 135–145)
Total Protein: 6.8 g/dL (ref 6.5–8.1)

## 2016-11-24 LAB — CBC WITH DIFFERENTIAL/PLATELET
BASOS ABS: 0 10*3/uL (ref 0–0.1)
Basophils Relative: 0 %
Eosinophils Absolute: 0 10*3/uL (ref 0–0.7)
Eosinophils Relative: 0 %
HEMATOCRIT: 42.8 % (ref 40.0–52.0)
HEMOGLOBIN: 15.3 g/dL (ref 13.0–18.0)
LYMPHS ABS: 1 10*3/uL (ref 1.0–3.6)
LYMPHS PCT: 14 %
MCH: 29.9 pg (ref 26.0–34.0)
MCHC: 35.7 g/dL (ref 32.0–36.0)
MCV: 83.6 fL (ref 80.0–100.0)
Monocytes Absolute: 0.4 10*3/uL (ref 0.2–1.0)
Monocytes Relative: 6 %
NEUTROS ABS: 5.5 10*3/uL (ref 1.4–6.5)
NEUTROS PCT: 80 %
PLATELETS: 146 10*3/uL — AB (ref 150–440)
RBC: 5.12 MIL/uL (ref 4.40–5.90)
RDW: 13.9 % (ref 11.5–14.5)
WBC: 6.9 10*3/uL (ref 3.8–10.6)

## 2016-11-24 MED ORDER — PREDNISONE 5 MG PO TABS: ORAL_TABLET | ORAL | 0 refills | Status: DC

## 2016-11-24 NOTE — Telephone Encounter (Signed)
Called caregiver to inform of continued need to taper prednisone as follow: 25mg  for the next 3 days then, decrease to 20mg  stay on 20mg  until patient appt on 9/21. She verbalized understanding of directions.

## 2016-11-24 NOTE — Telephone Encounter (Signed)
Lab & MD appt rschd, per patient request. (date & time, per patient) 11/24/16 MF

## 2016-11-25 ENCOUNTER — Inpatient Hospital Stay: Payer: Medicare Other

## 2016-12-02 ENCOUNTER — Other Ambulatory Visit: Payer: Medicare Other

## 2016-12-02 ENCOUNTER — Ambulatory Visit: Payer: Medicare Other | Admitting: Oncology

## 2016-12-03 ENCOUNTER — Inpatient Hospital Stay (HOSPITAL_BASED_OUTPATIENT_CLINIC_OR_DEPARTMENT_OTHER): Payer: Medicare Other | Admitting: Oncology

## 2016-12-03 ENCOUNTER — Telehealth: Payer: Self-pay | Admitting: *Deleted

## 2016-12-03 ENCOUNTER — Encounter: Payer: Self-pay | Admitting: Oncology

## 2016-12-03 ENCOUNTER — Inpatient Hospital Stay: Payer: Medicare Other

## 2016-12-03 ENCOUNTER — Other Ambulatory Visit: Payer: Self-pay | Admitting: *Deleted

## 2016-12-03 VITALS — BP 133/89 | HR 87 | Temp 97.8°F | Resp 12 | Ht 72.0 in | Wt 183.4 lb

## 2016-12-03 DIAGNOSIS — D61818 Other pancytopenia: Secondary | ICD-10-CM

## 2016-12-03 DIAGNOSIS — F840 Autistic disorder: Secondary | ICD-10-CM

## 2016-12-03 DIAGNOSIS — F419 Anxiety disorder, unspecified: Secondary | ICD-10-CM

## 2016-12-03 DIAGNOSIS — Z9081 Acquired absence of spleen: Secondary | ICD-10-CM | POA: Diagnosis not present

## 2016-12-03 LAB — CBC WITH DIFFERENTIAL/PLATELET
Basophils Absolute: 0 10*3/uL (ref 0–0.1)
Basophils Relative: 0 %
EOS ABS: 0 10*3/uL (ref 0–0.7)
Eosinophils Relative: 0 %
HEMATOCRIT: 43.4 % (ref 40.0–52.0)
HEMOGLOBIN: 15.2 g/dL (ref 13.0–18.0)
LYMPHS ABS: 0.5 10*3/uL — AB (ref 1.0–3.6)
Lymphocytes Relative: 11 %
MCH: 29.8 pg (ref 26.0–34.0)
MCHC: 35 g/dL (ref 32.0–36.0)
MCV: 85.1 fL (ref 80.0–100.0)
MONOS PCT: 3 %
Monocytes Absolute: 0.1 10*3/uL — ABNORMAL LOW (ref 0.2–1.0)
NEUTROS ABS: 3.9 10*3/uL (ref 1.4–6.5)
NEUTROS PCT: 86 %
Platelets: 163 10*3/uL (ref 150–440)
RBC: 5.1 MIL/uL (ref 4.40–5.90)
RDW: 14 % (ref 11.5–14.5)
WBC: 4.6 10*3/uL (ref 3.8–10.6)

## 2016-12-03 LAB — COMPREHENSIVE METABOLIC PANEL
ALBUMIN: 4.5 g/dL (ref 3.5–5.0)
ALK PHOS: 36 U/L — AB (ref 38–126)
ALT: 14 U/L — ABNORMAL LOW (ref 17–63)
ANION GAP: 9 (ref 5–15)
AST: 21 U/L (ref 15–41)
BUN: 12 mg/dL (ref 6–20)
CALCIUM: 9.2 mg/dL (ref 8.9–10.3)
CO2: 27 mmol/L (ref 22–32)
Chloride: 102 mmol/L (ref 101–111)
Creatinine, Ser: 0.83 mg/dL (ref 0.61–1.24)
GFR calc non Af Amer: >60 mL/min (ref >60)
GLUCOSE: 117 mg/dL — AB (ref 65–99)
Potassium: 3.5 mmol/L (ref 3.5–5.1)
SODIUM: 138 mmol/L (ref 135–145)
Total Bilirubin: 0.6 mg/dL (ref 0.3–1.2)
Total Protein: 7 g/dL (ref 6.5–8.1)

## 2016-12-03 MED ORDER — PREDNISONE 5 MG PO TABS: ORAL_TABLET | ORAL | 0 refills | Status: AC

## 2016-12-03 NOTE — Progress Notes (Signed)
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 He has a history of autism and intellectual visibility and currently lives in a group home for the last 7 months. His medical decision maker is [name] from [place] office.   Blood work from 04/28/2016 showed white count of 2.1 with an H&H of 15.5/45.1 and a platelet count of 51. CMP was within normal limits. TSH was normal at 1.09. At this time I do not have any prior CBCs for comparison.  Patient is here with his group home Asst. [name] today who reports that patient has been doing well over the last 7 months and has no known medical problems. He does not take any medications or over-the-counter medications. He has been feeling well and has had no sick days and denies any complaints.Patient is comfortable and is able to give Limited history and denies any complaints at this time his mother died of cancer (unknown which one). Other family history is not known.  Bone marrow biopsy on 05/14/2016 showed normal trilineage hematopoiesis with no evidence of lymphoma or leukemia. Peripheral flow cytometry showed reversal of CD4 to CD8 ratio. HIV hepatitis B and hepatitis C testing was negative. B12 and folate were within normal limits. PT/PTT INR was within normal limits.  Review of peripheral smear showed leukopenia and moderate neutropenia. Thrombocytopenia. RBC morphology is normal. Scattered reactive appearing lymphocytes are seen. No  blasts or early forms noted.  Monospot testing was positive. ANA was negative. Quantitative immunoglobulins were within normal limits. Serum copper was within normal limits. CMV DNA PCR negative  Patient was started on empiric high dose steroids for possible autoimmune etiology on 05/28/16. His counts normalized. Then his steroid taper was started. His WBC remained normal but his platelets began to decline 60's.   Plan was to restart full dose steroids and switch to rituxan second line after receiving pre splenectomy vaccines in anticipation of splenectomy should he not respond to rituxan  Patient has received all his pre splenectomy vaccines. He starts rituxan on 10/14/16. Seen at [facility] for second opinion by [name] who agrees with the plan.  [name] and is on slow steroid taper   Interval history- he is doing well. Denies any complaints. He is currently on prednisone 20 mg  ECOG PS- 0 Pain scale- 0   Review of systems- Review of Systems  Constitutional: Negative for chills, fever, malaise/fatigue and weight loss.  HENT: Negative for congestion, ear discharge and nosebleeds.   Eyes: Negative for blurred vision.  Respiratory: Negative for cough, hemoptysis, sputum production, shortness of breath and wheezing.   Cardiovascular: Negative for chest pain, palpitations, orthopnea and claudication.  Gastrointestinal: Negative for abdominal pain, blood in stool, constipation, diarrhea, heartburn, melena, nausea and vomiting.  Genitourinary: Negative for dysuria, flank pain, frequency, hematuria and urgency.  Musculoskeletal: Negative for back pain, joint pain and myalgias.  Skin: Negative for rash.  Neurological: Negative for dizziness, tingling, focal weakness, seizures, weakness and headaches.  Endo/Heme/Allergies: Does not bruise/bleed easily.  Psychiatric/Behavioral: Negative  for depression and suicidal ideas. The patient does not have insomnia.        No Known Allergies   Past Medical History:  Diagnosis Date  . Anxiety   . Autism   . Autism      History reviewed. No pertinent surgical history.  Social History   Social History  . Marital status: Single    Spouse name: N/A  . Number of children: N/A  . Years of education: N/A   Occupational History  . Not on file.   Social History Main Topics  . Smoking status: Never Smoker  . Smokeless tobacco: Never Used  . Alcohol use No  . Drug use: No  . Sexual activity: No   Other Topics Concern  . Not on file   Social History Narrative  . No narrative on file    Family History  Problem Relation Age of Onset  . Cancer Mother      Current Outpatient Prescriptions:  .  Calcium 600-400 MG-UNIT CHEW, Chew 1 tablet by mouth 2 (two) times daily., Disp: 60 tablet, Rfl: 2 .  pantoprazole (PROTONIX) 20 MG tablet, TAKE 1 TABLET BY MOUTH ONCE DAILY., Disp: 28 tablet, Rfl: 0 .  predniSONE (DELTASONE) 5 MG tablet, Take 5   5 mg tabs times 3 days then Take 4   5 mg tabs times 7 days, Disp: 43 tablet, Rfl: 0 .  sulfamethoxazole-trimethoprim (BACTRIM DS,SEPTRA DS) 800-160 MG tablet, TAKE 1 TABLET THREE TIMES A WEEK, Disp: 12 tablet, Rfl: 0  Physical exam:  Vitals:   12/03/16 1330  BP: 133/89  Pulse: 87  Resp: 12  Temp: 97.8 F (36.6 C)  TempSrc: Tympanic  Weight: 183 lb 6.4 oz (83.2 kg)  Height: 6' (1.829 m)   Physical Exam  Constitutional: He is oriented to person, place, and time and well-developed, well-nourished, and in no distress.  HENT:  Head: Normocephalic and atraumatic.  Eyes: Pupils are equal, round, and reactive to light. EOM are normal.  Neck: Normal range of motion.  Cardiovascular: Normal rate, regular rhythm and normal heart sounds.   Pulmonary/Chest: Effort normal and breath sounds normal.  Abdominal: Soft. Bowel sounds are normal.  Neurological: He is alert and oriented to person, place, and time.  Skin: Skin is warm and dry.     CMP Latest  Ref Rng & Units 12/03/2016  Glucose 65 - 99 mg/dL 117(H)  BUN 6 - 20 mg/dL 12  Creatinine 0.61 - 1.24 mg/dL 0.83  Sodium 135 - 145 mmol/L 138  Potassium 3.5 - 5.1 mmol/L 3.5  Chloride 101 - 111 mmol/L 102  CO2 22 - 32 mmol/L 27  Calcium 8.9 - 10.3 mg/dL 9.2  Total Protein 6.5 - 8.1 g/dL 7.0  Total Bilirubin 0.3 - 1.2 mg/dL 0.6  Alkaline Phos 38 - 126 U/L 36(L)  AST 15 - 41 U/L 21  ALT 17 - 63 U/L 14(L)   CBC Latest Ref Rng & Units 12/03/2016  WBC 3.8 - 10.6 K/uL 4.6  Hemoglobin 13.0 - 18.0 g/dL 15.2  Hematocrit 40.0 - 52.0 % 43.4  Platelets 150 - 440 K/uL 163     Assessment and plan- Patient is a 26 y.o. 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 He was therefore restarted on high dose steroids, he received pre splenectomy vaccines in anticipation of possible splenectomy should be not respond to rituxan. He then received 4 weekly doses of rituxan. Counts have normalized since  then and steroid taper was begun. Counts continue to be normal at this time. Continue steroid taper and decrease dose of prednisone starting at 15 mg tomorrow for 3 days followed by 10 mg for 3 days then 5 mg for 3 days then stop. Cbc with diff weekly X4.  I will see him back after 4 weeks with cbc/diff  He can stop his protonix and calcium/vit D once he is off setroids   Visit Diagnosis 1. Autoimmune pancytopenia ([place])   2. Other pancytopenia ([place])      [name], MD, MPH [place] at [facility] Pager- [phone] 12/03/2016 2:14 PM

## 2016-12-03 NOTE — Progress Notes (Signed)
Patient here for follow up.No changes since his last appointment. 

## 2016-12-03 NOTE — Telephone Encounter (Signed)
Called pharmacy and spoke to Lambert and made changes with prednisone. New directions : 15 mg daily for 3 days starting 9/22, then 10 mg daily for 3 days, then 5 mg daily for 3 days and then stop.  Pt and caregiver aware of changes in prednisone.

## 2016-12-10 ENCOUNTER — Inpatient Hospital Stay: Payer: Medicare Other

## 2016-12-16 ENCOUNTER — Inpatient Hospital Stay: Payer: Medicare Other | Attending: Oncology

## 2016-12-16 DIAGNOSIS — Z9081 Acquired absence of spleen: Secondary | ICD-10-CM | POA: Insufficient documentation

## 2016-12-16 DIAGNOSIS — F419 Anxiety disorder, unspecified: Secondary | ICD-10-CM | POA: Diagnosis not present

## 2016-12-16 DIAGNOSIS — F840 Autistic disorder: Secondary | ICD-10-CM | POA: Insufficient documentation

## 2016-12-16 DIAGNOSIS — D61818 Other pancytopenia: Secondary | ICD-10-CM | POA: Diagnosis not present

## 2016-12-16 LAB — CBC WITH DIFFERENTIAL/PLATELET
BASOS PCT: 1 %
Basophils Absolute: 0 10*3/uL (ref 0–0.1)
Eosinophils Absolute: 0.1 10*3/uL (ref 0–0.7)
Eosinophils Relative: 2 %
HEMATOCRIT: 43.3 % (ref 40.0–52.0)
Hemoglobin: 15.1 g/dL (ref 13.0–18.0)
LYMPHS ABS: 1.2 10*3/uL (ref 1.0–3.6)
Lymphocytes Relative: 33 %
MCH: 29.8 pg (ref 26.0–34.0)
MCHC: 35 g/dL (ref 32.0–36.0)
MCV: 85.3 fL (ref 80.0–100.0)
MONO ABS: 0.4 10*3/uL (ref 0.2–1.0)
MONOS PCT: 12 %
NEUTROS ABS: 1.9 10*3/uL (ref 1.4–6.5)
Neutrophils Relative %: 52 %
PLATELETS: 124 10*3/uL — AB (ref 150–440)
RBC: 5.08 MIL/uL (ref 4.40–5.90)
RDW: 13.9 % (ref 11.5–14.5)
WBC: 3.7 10*3/uL — ABNORMAL LOW (ref 3.8–10.6)

## 2016-12-16 LAB — COMPREHENSIVE METABOLIC PANEL
ALK PHOS: 46 U/L (ref 38–126)
ALT: 14 U/L — ABNORMAL LOW (ref 17–63)
ANION GAP: 8 (ref 5–15)
AST: 19 U/L (ref 15–41)
Albumin: 4.4 g/dL (ref 3.5–5.0)
BILIRUBIN TOTAL: 0.9 mg/dL (ref 0.3–1.2)
BUN: 9 mg/dL (ref 6–20)
CALCIUM: 9.6 mg/dL (ref 8.9–10.3)
CO2: 30 mmol/L (ref 22–32)
Chloride: 104 mmol/L (ref 101–111)
Creatinine, Ser: 0.62 mg/dL (ref 0.61–1.24)
GFR calc non Af Amer: >60 mL/min (ref >60)
GLUCOSE: 59 mg/dL — AB (ref 65–99)
Potassium: 3.8 mmol/L (ref 3.5–5.1)
Sodium: 142 mmol/L (ref 135–145)
TOTAL PROTEIN: 7.2 g/dL (ref 6.5–8.1)

## 2016-12-17 ENCOUNTER — Inpatient Hospital Stay: Payer: Medicare Other

## 2016-12-22 ENCOUNTER — Other Ambulatory Visit: Payer: Self-pay | Admitting: Oncology

## 2016-12-22 DIAGNOSIS — D61818 Other pancytopenia: Secondary | ICD-10-CM

## 2016-12-24 ENCOUNTER — Inpatient Hospital Stay: Payer: Medicare Other

## 2016-12-24 DIAGNOSIS — F419 Anxiety disorder, unspecified: Secondary | ICD-10-CM | POA: Diagnosis not present

## 2016-12-24 DIAGNOSIS — D61818 Other pancytopenia: Secondary | ICD-10-CM

## 2016-12-24 DIAGNOSIS — Z9081 Acquired absence of spleen: Secondary | ICD-10-CM | POA: Diagnosis not present

## 2016-12-24 DIAGNOSIS — F840 Autistic disorder: Secondary | ICD-10-CM | POA: Diagnosis not present

## 2016-12-24 LAB — CBC WITH DIFFERENTIAL/PLATELET
BASOS PCT: 1 %
Basophils Absolute: 0 10*3/uL (ref 0–0.1)
EOS ABS: 0.1 10*3/uL (ref 0–0.7)
EOS PCT: 2 %
HCT: 44.3 % (ref 40.0–52.0)
HEMOGLOBIN: 15.5 g/dL (ref 13.0–18.0)
LYMPHS ABS: 1.3 10*3/uL (ref 1.0–3.6)
Lymphocytes Relative: 26 %
MCH: 29.8 pg (ref 26.0–34.0)
MCHC: 34.9 g/dL (ref 32.0–36.0)
MCV: 85.2 fL (ref 80.0–100.0)
Monocytes Absolute: 0.5 10*3/uL (ref 0.2–1.0)
Monocytes Relative: 9 %
NEUTROS PCT: 62 %
Neutro Abs: 3.1 10*3/uL (ref 1.4–6.5)
PLATELETS: 168 10*3/uL (ref 150–440)
RBC: 5.2 MIL/uL (ref 4.40–5.90)
RDW: 13.5 % (ref 11.5–14.5)
WBC: 5 10*3/uL (ref 3.8–10.6)

## 2016-12-31 ENCOUNTER — Encounter: Payer: Self-pay | Admitting: Oncology

## 2016-12-31 ENCOUNTER — Inpatient Hospital Stay (HOSPITAL_BASED_OUTPATIENT_CLINIC_OR_DEPARTMENT_OTHER): Payer: Medicare Other | Admitting: Oncology

## 2016-12-31 ENCOUNTER — Inpatient Hospital Stay: Payer: Medicare Other

## 2016-12-31 VITALS — BP 136/93 | HR 71 | Temp 97.6°F | Resp 14 | Wt 187.0 lb

## 2016-12-31 DIAGNOSIS — F840 Autistic disorder: Secondary | ICD-10-CM | POA: Diagnosis not present

## 2016-12-31 DIAGNOSIS — D61818 Other pancytopenia: Secondary | ICD-10-CM

## 2016-12-31 DIAGNOSIS — F419 Anxiety disorder, unspecified: Secondary | ICD-10-CM | POA: Diagnosis not present

## 2016-12-31 DIAGNOSIS — Z9081 Acquired absence of spleen: Secondary | ICD-10-CM | POA: Diagnosis not present

## 2016-12-31 LAB — CBC WITH DIFFERENTIAL/PLATELET
BASOS ABS: 0 10*3/uL (ref 0–0.1)
BASOS PCT: 1 %
EOS PCT: 1 %
Eosinophils Absolute: 0 10*3/uL (ref 0–0.7)
HCT: 43.2 % (ref 40.0–52.0)
Hemoglobin: 14.8 g/dL (ref 13.0–18.0)
LYMPHS PCT: 28 %
Lymphs Abs: 1.4 10*3/uL (ref 1.0–3.6)
MCH: 29.3 pg (ref 26.0–34.0)
MCHC: 34.3 g/dL (ref 32.0–36.0)
MCV: 85.4 fL (ref 80.0–100.0)
MONO ABS: 0.5 10*3/uL (ref 0.2–1.0)
Monocytes Relative: 11 %
Neutro Abs: 2.9 10*3/uL (ref 1.4–6.5)
Neutrophils Relative %: 59 %
PLATELETS: 168 10*3/uL (ref 150–440)
RBC: 5.06 MIL/uL (ref 4.40–5.90)
RDW: 13.4 % (ref 11.5–14.5)
WBC: 4.9 10*3/uL (ref 3.8–10.6)

## 2016-12-31 NOTE — Progress Notes (Signed)
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 He has a history of autism and intellectual visibility and currently lives in a group home for the last 7 months. His medical decision maker is [name] from [place] office.   Blood work from 04/28/2016 showed white count of 2.1 with an H&H of 15.5/45.1 and a platelet count of 51. CMP was within normal limits. TSH was normal at 1.09. At this time I do not have any prior CBCs for comparison.  Patient is here with his group home Asst. [name] today who reports that patient has been doing well over the last 7 months and has no known medical problems. He does not take any medications or over-the-counter medications. He has been feeling well and has had no sick days and denies any complaints.Patient is comfortable and is able to give Limited history and denies any complaints at this time his mother died of cancer (unknown which one). Other family history is not known.  Bone marrow biopsy on 05/14/2016 showed normal trilineage hematopoiesis with no evidence of lymphoma or leukemia. Peripheral flow cytometry showed reversal of CD4 to CD8 ratio. HIV hepatitis B and hepatitis C testing was negative. B12 and folate were within normal limits. PT/PTT INR was within normal limits.  Review of peripheral smear showed leukopenia and moderate neutropenia. Thrombocytopenia. RBC morphology is normal. Scattered reactive appearing lymphocytes are seen. No  blasts or early forms noted.  Monospot testing was positive. ANA was negative. Quantitative immunoglobulins were within normal limits. Serum copper was within normal limits. CMV DNA PCR negative  Patient was started on empiric high dose steroids for possible autoimmune etiology on 05/28/16. His counts normalized. Then his steroid taper was started. His WBC remained normal but his platelets began to decline 60's.   Plan was to restart full dose steroids and switch to rituxan second line after receiving pre splenectomy vaccines in anticipation of splenectomy should he not respond to rituxan  Patient has received all his pre splenectomy vaccines. He starts rituxan on 10/14/16. Seen at [facility] for second opinion by [name] who agrees with the plan.  [name] and followed by slow steroid taper. Steroids were stopped in September 2018   Interval history- he is doing well. He has gained 4 pounds of weight. Denies any complaints  ECOG PS- 0 Pain scale- 0   Review of systems- Review of Systems  Constitutional: Negative for chills, fever, malaise/fatigue and weight loss.  HENT: Negative for congestion, ear discharge and nosebleeds.   Eyes: Negative for blurred vision.  Respiratory: Negative for cough, hemoptysis, sputum production, shortness of breath and wheezing.   Cardiovascular: Negative for chest pain, palpitations, orthopnea and claudication.  Gastrointestinal: Negative for abdominal pain, blood in stool, constipation, diarrhea, heartburn, melena, nausea and vomiting.  Genitourinary: Negative for dysuria, flank pain, frequency, hematuria and urgency.  Musculoskeletal: Negative for back pain, joint pain and myalgias.  Skin: Negative for rash.  Neurological: Negative for dizziness, tingling, focal weakness, seizures, weakness and headaches.  Endo/Heme/Allergies: Does  not bruise/bleed easily.  Psychiatric/Behavioral: Negative for depression and suicidal  ideas. The patient does not have insomnia.       No Known Allergies   Past Medical History:  Diagnosis Date  . Anxiety   . Autism   . Autism      No past surgical history on file.  Social History   Social History  . Marital status: Single    Spouse name: N/A  . Number of children: N/A  . Years of education: N/A   Occupational History  . Not on file.   Social History Main Topics  . Smoking status: Never Smoker  . Smokeless tobacco: Never Used  . Alcohol use No  . Drug use: No  . Sexual activity: No   Other Topics Concern  . Not on file   Social History Narrative  . No narrative on file    Family History  Problem Relation Age of Onset  . Cancer Mother      Current Outpatient Prescriptions:  .  Calcium 600-400 MG-UNIT CHEW, Chew 1 tablet by mouth 2 (two) times daily., Disp: 60 tablet, Rfl: 2 .  pantoprazole (PROTONIX) 20 MG tablet, TAKE 1 TABLET BY MOUTH ONCE DAILY., Disp: 30 tablet, Rfl: 0 .  sulfamethoxazole-trimethoprim (BACTRIM DS,SEPTRA DS) 800-160 MG tablet, TAKE 1 TABLET THREE TIMES A WEEK, Disp: 12 tablet, Rfl: 0  Physical exam:  Vitals:   12/31/16 0930 12/31/16 0932  BP:  (!) 136/93  Pulse:  71  Resp:  14  Temp:  97.6 F (36.4 C)  TempSrc:  Tympanic  Weight: 187 lb 6.4 oz (85 kg) 187 lb (84.8 kg)   Physical Exam  Constitutional: He is oriented to person, place, and time and well-developed, well-nourished, and in no distress.  HENT:  Head: Normocephalic and atraumatic.  Eyes: Pupils are equal, round, and reactive to light. EOM are normal.  Neck: Normal range of motion.  Cardiovascular: Normal rate, regular rhythm and normal heart sounds.   Pulmonary/Chest: Effort normal and breath sounds normal.  Abdominal: Soft. Bowel sounds are normal.  Neurological: He is alert and oriented to person, place, and time.  Skin: Skin is warm and dry.     CMP Latest Ref Rng & Units 12/16/2016  Glucose 65 - 99 mg/dL 59(L)  BUN 6 - 20 mg/dL 9  Creatinine  0.61 - 1.24 mg/dL 0.62  Sodium 135 - 145 mmol/L 142  Potassium 3.5 - 5.1 mmol/L 3.8  Chloride 101 - 111 mmol/L 104  CO2 22 - 32 mmol/L 30  Calcium 8.9 - 10.3 mg/dL 9.6  Total Protein 6.5 - 8.1 g/dL 7.2  Total Bilirubin 0.3 - 1.2 mg/dL 0.9  Alkaline Phos 38 - 126 U/L 46  AST 15 - 41 U/L 19  ALT 17 - 63 U/L 14(L)   CBC Latest Ref Rng & Units 12/31/2016  WBC 3.8 - 10.6 K/uL 4.9  Hemoglobin 13.0 - 18.0 g/dL 14.8  Hematocrit 40.0 - 52.0 % 43.2  Platelets 150 - 440 K/uL 168    Assessment and plan- Patient is a 25 y.o. 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 We will repeat cbc in 2 weeks and then in 1 month and 2 months. I will see him back in 3 months with cbc with diff   Visit Diagnosis 1. Autoimmune pancytopenia ([facility])      Dr. Archana Rao, MD, MPH [facility] at [facility] Pager- [phone]   12/31/2016 12:22 PM

## 2016-12-31 NOTE — Progress Notes (Signed)
Patient is here today for a follow up. Patient states no new concerns today.  

## 2017-01-14 ENCOUNTER — Inpatient Hospital Stay: Payer: Medicare Other | Attending: Oncology

## 2017-01-14 DIAGNOSIS — Z79899 Other long term (current) drug therapy: Secondary | ICD-10-CM | POA: Diagnosis not present

## 2017-01-14 DIAGNOSIS — Z9081 Acquired absence of spleen: Secondary | ICD-10-CM | POA: Diagnosis not present

## 2017-01-14 DIAGNOSIS — F419 Anxiety disorder, unspecified: Secondary | ICD-10-CM | POA: Insufficient documentation

## 2017-01-14 DIAGNOSIS — D61818 Other pancytopenia: Secondary | ICD-10-CM | POA: Insufficient documentation

## 2017-01-14 DIAGNOSIS — F840 Autistic disorder: Secondary | ICD-10-CM | POA: Diagnosis not present

## 2017-01-14 DIAGNOSIS — R5383 Other fatigue: Secondary | ICD-10-CM | POA: Diagnosis not present

## 2017-01-14 DIAGNOSIS — M791 Myalgia, unspecified site: Secondary | ICD-10-CM | POA: Insufficient documentation

## 2017-01-14 DIAGNOSIS — R6889 Other general symptoms and signs: Secondary | ICD-10-CM | POA: Diagnosis not present

## 2017-01-14 LAB — CBC WITH DIFFERENTIAL/PLATELET
BASOS ABS: 0 10*3/uL (ref 0–0.1)
Basophils Relative: 1 %
EOS ABS: 0.1 10*3/uL (ref 0–0.7)
Eosinophils Relative: 3 %
HEMATOCRIT: 42.3 % (ref 40.0–52.0)
Hemoglobin: 14.5 g/dL (ref 13.0–18.0)
LYMPHS ABS: 1.3 10*3/uL (ref 1.0–3.6)
LYMPHS PCT: 31 %
MCH: 29.4 pg (ref 26.0–34.0)
MCHC: 34.3 g/dL (ref 32.0–36.0)
MCV: 85.7 fL (ref 80.0–100.0)
Monocytes Absolute: 0.4 10*3/uL (ref 0.2–1.0)
Monocytes Relative: 10 %
NEUTROS PCT: 55 %
Neutro Abs: 2.3 10*3/uL (ref 1.4–6.5)
PLATELETS: 168 10*3/uL (ref 150–440)
RBC: 4.94 MIL/uL (ref 4.40–5.90)
RDW: 13.1 % (ref 11.5–14.5)
WBC: 4.1 10*3/uL (ref 3.8–10.6)

## 2017-01-18 ENCOUNTER — Telehealth: Payer: Self-pay | Admitting: *Deleted

## 2017-01-18 NOTE — Telephone Encounter (Signed)
Toniann FailWendy called stating patient is feeling very tired and having back pain Denies fever, appetite good, but he just does not feel well. He is not himself lately. Asking if he can be seen. Please advise

## 2017-01-19 ENCOUNTER — Inpatient Hospital Stay: Payer: Medicare Other | Admitting: *Deleted

## 2017-01-19 ENCOUNTER — Inpatient Hospital Stay (HOSPITAL_BASED_OUTPATIENT_CLINIC_OR_DEPARTMENT_OTHER): Payer: Medicare Other | Admitting: Oncology

## 2017-01-19 ENCOUNTER — Encounter: Payer: Self-pay | Admitting: Oncology

## 2017-01-19 VITALS — BP 122/84 | HR 69 | Temp 98.0°F | Resp 18 | Ht 72.0 in | Wt 185.4 lb

## 2017-01-19 DIAGNOSIS — R5383 Other fatigue: Secondary | ICD-10-CM

## 2017-01-19 DIAGNOSIS — R6889 Other general symptoms and signs: Secondary | ICD-10-CM

## 2017-01-19 DIAGNOSIS — F419 Anxiety disorder, unspecified: Secondary | ICD-10-CM | POA: Diagnosis not present

## 2017-01-19 DIAGNOSIS — M791 Myalgia, unspecified site: Secondary | ICD-10-CM

## 2017-01-19 DIAGNOSIS — D61818 Other pancytopenia: Secondary | ICD-10-CM

## 2017-01-19 DIAGNOSIS — F840 Autistic disorder: Secondary | ICD-10-CM | POA: Diagnosis not present

## 2017-01-19 LAB — CBC WITH DIFFERENTIAL/PLATELET
Basophils Absolute: 0 10*3/uL (ref 0–0.1)
Basophils Relative: 1 %
EOS ABS: 0.1 10*3/uL (ref 0–0.7)
Eosinophils Relative: 2 %
HEMATOCRIT: 43.3 % (ref 40.0–52.0)
HEMOGLOBIN: 15.2 g/dL (ref 13.0–18.0)
LYMPHS ABS: 1.1 10*3/uL (ref 1.0–3.6)
Lymphocytes Relative: 30 %
MCH: 29.6 pg (ref 26.0–34.0)
MCHC: 35.1 g/dL (ref 32.0–36.0)
MCV: 84.5 fL (ref 80.0–100.0)
MONO ABS: 0.4 10*3/uL (ref 0.2–1.0)
MONOS PCT: 11 %
NEUTROS PCT: 56 %
Neutro Abs: 2 10*3/uL (ref 1.4–6.5)
Platelets: 152 10*3/uL (ref 150–440)
RBC: 5.12 MIL/uL (ref 4.40–5.90)
RDW: 12.8 % (ref 11.5–14.5)
WBC: 3.6 10*3/uL — ABNORMAL LOW (ref 3.8–10.6)

## 2017-01-19 LAB — CORTISOL: CORTISOL PLASMA: 8.1 ug/dL

## 2017-01-19 LAB — COMPREHENSIVE METABOLIC PANEL
ALBUMIN: 4.6 g/dL (ref 3.5–5.0)
ALK PHOS: 67 U/L (ref 38–126)
ALT: 20 U/L (ref 17–63)
AST: 22 U/L (ref 15–41)
Anion gap: 6 (ref 5–15)
BUN: 13 mg/dL (ref 6–20)
CHLORIDE: 102 mmol/L (ref 101–111)
CO2: 30 mmol/L (ref 22–32)
CREATININE: 0.65 mg/dL (ref 0.61–1.24)
Calcium: 9.4 mg/dL (ref 8.9–10.3)
GFR calc non Af Amer: >60 mL/min (ref >60)
GLUCOSE: 78 mg/dL (ref 65–99)
Potassium: 4 mmol/L (ref 3.5–5.1)
SODIUM: 138 mmol/L (ref 135–145)
Total Bilirubin: 0.9 mg/dL (ref 0.3–1.2)
Total Protein: 7.3 g/dL (ref 6.5–8.1)

## 2017-01-19 NOTE — Progress Notes (Signed)
Pt is cold all the time. He goes to bed at 8 pm.  He has a part time job and the supervisor noticed that he is resting on stacks of things at work because he is tired. He is usually energetic and plays sports he is tired.  He even falls asleep on the way home from work.  Back pain for 2 days.

## 2017-01-19 NOTE — Telephone Encounter (Signed)
Per Hetty ElyJ Burns, NP have patient come in for CBC, CMP and see her. Toniann FailWendy accepted an appointment for 10 this morning

## 2017-01-19 NOTE — Telephone Encounter (Signed)
His last cbc was normal. He should see his pcp

## 2017-01-19 NOTE — Progress Notes (Signed)
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 He has a history of autism and intellectual visibility and currently lives in a group home for the last 7 months. His medical decision maker is Mr. Bobby Dean from [place] office.   Blood work from 04/28/2016 showed white count of 2.1 with an H&H of 15.5/45.1 and a platelet count of 51. CMP was within normal limits. TSH was normal at 1.09. At this time I do not have any prior CBCs for comparison.  Patient is here with his group home Asst. Mrs. Bobby Dean today who reports that patient has been doing well over the last 7 months and has no known medical problems. He does not take any medications or over-the-counter medications. He has been feeling well and has had no sick days and denies any complaints.Patient is comfortable and is able to give Limited history and denies any complaints at this time his mother died of cancer (unknown which one). Other family history is not known.  Bone marrow biopsy on 05/14/2016 showed normal trilineage hematopoiesis with no evidence of lymphoma or leukemia. Peripheral flow cytometry showed reversal of CD4 to CD8 ratio. HIV hepatitis B and hepatitis C testing was negative. B12 and folate were within normal limits. PT/PTT INR was within normal limits.  Review of peripheral smear showed leukopenia and moderate neutropenia. Thrombocytopenia. RBC morphology is normal. Scattered reactive appearing lymphocytes are  seen. No blasts or early forms noted.  Monospot testing was positive. ANA was negative. Quantitative immunoglobulins were within normal limits. Serum copper was within normal limits. CMV DNA PCR negative  Patient was started on empiric high dose steroids for possible autoimmune etiology on 05/28/16. His counts normalized. Then his steroid taper was started. His WBC remained normal but his platelets began to decline 60's.   Plan was to restart full dose steroids and switch to rituxan second line after receiving pre splenectomy vaccines in anticipation of splenectomy should he not respond to rituxan  Patient has received all his pre splenectomy vaccines. He starts rituxan on 10/14/16. Seen at [facility] for second opinion by Dr. Evelene Dean who agrees with the plan.  [name] and followed by slow steroid taper. Steroids were stopped in September 2018  Interval history- Patient presents today with group home caregiver with complaints of severe fatigue, generalized weakness and cold sensitivity. Patient's caregiver states that over the past month patient has become progressively more fatigued daily, sleeping upwards of 12 hours per night and "leaning and resting on furniture" while working throughout the day. Additionally, he has been falling asleep in the car on the way home from work which is unusual for him. She also states he is generally weak but complains of significant weakness in bilateral lower extremities. Patient states he does not have a problem going to sleep at night and stays asleept. Lastly, he states he is always cold and sometimes sleeps in a winter jacket under his sheets or at night. Caregiver and patient denies any new medications and he is currently taking no medications. He denies  drinking alcohol or using drugs. His appetite is good. He denies fevers, vomiting, nausea and abdominal pain.   ECOG FS:1 - Symptomatic but completely ambulatory  Review of  systems- Review of Systems  Constitutional: Positive for malaise/fatigue. Negative for chills, fever and weight loss.  HENT: Negative.   Eyes: Negative.   Respiratory: Negative.   Cardiovascular: Negative.   Gastrointestinal: Negative.   Genitourinary: Negative.   Musculoskeletal: Positive for joint pain.       Bilateral knee pain  Skin: Negative.   Neurological: Positive for weakness.  Endo/Heme/Allergies: Negative.   Psychiatric/Behavioral: Negative.      Current treatment- S/P 4 cycles of Rituxin  No Known Allergies   Past Medical History:  Diagnosis Date  . Anxiety   . Autism   . Autism   . 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 No pertinent surgical history.  Social History   Socioeconomic History  . Marital status: Single    Spouse name: Not on file  . Number of children: Not on file  . Years of education: Not on file  . Highest education level: Not on file  Social Needs  . Financial resource strain: Not on file  . Food insecurity - worry: Not on file  . Food insecurity - inability: Not on file  . Transportation needs - medical: Not on file  . Transportation needs - non-medical: Not on file  Occupational History  . Not on file  Tobacco Use  . Smoking status: Never Smoker  . Smokeless tobacco: Never Used  Substance and Sexual Activity  . Alcohol use: No  . Drug use: No  . Sexual activity: No  Other Topics Concern  . Not on file  Social History Narrative  . Not on file    Family History  Problem Relation Age of Onset  . Cancer Mother     No current outpatient medications on file.  Physical exam:  Vitals:   01/19/17 1021  BP: 122/84  Pulse: 69  Resp: 18  Temp: 98 F (36.7 C)  TempSrc: Tympanic  Weight: 185 lb 6.4 oz (84.1 kg)  Height: 6' (1.829 m)   Physical Exam  Constitutional: He is oriented to person, place, and time and well-developed, well-nourished, and in no distress. Vital signs are normal.  HENT:  Head:  Normocephalic and atraumatic.  Eyes: Conjunctivae are normal. Pupils are equal, round, and reactive to light.  Neck: Normal range of motion. Neck supple.  Cardiovascular: Normal rate, regular rhythm, S1 normal, S2 normal, normal heart sounds and intact distal pulses.  Pulmonary/Chest: Effort normal and breath sounds normal.  Abdominal: Soft. Normal appearance and bowel sounds are normal.  Musculoskeletal: Normal range of motion. He exhibits tenderness.       Right knee: Tenderness found.       Left knee: Tenderness found.  Neurological: He is alert and oriented to person, place, and time. Gait normal.  Skin: Skin is warm, dry and intact.  Psychiatric: Affect normal.     CMP Latest Ref Rng & Units 01/19/2017  Glucose 65 - 99 mg/dL 78  BUN 6 - 20 mg/dL 13  Creatinine 0.61 - 1.24 mg/dL 0.65  Sodium 135 - 145 mmol/L 138  Potassium 3.5 - 5.1 mmol/L 4.0  Chloride 101 - 111 mmol/L 102  CO2 22 - 32 mmol/L 30  Calcium 8.9 - 10.3 mg/dL 9.4  Total Protein 6.5 - 8.1 g/dL 7.3  Total Bilirubin 0.3 - 1.2 mg/dL 0.9  Alkaline Phos  38 - 126 U/L 67  AST 15 - 41 U/L 22  ALT 17 - 63 U/L 20   CBC Latest Ref Rng & Units 01/19/2017  WBC 3.8 - 10.6 K/uL 3.6(L)  Hemoglobin 13.0 - 18.0 g/dL 15.2  Hematocrit 40.0 - 52.0 % 43.3  Platelets 150 - 440 K/uL 152    No images are attached to the encounter.  No results found.   Assessment and plan- Patient is a 26 y.o. male he presents with significant fatigue, weakness and cold sensitivity. On examination, patient is sitting quietly in an examination chair. He does not appear to be in any distress. He answers questions appropriately. Physical examination and labs are unremarkable. Vital signs are stable. Weight is stable. Patient's range of motion in bilateral lower extremities is normal without pain. Patient's lungs are clear and heart sounds are normal. No organomegaly or tenderness of abdomen during examination.   1. Fatigue/Weakness: Non-specific  complaints. Patient was tapered appropriately off high dose prednisone ending approximately at the end of September. Lab work is normal. Added cortisol level, TSH and CK-MB. Encouraged good sleep hygiene. Encouraged patient if this continues to see  PCP for further workup.  2. Myalgias: Non-specific. Continue to monitor.  2. Cold Sensitivity: Lab work is normal. No signs of anemia. 3. RTC as scheduled for labs each month and the to see Dr. Janese Banks in January 2019.    Visit Diagnosis 1. Other fatigue   2. Myalgia   3. Cold sensitivity     Patient expressed understanding and was in agreement with this plan. He also understands that He can call clinic at any time with any questions, concerns, or complaints.   Greater than 50% was spent in counseling and coordination of care with this patient including but not limited to discussion of the relevant topics above (See A&P) including, but not limited to diagnosis and management of acute and chronic medical conditions.    Faythe Casa, AGNP-C [facility] [place]- [phone] Pager- [phone] 01/20/2017 1:54 PM

## 2017-01-20 LAB — ANA W/REFLEX: Anti Nuclear Antibody(ANA): NEGATIVE

## 2017-01-20 LAB — THYROID PANEL WITH TSH
Free Thyroxine Index: 1.8 (ref 1.2–4.9)
T3 UPTAKE RATIO: 26 % (ref 24–39)
T4 TOTAL: 7.1 ug/dL (ref 4.5–12.0)
TSH: 0.809 u[IU]/mL (ref 0.450–4.500)

## 2017-01-28 ENCOUNTER — Inpatient Hospital Stay: Payer: Medicare Other

## 2017-01-28 DIAGNOSIS — D61818 Other pancytopenia: Secondary | ICD-10-CM

## 2017-01-28 DIAGNOSIS — R6889 Other general symptoms and signs: Secondary | ICD-10-CM | POA: Diagnosis not present

## 2017-01-28 DIAGNOSIS — M791 Myalgia, unspecified site: Secondary | ICD-10-CM | POA: Diagnosis not present

## 2017-01-28 DIAGNOSIS — F840 Autistic disorder: Secondary | ICD-10-CM | POA: Diagnosis not present

## 2017-01-28 DIAGNOSIS — R5383 Other fatigue: Secondary | ICD-10-CM | POA: Diagnosis not present

## 2017-01-28 DIAGNOSIS — F419 Anxiety disorder, unspecified: Secondary | ICD-10-CM | POA: Diagnosis not present

## 2017-01-28 LAB — CBC WITH DIFFERENTIAL/PLATELET
BASOS ABS: 0 10*3/uL (ref 0–0.1)
Basophils Relative: 1 %
EOS ABS: 0.1 10*3/uL (ref 0–0.7)
EOS PCT: 4 %
HCT: 42.1 % (ref 40.0–52.0)
Hemoglobin: 14.3 g/dL (ref 13.0–18.0)
Lymphocytes Relative: 36 %
Lymphs Abs: 1.3 10*3/uL (ref 1.0–3.6)
MCH: 29 pg (ref 26.0–34.0)
MCHC: 34 g/dL (ref 32.0–36.0)
MCV: 85.1 fL (ref 80.0–100.0)
MONO ABS: 0.4 10*3/uL (ref 0.2–1.0)
Monocytes Relative: 11 %
Neutro Abs: 1.8 10*3/uL (ref 1.4–6.5)
Neutrophils Relative %: 48 %
PLATELETS: 151 10*3/uL (ref 150–440)
RBC: 4.94 MIL/uL (ref 4.40–5.90)
RDW: 12.6 % (ref 11.5–14.5)
WBC: 3.7 10*3/uL — AB (ref 3.8–10.6)

## 2017-02-28 ENCOUNTER — Inpatient Hospital Stay: Payer: Medicare Other | Attending: Oncology

## 2017-02-28 DIAGNOSIS — R5383 Other fatigue: Secondary | ICD-10-CM | POA: Diagnosis not present

## 2017-02-28 DIAGNOSIS — Z79899 Other long term (current) drug therapy: Secondary | ICD-10-CM | POA: Diagnosis not present

## 2017-02-28 DIAGNOSIS — R6889 Other general symptoms and signs: Secondary | ICD-10-CM | POA: Diagnosis not present

## 2017-02-28 DIAGNOSIS — Z9081 Acquired absence of spleen: Secondary | ICD-10-CM | POA: Insufficient documentation

## 2017-02-28 DIAGNOSIS — D61818 Other pancytopenia: Secondary | ICD-10-CM | POA: Diagnosis not present

## 2017-02-28 DIAGNOSIS — F419 Anxiety disorder, unspecified: Secondary | ICD-10-CM | POA: Diagnosis not present

## 2017-02-28 DIAGNOSIS — M791 Myalgia, unspecified site: Secondary | ICD-10-CM | POA: Diagnosis not present

## 2017-02-28 DIAGNOSIS — F840 Autistic disorder: Secondary | ICD-10-CM | POA: Insufficient documentation

## 2017-02-28 LAB — CBC WITH DIFFERENTIAL/PLATELET
BASOS PCT: 1 %
Basophils Absolute: 0 10*3/uL (ref 0–0.1)
Eosinophils Absolute: 0.1 10*3/uL (ref 0–0.7)
Eosinophils Relative: 4 %
HEMATOCRIT: 41.7 % (ref 40.0–52.0)
Hemoglobin: 14.2 g/dL (ref 13.0–18.0)
LYMPHS ABS: 1.1 10*3/uL (ref 1.0–3.6)
Lymphocytes Relative: 32 %
MCH: 29.3 pg (ref 26.0–34.0)
MCHC: 34.1 g/dL (ref 32.0–36.0)
MCV: 85.7 fL (ref 80.0–100.0)
MONO ABS: 0.4 10*3/uL (ref 0.2–1.0)
MONOS PCT: 11 %
NEUTROS ABS: 1.8 10*3/uL (ref 1.4–6.5)
Neutrophils Relative %: 52 %
Platelets: 153 10*3/uL (ref 150–440)
RBC: 4.87 MIL/uL (ref 4.40–5.90)
RDW: 12.8 % (ref 11.5–14.5)
WBC: 3.5 10*3/uL — ABNORMAL LOW (ref 3.8–10.6)

## 2017-04-01 ENCOUNTER — Inpatient Hospital Stay: Payer: Medicare Other | Attending: Oncology | Admitting: Oncology

## 2017-04-01 ENCOUNTER — Inpatient Hospital Stay: Payer: Medicare Other

## 2017-04-01 ENCOUNTER — Encounter: Payer: Self-pay | Admitting: Oncology

## 2017-04-01 VITALS — BP 124/85 | HR 64 | Temp 97.6°F | Resp 18 | Wt 192.7 lb

## 2017-04-01 DIAGNOSIS — D61818 Other pancytopenia: Secondary | ICD-10-CM | POA: Diagnosis not present

## 2017-04-01 LAB — CBC WITH DIFFERENTIAL/PLATELET
BASOS ABS: 0 10*3/uL (ref 0–0.1)
Basophils Relative: 1 %
EOS PCT: 2 %
Eosinophils Absolute: 0.1 10*3/uL (ref 0–0.7)
HCT: 45.5 % (ref 40.0–52.0)
HEMOGLOBIN: 15.2 g/dL (ref 13.0–18.0)
LYMPHS ABS: 1.4 10*3/uL (ref 1.0–3.6)
LYMPHS PCT: 35 %
MCH: 28.7 pg (ref 26.0–34.0)
MCHC: 33.5 g/dL (ref 32.0–36.0)
MCV: 85.5 fL (ref 80.0–100.0)
Monocytes Absolute: 0.4 10*3/uL (ref 0.2–1.0)
Monocytes Relative: 11 %
NEUTROS PCT: 51 %
Neutro Abs: 2 10*3/uL (ref 1.4–6.5)
PLATELETS: 145 10*3/uL — AB (ref 150–440)
RBC: 5.32 MIL/uL (ref 4.40–5.90)
RDW: 13 % (ref 11.5–14.5)
WBC: 3.9 10*3/uL (ref 3.8–10.6)

## 2017-04-01 NOTE — Progress Notes (Signed)
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 He has a history of autism and intellectual visibility and currently lives in a group home for the last 7 months. His medical decision maker is [name] from [place] office.   Blood work from 04/28/2016 showed white count of 2.1 with an H&H of 15.5/45.1 and a platelet count of 51. CMP was within normal limits. TSH was normal at 1.09. At this time I do not have any prior CBCs for comparison.  Patient is here with his group home Asst. [name] today who reports that patient has been doing well over the last 7 months and has no known medical problems. He does not take any medications or over-the-counter medications. He has been feeling well and has had no sick days and denies any complaints.Patient is comfortable and is able to give Limited history and denies any complaints at this time his mother died of cancer (unknown which one). Other family history is not known.  Bone marrow biopsy on 05/14/2016 showed normal trilineage hematopoiesis with no evidence of lymphoma or leukemia. Peripheral flow cytometry showed reversal of CD4 to CD8 ratio. HIV hepatitis B and hepatitis C testing was negative. B12 and folate were within normal limits. PT/PTT INR was within normal limits.  Review of peripheral smear showed leukopenia and moderate neutropenia. Thrombocytopenia. RBC morphology is normal. Scattered reactive appearing lymphocytes are  seen. No blasts or early forms noted.  Monospot testing was positive. ANA was negative. Quantitative immunoglobulins were within normal limits. Serum copper was within normal limits. CMV DNA PCR negative  Patient was started on empiric high dose steroids for possible autoimmune etiology on 05/28/16. His counts normalized. Then his steroid taper was started. His WBC remained normal but his platelets began to decline 60's.   Plan was to restart full dose steroids and switch to rituxan second line after receiving pre splenectomy vaccines in anticipation of splenectomy should he not respond to rituxan  Patient has received all his pre splenectomy vaccines. He starts rituxan on 10/14/16. Seen at [facility] for second opinion by [name] who agrees with the plan.  [name] and followed by slow steroid taper. Steroids were stopped in September 2018   Interval history-patient is doing well.  He denies any fatigue, unintentional weight loss or drenching night sweats.  His appetite is good and group home staff report no concerns at this time.  Patient has put on 7 pounds as compared to his previous visit  ECOG PS- 0 Pain scale- 0   Review of systems- Review of Systems  Constitutional: Negative for chills, fever, malaise/fatigue and weight loss.  HENT: Negative for congestion, ear discharge and nosebleeds.   Eyes: Negative for blurred vision.  Respiratory: Negative for cough, hemoptysis, sputum production, shortness of breath and wheezing.   Cardiovascular: Negative for chest pain, palpitations, orthopnea and claudication.  Gastrointestinal: Negative for abdominal pain, blood in stool, constipation, diarrhea, heartburn, melena, nausea and vomiting.  Genitourinary: Negative for dysuria, flank pain, frequency, hematuria and urgency.  Musculoskeletal:  Negative for back pain, joint pain and myalgias.  Skin: Negative for rash.  Neurological: Negative for dizziness,  tingling, focal weakness, seizures, weakness and headaches.  Endo/Heme/Allergies: Does not bruise/bleed easily.  Psychiatric/Behavioral: Negative for depression and suicidal ideas. The patient does not have insomnia.        No Known Allergies   Past Medical History:  Diagnosis Date  . Anxiety   . Autism   . Autism   . 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  Social History   Socioeconomic History  . Marital status: Single    Spouse name: Not on file  . Number of children: Not on file  . Years of education: Not on file  . Highest education level: Not on file  Social Needs  . Financial resource strain: Not on file  . Food insecurity - worry: Not on file  . Food insecurity - inability: Not on file  . Transportation needs - medical: Not on file  . Transportation needs - non-medical: Not on file  Occupational History  . Not on file  Tobacco Use  . Smoking status: Never Smoker  . Smokeless tobacco: Never Used  Substance and Sexual Activity  . Alcohol use: No  . Drug use: No  . Sexual activity: No  Other Topics Concern  . Not on file  Social History Narrative  . Not on file    Family History  Problem Relation Age of Onset  . Cancer Mother     No current outpatient medications on file.  Physical exam:  Vitals:   04/01/17 0951  BP: 124/85  Pulse: 64  Resp: 18  Temp: 97.6 F (36.4 C)  TempSrc: Tympanic  Weight: 192 lb 11.2 oz (87.4 kg)   Physical Exam  Constitutional: He is oriented to person, place, and time and well-developed, well-nourished, and in no distress.  HENT:  Head: Normocephalic and atraumatic.  Mouth/Throat: Oropharynx is clear and moist.  Eyes: EOM are normal. Pupils are equal, round, and reactive to light.  Neck: Normal range of motion.  Cardiovascular: Normal rate, regular rhythm and normal heart sounds.  Pulmonary/Chest: Effort normal and breath sounds normal.  Abdominal: Soft. Bowel sounds are normal.    No palpable splenomegaly  Lymphadenopathy:  No palpable cervical, axillary or inguinal adenopathy  Neurological: He is alert and oriented to person, place, and time.  Skin: Skin is warm and dry.     CMP Latest Ref Rng & Units 01/19/2017  Glucose 65 - 99 mg/dL 78  BUN 6 - 20 mg/dL 13  Creatinine 0.61 - 1.24 mg/dL 0.65  Sodium 135 - 145 mmol/L 138  Potassium 3.5 - 5.1 mmol/L 4.0  Chloride 101 - 111 mmol/L 102  CO2 22 - 32 mmol/L 30  Calcium 8.9 - 10.3 mg/dL 9.4  Total Protein 6.5 - 8.1 g/dL 7.3  Total Bilirubin 0.3 - 1.2 mg/dL 0.9  Alkaline Phos 38 - 126 U/L 67  AST 15 - 41 U/L 22  ALT 17 - 63 U/L 20   CBC Latest Ref Rng & Units 04/01/2017  WBC 3.8 - 10.6 K/uL 3.9  Hemoglobin 13.0 - 18.0 g/dL 15.2  Hematocrit 40.0 - 52.0 % 45.5  Platelets 150 - 440 K/uL 145(L)    Assessment and plan- Patient is a 27 y.o. 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  His white  count is normal today with a normal differential.  Mild thrombocytopenia which we will continue to monitor.  I will repeat his CBC with differential in 3 months in 6 months and I will see him back in 6 months.  If he should get his flu shot at this time.  He is already received vaccinations in anticipation of splenectomy which would be the next option should his counts started dropping again after Rituxan.  A second trial of Rituxan could also be considered at that time prior to splenectomy   Visit Diagnosis 1. 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, MD, MPH [facility] at [facility] Pager- [phone] 04/01/2017 10:47 AM

## 2017-04-11 DIAGNOSIS — D693 Immune thrombocytopenic purpura: Secondary | ICD-10-CM | POA: Diagnosis not present

## 2017-04-11 DIAGNOSIS — D709 Neutropenia, unspecified: Secondary | ICD-10-CM | POA: Diagnosis not present

## 2017-04-11 DIAGNOSIS — Z809 Family history of malignant neoplasm, unspecified: Secondary | ICD-10-CM | POA: Diagnosis not present

## 2017-04-11 DIAGNOSIS — D696 Thrombocytopenia, unspecified: Secondary | ICD-10-CM | POA: Diagnosis not present

## 2017-04-11 DIAGNOSIS — F840 Autistic disorder: Secondary | ICD-10-CM | POA: Diagnosis not present

## 2017-04-11 DIAGNOSIS — Z79899 Other long term (current) drug therapy: Secondary | ICD-10-CM | POA: Diagnosis not present

## 2017-04-11 DIAGNOSIS — Z7952 Long term (current) use of systemic steroids: Secondary | ICD-10-CM | POA: Diagnosis not present

## 2017-06-29 ENCOUNTER — Inpatient Hospital Stay: Payer: Medicare Other | Attending: Oncology

## 2017-06-29 ENCOUNTER — Inpatient Hospital Stay: Payer: Medicare Other

## 2017-06-29 ENCOUNTER — Other Ambulatory Visit: Payer: Self-pay

## 2017-06-29 DIAGNOSIS — D61818 Other pancytopenia: Secondary | ICD-10-CM | POA: Diagnosis not present

## 2017-06-29 LAB — COMPREHENSIVE METABOLIC PANEL
ALT: 16 U/L — AB (ref 17–63)
AST: 18 U/L (ref 15–41)
Albumin: 4.6 g/dL (ref 3.5–5.0)
Alkaline Phosphatase: 75 U/L (ref 38–126)
Anion gap: 8 (ref 5–15)
BUN: 12 mg/dL (ref 6–20)
CALCIUM: 9.5 mg/dL (ref 8.9–10.3)
CO2: 27 mmol/L (ref 22–32)
CREATININE: 0.66 mg/dL (ref 0.61–1.24)
Chloride: 101 mmol/L (ref 101–111)
Glucose, Bld: 112 mg/dL — ABNORMAL HIGH (ref 65–99)
Potassium: 4.2 mmol/L (ref 3.5–5.1)
Sodium: 136 mmol/L (ref 135–145)
TOTAL PROTEIN: 7.5 g/dL (ref 6.5–8.1)
Total Bilirubin: 1 mg/dL (ref 0.3–1.2)

## 2017-06-29 LAB — CBC WITH DIFFERENTIAL/PLATELET
BASOS PCT: 1 %
Basophils Absolute: 0 10*3/uL (ref 0–0.1)
EOS ABS: 0.1 10*3/uL (ref 0–0.7)
Eosinophils Relative: 3 %
HCT: 44.4 % (ref 40.0–52.0)
HEMOGLOBIN: 15.7 g/dL (ref 13.0–18.0)
Lymphocytes Relative: 37 %
Lymphs Abs: 1.1 10*3/uL (ref 1.0–3.6)
MCH: 29.4 pg (ref 26.0–34.0)
MCHC: 35.4 g/dL (ref 32.0–36.0)
MCV: 82.9 fL (ref 80.0–100.0)
Monocytes Absolute: 0.3 10*3/uL (ref 0.2–1.0)
Monocytes Relative: 9 %
NEUTROS PCT: 50 %
Neutro Abs: 1.4 10*3/uL (ref 1.4–6.5)
Platelets: 146 10*3/uL — ABNORMAL LOW (ref 150–440)
RBC: 5.35 MIL/uL (ref 4.40–5.90)
RDW: 12.9 % (ref 11.5–14.5)
WBC: 2.9 10*3/uL — ABNORMAL LOW (ref 3.8–10.6)

## 2017-06-30 ENCOUNTER — Other Ambulatory Visit: Payer: Medicare Other

## 2017-07-05 ENCOUNTER — Telehealth: Payer: Self-pay | Admitting: *Deleted

## 2017-07-05 DIAGNOSIS — D61818 Other pancytopenia: Secondary | ICD-10-CM

## 2017-07-05 NOTE — Telephone Encounter (Signed)
-----   Message from Creig HinesArchana C Rao, MD sent at 07/01/2017  7:59 AM EDT ----- Need to repeat cbc with diff in 1 month again. Thanks, Ovidio KinArchana

## 2017-07-05 NOTE — Telephone Encounter (Signed)
Called Bobby Dean and told her that the last cbc was showing wbc-2.9 and it is decrease from last month.  He will need to get recheck another cbc in 1 month.  Toniann FailWendy will bring pt on 5/14 at 9 am for lab only

## 2017-07-26 ENCOUNTER — Inpatient Hospital Stay: Payer: Medicare Other | Attending: Oncology

## 2017-07-26 ENCOUNTER — Other Ambulatory Visit: Payer: Self-pay

## 2017-07-26 DIAGNOSIS — D61818 Other pancytopenia: Secondary | ICD-10-CM

## 2017-07-26 LAB — CBC WITH DIFFERENTIAL/PLATELET
BASOS ABS: 0 10*3/uL (ref 0–0.1)
BASOS PCT: 1 %
Eosinophils Absolute: 0.1 10*3/uL (ref 0–0.7)
Eosinophils Relative: 1 %
HEMATOCRIT: 42.4 % (ref 40.0–52.0)
HEMOGLOBIN: 15.1 g/dL (ref 13.0–18.0)
LYMPHS PCT: 30 %
Lymphs Abs: 1.2 10*3/uL (ref 1.0–3.6)
MCH: 29.7 pg (ref 26.0–34.0)
MCHC: 35.5 g/dL (ref 32.0–36.0)
MCV: 83.8 fL (ref 80.0–100.0)
Monocytes Absolute: 0.3 10*3/uL (ref 0.2–1.0)
Monocytes Relative: 9 %
NEUTROS ABS: 2.3 10*3/uL (ref 1.4–6.5)
NEUTROS PCT: 59 %
Platelets: 165 10*3/uL (ref 150–440)
RBC: 5.06 MIL/uL (ref 4.40–5.90)
RDW: 13.2 % (ref 11.5–14.5)
WBC: 3.9 10*3/uL (ref 3.8–10.6)

## 2017-08-27 IMAGING — US US ABDOMEN COMPLETE
1 series · 14 of 25 positions shown · non-contrast
Comparison: None in PACs

CLINICAL DATA: Pancytopenia and

EXAM:
ABDOMEN ULTRASOUND COMPLETE

[Series 1: us abdomen complete · 0.22mm/px · 14 of 106 slices shown]
[im 1/106]
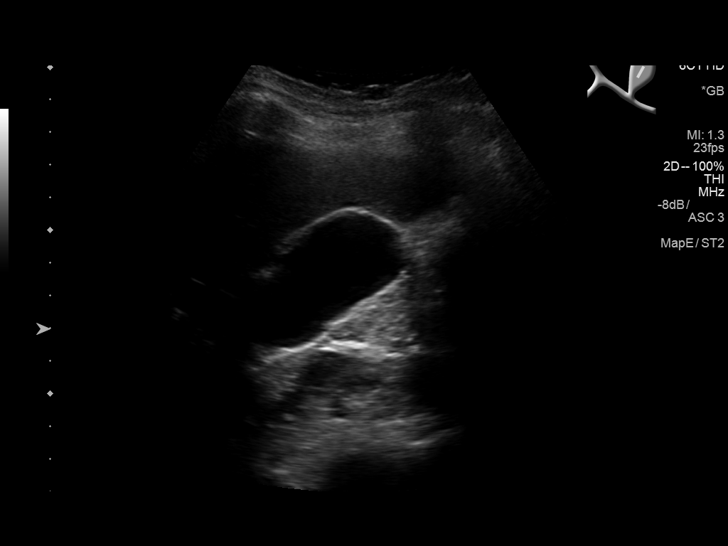
[im 9/106]
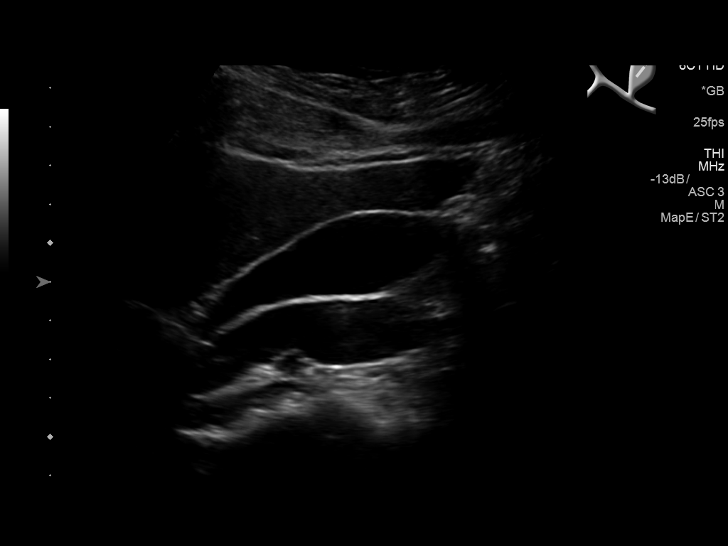
[im 18/106]
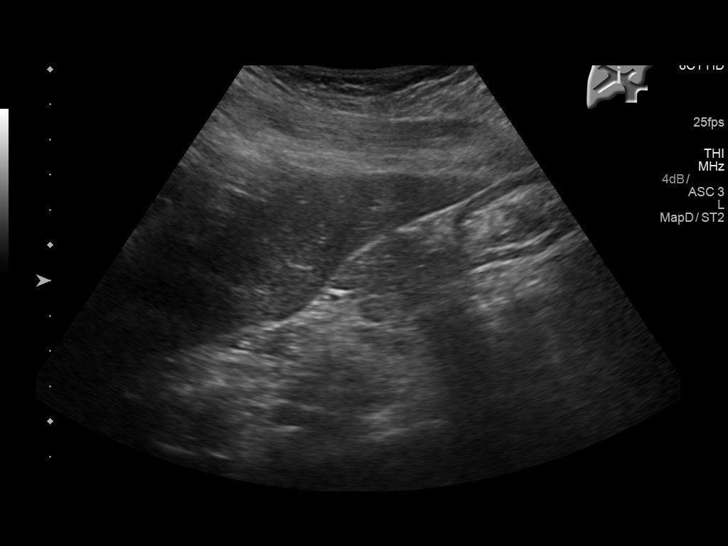
[im 27/106]
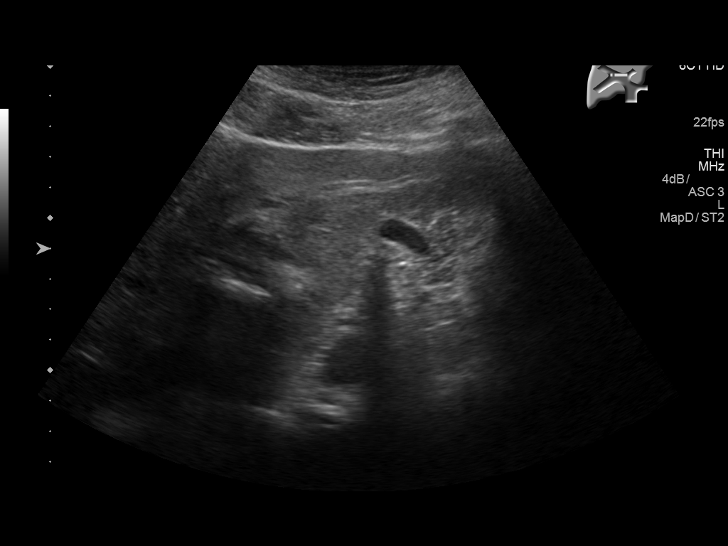
[im 36/106]
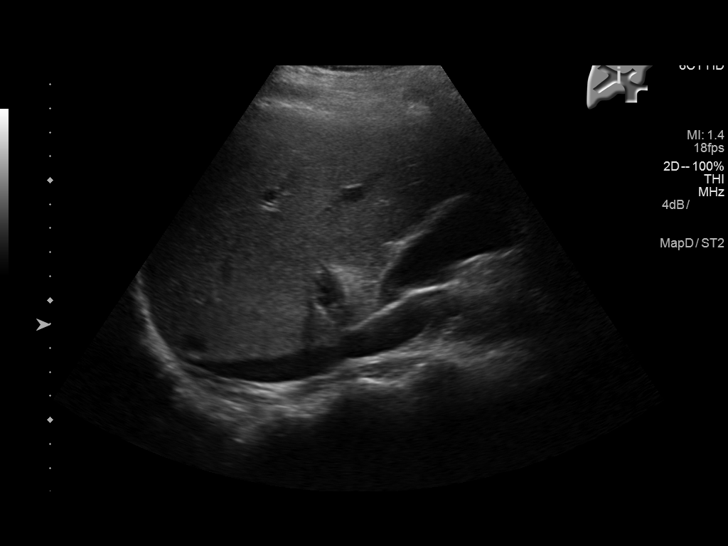
[im 40/106]
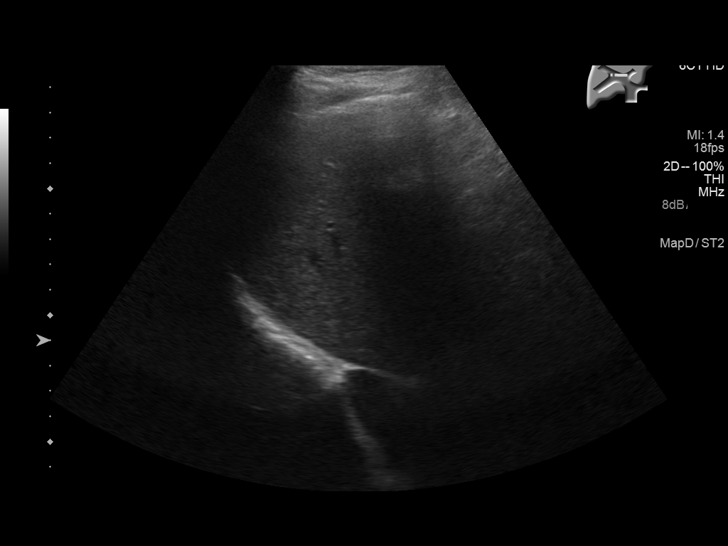
[im 49/106]
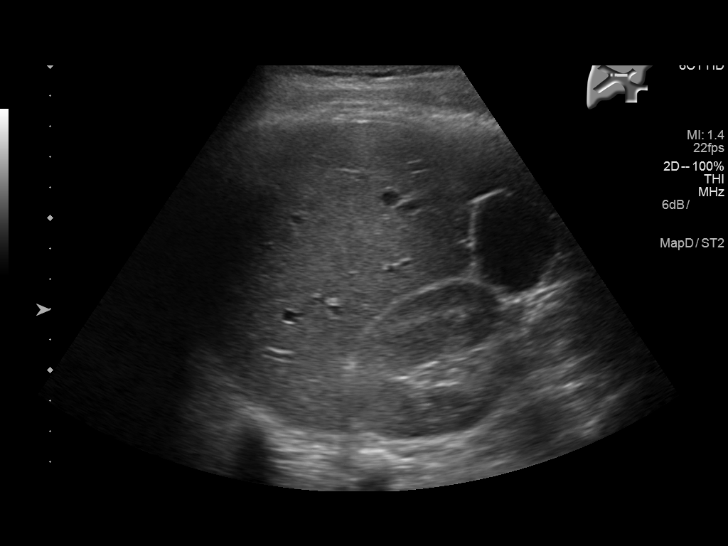
[im 57/106]
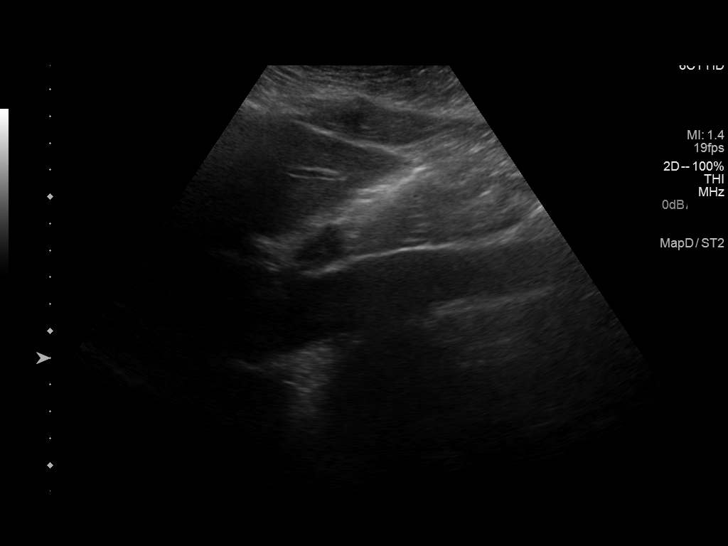
[im 66/106]
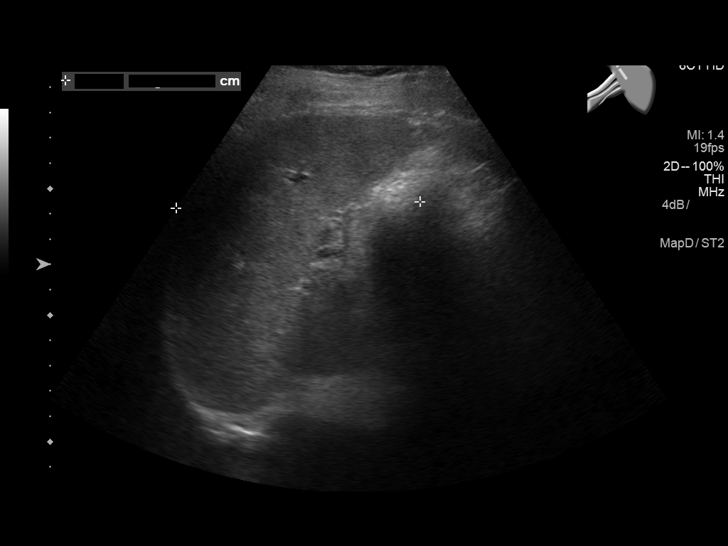
[im 71/106]
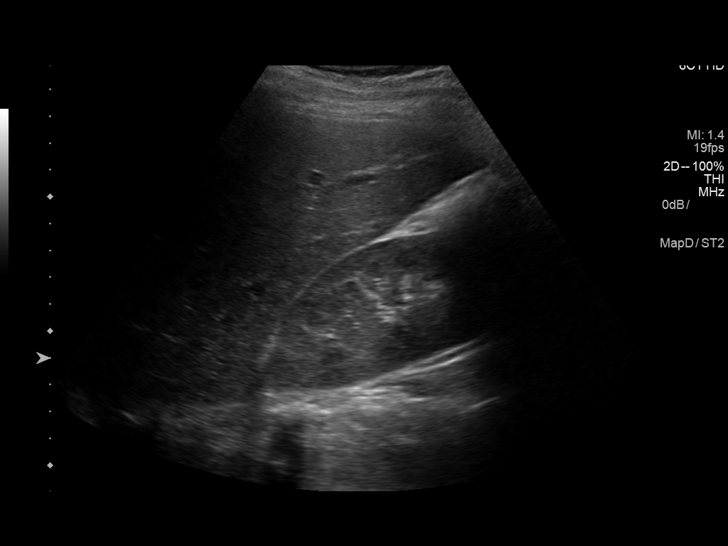
[im 79/106]
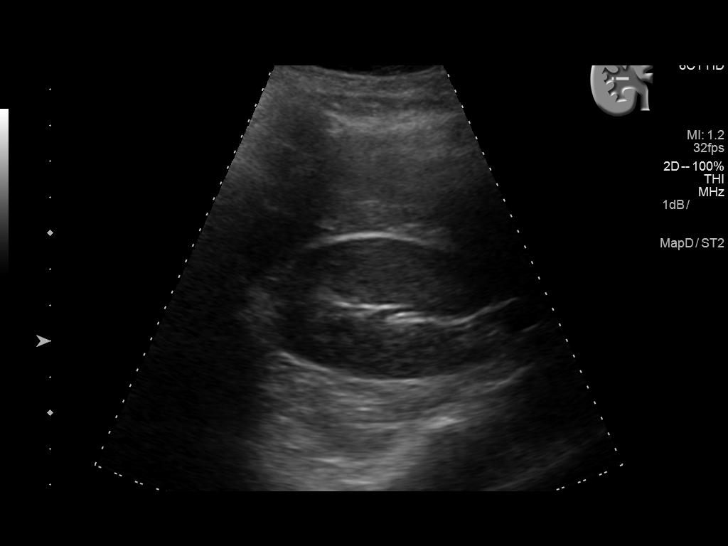
[im 88/106]
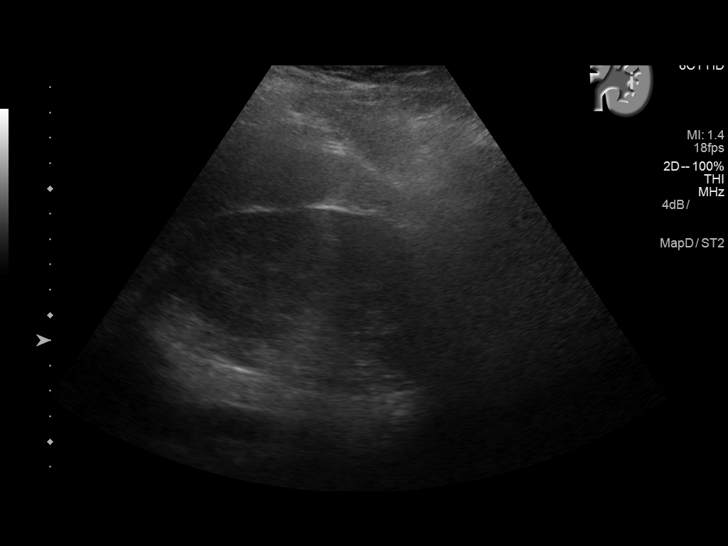
[im 97/106]
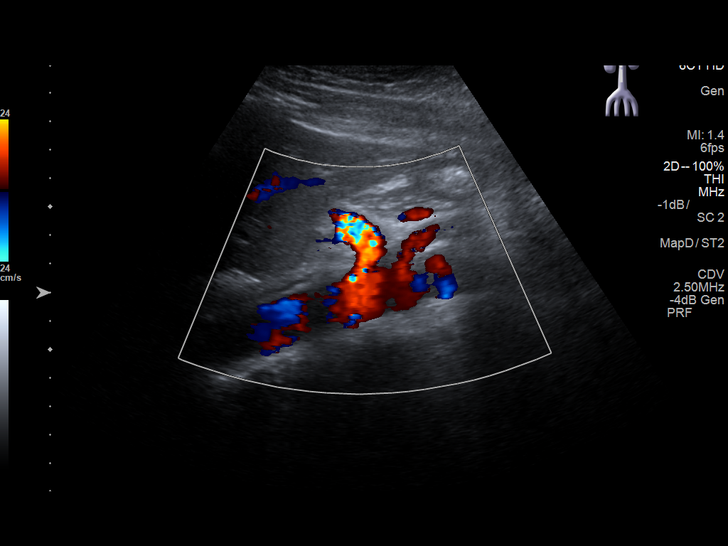
[im 106/106]
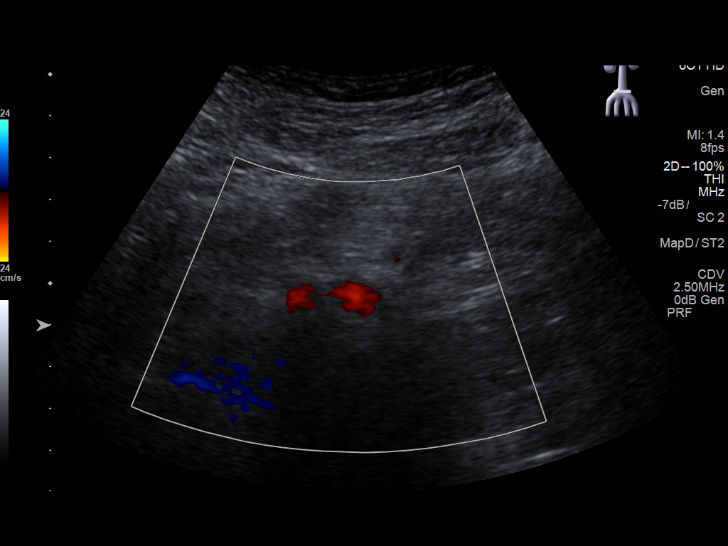

[14 of 25 positions shown; findings below may reference images not displayed]

FINDINGS: Gallbladder: No gallstones or wall thickening visualized. No
sonographic Murphy sign noted by sonographer.

Common bile duct: Diameter: 2 mm

Liver: The hepatic echotexture is normal the surface contour of the
liver is normal. In the right lobe there is a 6 x 6 x 7 mm
hyperechoic focus. There is no intrahepatic ductal dilation.

IVC: No abnormality visualized.

Pancreas: Visualized portion unremarkable.

Spleen: Size and appearance within normal limits.

Right Kidney: Length: 12.4 cm. Echogenicity within normal limits. No
mass or hydronephrosis visualized.

Left Kidney: Length: 12.1 cm. Echogenicity within normal limits. No
mass or hydronephrosis visualized.

Abdominal aorta: No aneurysm visualized.

Other findings: There is no ascites.
IMPRESSION: There is no splenomegaly nor other acute intra-abdominal abnormality
observed. A 7mm hyperechoic focus in the right hepatic lobe is most
compatible with a hemangioma.

## 2017-09-29 ENCOUNTER — Encounter: Payer: Self-pay | Admitting: Oncology

## 2017-09-29 ENCOUNTER — Inpatient Hospital Stay (HOSPITAL_BASED_OUTPATIENT_CLINIC_OR_DEPARTMENT_OTHER): Payer: Medicare Other | Admitting: Oncology

## 2017-09-29 ENCOUNTER — Other Ambulatory Visit: Payer: Self-pay

## 2017-09-29 ENCOUNTER — Inpatient Hospital Stay: Payer: Medicare Other | Attending: Oncology

## 2017-09-29 VITALS — BP 150/90 | HR 68 | Temp 94.9°F | Resp 18 | Wt 190.5 lb

## 2017-09-29 DIAGNOSIS — D61818 Other pancytopenia: Secondary | ICD-10-CM | POA: Diagnosis not present

## 2017-09-29 DIAGNOSIS — F840 Autistic disorder: Secondary | ICD-10-CM

## 2017-09-29 DIAGNOSIS — Z9221 Personal history of antineoplastic chemotherapy: Secondary | ICD-10-CM

## 2017-09-29 LAB — CBC WITH DIFFERENTIAL/PLATELET
BASOS PCT: 1 %
Basophils Absolute: 0 10*3/uL (ref 0–0.1)
Eosinophils Absolute: 0.1 10*3/uL (ref 0–0.7)
Eosinophils Relative: 2 %
HEMATOCRIT: 45.7 % (ref 40.0–52.0)
HEMOGLOBIN: 15.5 g/dL (ref 13.0–18.0)
LYMPHS ABS: 1.4 10*3/uL (ref 1.0–3.6)
Lymphocytes Relative: 38 %
MCH: 28.8 pg (ref 26.0–34.0)
MCHC: 33.9 g/dL (ref 32.0–36.0)
MCV: 84.8 fL (ref 80.0–100.0)
MONOS PCT: 10 %
Monocytes Absolute: 0.4 10*3/uL (ref 0.2–1.0)
NEUTROS ABS: 1.8 10*3/uL (ref 1.4–6.5)
NEUTROS PCT: 49 %
Platelets: 131 10*3/uL — ABNORMAL LOW (ref 150–440)
RBC: 5.39 MIL/uL (ref 4.40–5.90)
RDW: 13 % (ref 11.5–14.5)
WBC: 3.7 10*3/uL — ABNORMAL LOW (ref 3.8–10.6)

## 2017-09-29 LAB — COMPREHENSIVE METABOLIC PANEL
ALBUMIN: 4.4 g/dL (ref 3.5–5.0)
ALT: 15 U/L (ref 0–44)
ANION GAP: 10 (ref 5–15)
AST: 22 U/L (ref 15–41)
Alkaline Phosphatase: 84 U/L (ref 38–126)
BUN: 10 mg/dL (ref 6–20)
CALCIUM: 9.3 mg/dL (ref 8.9–10.3)
CO2: 25 mmol/L (ref 22–32)
Chloride: 105 mmol/L (ref 98–111)
Creatinine, Ser: 0.65 mg/dL (ref 0.61–1.24)
GFR calc Af Amer: >60 mL/min (ref >60)
GFR calc non Af Amer: >60 mL/min (ref >60)
GLUCOSE: 85 mg/dL (ref 70–99)
Potassium: 3.3 mmol/L — ABNORMAL LOW (ref 3.5–5.1)
Sodium: 140 mmol/L (ref 135–145)
Total Bilirubin: 0.7 mg/dL (ref 0.3–1.2)
Total Protein: 7.1 g/dL (ref 6.5–8.1)

## 2017-09-29 NOTE — Progress Notes (Signed)
Here for follow up. " I feel good " "energy is good" he stated w enthusiasm  . Here w group home staff member.

## 2017-09-29 NOTE — Progress Notes (Signed)
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 He has a history of autism and intellectual visibility and currently lives in a group home for the last 7 months. His medical decision maker is [name] from [place] office.   Blood work from 04/28/2016 showed white count of 2.1 with an H&H of 15.5/45.1 and a platelet count of 51. CMP was within normal limits. TSH was normal at 1.09. At this time I do not have any prior CBCs for comparison.  Patient is here with his group home Asst. [name] today who reports that patient has been doing well over the last 7 months and has no known medical problems. He does not take any medications or over-the-counter medications. He has been feeling well and has had no sick days and denies any complaints.Patient is comfortable and is able to give Limited history and denies any complaints at this time his mother died of cancer (unknown which one). Other family history is not known.  Bone marrow biopsy on 05/14/2016 showed normal trilineage hematopoiesis with no evidence of lymphoma or leukemia. Peripheral flow cytometry showed reversal of CD4 to CD8 ratio. HIV hepatitis B and hepatitis C testing was negative. B12 and folate were within normal limits. PT/PTT INR was within normal limits.  Review of peripheral smear showed leukopenia and moderate neutropenia. Thrombocytopenia. RBC morphology is normal. Scattered reactive appearing lymphocytes are  seen. No blasts or early forms noted.  Monospot testing was positive. ANA was negative. Quantitative immunoglobulins were within normal limits. Serum copper was within normal limits. CMV DNA PCR negative  Patient was started on empiric high dose steroids for possible autoimmune etiology on 05/28/16. His counts normalized. Then his steroid taper was started. His WBC remained normal but his platelets began to decline 60's.   Plan was to restart full dose steroids and switch to rituxan second line after receiving pre splenectomy vaccines in anticipation of splenectomy should he not respond to rituxan  Patient has received all his pre splenectomy vaccines. He starts rituxan on 10/14/16. Seen at [facility] for second opinion by [name] who agrees with the plan.  [name] andfollowed byslow steroid taper. Steroids were stopped in September 2018   Interval history-he is doing well.  Appetite is good and he denies any fatigue, unintentional weight loss or night sweats.  ECOG PS- 0 Pain scale- 0   Review of systems- Review of Systems  Constitutional: Negative for chills, fever, malaise/fatigue and weight loss.  HENT: Negative for congestion, ear discharge and nosebleeds.   Eyes: Negative for blurred vision.  Respiratory: Negative for cough, hemoptysis, sputum production, shortness of breath and wheezing.   Cardiovascular: Negative for chest pain, palpitations, orthopnea and claudication.  Gastrointestinal: Negative for abdominal pain, blood in stool, constipation, diarrhea, heartburn, melena, nausea and vomiting.  Genitourinary: Negative for dysuria, flank pain, frequency, hematuria and urgency.  Musculoskeletal: Negative for back pain, joint pain and myalgias.  Skin: Negative for rash.  Neurological: Negative for dizziness, tingling, focal weakness, seizures, weakness and headaches.  Endo/Heme/Allergies: Does not bruise/bleed easily.    Psychiatric/Behavioral: Negative for depression and suicidal ideas. The patient does not have insomnia.       No Known Allergies   Past Medical History:  Diagnosis Date  . Anxiety   . Autism   . Autism   . 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  Social History   Socioeconomic History  . Marital status: Single    Spouse name: Not on file  . Number of children: Not on file  . Years of education: Not on file  . Highest education level: Not on file  Occupational History  . Not on file  Social Needs  . Financial resource strain: Not on file  . Food insecurity:    Worry: Not on file    Inability: Not on file  . Transportation needs:    Medical: Not on file    Non-medical: Not on file  Tobacco Use  . Smoking status: Never Smoker  . Smokeless tobacco: Never Used  Substance and Sexual Activity  . Alcohol use: No  . Drug use: No  . Sexual activity: Never  Lifestyle  . Physical activity:    Days per week: Not on file    Minutes per session: Not on file  . Stress: Not on file  Relationships  . Social connections:    Talks on phone: Not on file    Gets together: Not on file    Attends religious service: Not on file    Active member of club or organization: Not on file    Attends meetings of clubs or organizations: Not on file    Relationship status: Not on file  . Intimate partner violence:    Fear of current or ex partner: Not on file    Emotionally abused: Not on file    Physically abused: Not on file    Forced sexual activity: Not on file  Other Topics Concern  . Not on file  Social History Narrative  . Not on file    Family History  Problem Relation Age of Onset  . Cancer Mother     No current outpatient medications on file.  Physical exam: There were no vitals filed for this visit. Physical Exam  Constitutional: He is oriented to person, place, and time. He appears well-developed and well-nourished.  HENT:  Head:  Normocephalic and atraumatic.  Mouth/Throat: Oropharynx is clear and moist.  Eyes: Pupils are equal, round, and reactive to light. EOM are normal.  Neck: Normal range of motion.  Cardiovascular: Normal rate, regular rhythm and normal heart sounds.  Pulmonary/Chest: Effort normal and breath sounds normal.  Abdominal: Soft. Bowel sounds are normal.  No palpable hepatosplenomegaly  Lymphadenopathy:  No palpable cervical, supraclavicular, axillary or inguinal adenopathy   Neurological: He is alert and oriented to person, place, and time.  Skin: Skin is warm and dry.     CMP Latest Ref Rng & Units 09/29/2017  Glucose 70 - 99 mg/dL 85  BUN 6 - 20 mg/dL 10  Creatinine 0.61 - 1.24 mg/dL 0.65  Sodium 135 - 145 mmol/L 140  Potassium 3.5 - 5.1 mmol/L 3.3(L)  Chloride 98 - 111 mmol/L 105  CO2 22 - 32 mmol/L 25  Calcium 8.9 - 10.3 mg/dL 9.3  Total Protein 6.5 - 8.1 g/dL 7.1  Total Bilirubin 0.3 - 1.2 mg/dL 0.7  Alkaline Phos 38 - 126 U/L 84  AST 15 - 41 U/L 22  ALT 0 - 44 U/L 15   CBC Latest Ref Rng & Units 09/29/2017  WBC 3.8 - 10.6 K/uL 3.7(L)  Hemoglobin 13.0 - 18.0 g/dL 15.5  Hematocrit 40.0 - 52.0 % 45.7  Platelets 150 - 440 K/uL 131(L)     Assessment and plan- Patient is a 26 y.o. 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  His white count is now stable between 2.9- 5.  Platelet counts have been fluctuating between 120s to 160s.  Last dose of Rituxan was in August 2018.  This can be monitored conservatively and we will repeat his CBC with differential in 2 months, 4 months and 6 months and I will see him back in 6 months.   Visit Diagnosis 1. Autoimmune pancytopenia (HCC)      Dr. Archana Rao, MD, MPH [facility] [phone] 09/29/2017 10:35 AM                 

## 2017-11-09 ENCOUNTER — Other Ambulatory Visit: Payer: Self-pay | Admitting: Nurse Practitioner

## 2017-11-30 ENCOUNTER — Inpatient Hospital Stay: Payer: Medicare Other | Attending: Oncology

## 2017-11-30 ENCOUNTER — Telehealth: Payer: Self-pay | Admitting: *Deleted

## 2017-11-30 DIAGNOSIS — D61818 Other pancytopenia: Secondary | ICD-10-CM | POA: Insufficient documentation

## 2017-11-30 LAB — COMPREHENSIVE METABOLIC PANEL
ALK PHOS: 73 U/L (ref 38–126)
ALT: 13 U/L (ref 0–44)
AST: 18 U/L (ref 15–41)
Albumin: 4.8 g/dL (ref 3.5–5.0)
Anion gap: 8 (ref 5–15)
BUN: 16 mg/dL (ref 6–20)
CALCIUM: 9.6 mg/dL (ref 8.9–10.3)
CO2: 28 mmol/L (ref 22–32)
CREATININE: 0.67 mg/dL (ref 0.61–1.24)
Chloride: 104 mmol/L (ref 98–111)
GFR calc non Af Amer: >60 mL/min (ref >60)
GLUCOSE: 90 mg/dL (ref 70–99)
Potassium: 3.6 mmol/L (ref 3.5–5.1)
SODIUM: 140 mmol/L (ref 135–145)
Total Bilirubin: 0.8 mg/dL (ref 0.3–1.2)
Total Protein: 7.6 g/dL (ref 6.5–8.1)

## 2017-11-30 LAB — CBC WITH DIFFERENTIAL/PLATELET
BASOS PCT: 1 %
Basophils Absolute: 0 10*3/uL (ref 0–0.1)
Eosinophils Absolute: 0.1 10*3/uL (ref 0–0.7)
Eosinophils Relative: 2 %
HEMATOCRIT: 43.4 % (ref 40.0–52.0)
HEMOGLOBIN: 15.1 g/dL (ref 13.0–18.0)
Lymphocytes Relative: 43 %
Lymphs Abs: 1.1 10*3/uL (ref 1.0–3.6)
MCH: 29.1 pg (ref 26.0–34.0)
MCHC: 34.9 g/dL (ref 32.0–36.0)
MCV: 83.4 fL (ref 80.0–100.0)
MONOS PCT: 14 %
Monocytes Absolute: 0.4 10*3/uL (ref 0.2–1.0)
NEUTROS ABS: 1 10*3/uL — AB (ref 1.4–6.5)
NEUTROS PCT: 40 %
Platelets: 53 10*3/uL — ABNORMAL LOW (ref 150–440)
RBC: 5.2 MIL/uL (ref 4.40–5.90)
RDW: 13.9 % (ref 11.5–14.5)
WBC: 2.5 10*3/uL — ABNORMAL LOW (ref 3.8–10.6)

## 2017-11-30 NOTE — Telephone Encounter (Signed)
Called and spoke to [place] about starting patient back on rituxan. His blood work indicates that wbc, and plt going back down and she does want to start back the rituxan. I wanted to know that if we need permission again. Nelma RothmanDaryl gives me permission to give the rituxan but will need to talk with Toniann FailWendy at the care home. I told him that we will contact Toniann FailWendy.

## 2017-12-01 ENCOUNTER — Telehealth: Payer: Self-pay | Admitting: *Deleted

## 2017-12-01 NOTE — Telephone Encounter (Signed)
Called wendy and spoke to her about pt's results and that his wbc, and plt dropping and she would like to start him back on rituxan since it has worked in the past. I gave her the date for next week and she can't do it. [facility] and there is no space other than the Thursday already given. [facility] and it will take 5 hours. I spoke with Dr. Smith Robertao and she said start him the following week. It will ok. I called wendy back and let her know the dates and times and will print and mail it also

## 2017-12-08 ENCOUNTER — Other Ambulatory Visit: Payer: Medicare Other

## 2017-12-08 ENCOUNTER — Ambulatory Visit: Payer: Medicare Other | Admitting: Oncology

## 2017-12-08 ENCOUNTER — Ambulatory Visit: Payer: Medicare Other

## 2017-12-13 ENCOUNTER — Other Ambulatory Visit: Payer: Self-pay | Admitting: Oncology

## 2017-12-15 ENCOUNTER — Inpatient Hospital Stay: Payer: Medicare Other | Attending: Oncology | Admitting: Oncology

## 2017-12-15 ENCOUNTER — Inpatient Hospital Stay: Payer: Medicare Other

## 2017-12-15 ENCOUNTER — Encounter: Payer: Self-pay | Admitting: Oncology

## 2017-12-15 ENCOUNTER — Telehealth: Payer: Self-pay | Admitting: *Deleted

## 2017-12-15 VITALS — BP 129/81 | HR 67 | Temp 97.7°F | Resp 18 | Ht 72.0 in | Wt 185.3 lb

## 2017-12-15 VITALS — BP 126/83

## 2017-12-15 DIAGNOSIS — F840 Autistic disorder: Secondary | ICD-10-CM | POA: Diagnosis not present

## 2017-12-15 DIAGNOSIS — D61818 Other pancytopenia: Secondary | ICD-10-CM

## 2017-12-15 DIAGNOSIS — Z5112 Encounter for antineoplastic immunotherapy: Secondary | ICD-10-CM | POA: Insufficient documentation

## 2017-12-15 DIAGNOSIS — D8989 Other specified disorders involving the immune mechanism, not elsewhere classified: Secondary | ICD-10-CM | POA: Insufficient documentation

## 2017-12-15 DIAGNOSIS — Z79620 Long term (current) use of immunosuppressive biologic: Secondary | ICD-10-CM

## 2017-12-15 DIAGNOSIS — Z5181 Encounter for therapeutic drug level monitoring: Secondary | ICD-10-CM

## 2017-12-15 DIAGNOSIS — Z79899 Other long term (current) drug therapy: Secondary | ICD-10-CM

## 2017-12-15 LAB — CBC WITH DIFFERENTIAL/PLATELET
BASOS PCT: 1 %
Basophils Absolute: 0 10*3/uL (ref 0–0.1)
Eosinophils Absolute: 0.1 10*3/uL (ref 0–0.7)
Eosinophils Relative: 3 %
HEMATOCRIT: 43.8 % (ref 40.0–52.0)
Hemoglobin: 15.2 g/dL (ref 13.0–18.0)
Lymphocytes Relative: 50 %
Lymphs Abs: 0.9 10*3/uL — ABNORMAL LOW (ref 1.0–3.6)
MCH: 29 pg (ref 26.0–34.0)
MCHC: 34.6 g/dL (ref 32.0–36.0)
MCV: 83.8 fL (ref 80.0–100.0)
Monocytes Absolute: 0.3 10*3/uL (ref 0.2–1.0)
Monocytes Relative: 14 %
NEUTROS ABS: 0.6 10*3/uL — AB (ref 1.4–6.5)
NEUTROS PCT: 32 %
PLATELETS: 57 10*3/uL — AB (ref 150–440)
RBC: 5.23 MIL/uL (ref 4.40–5.90)
RDW: 13.3 % (ref 11.5–14.5)
WBC: 1.9 10*3/uL — ABNORMAL LOW (ref 3.8–10.6)

## 2017-12-15 MED ORDER — SODIUM CHLORIDE 0.9 % IV SOLN
Freq: Once | INTRAVENOUS | Status: AC
  Administered 2017-12-15: 11:00:00 via INTRAVENOUS
  Filled 2017-12-15: qty 250

## 2017-12-15 MED ORDER — ACETAMINOPHEN 325 MG PO TABS
650.0000 mg | ORAL_TABLET | Freq: Once | ORAL | Status: AC
  Administered 2017-12-15: 650 mg via ORAL
  Filled 2017-12-15: qty 2

## 2017-12-15 MED ORDER — DIPHENHYDRAMINE HCL 25 MG PO CAPS
50.0000 mg | ORAL_CAPSULE | Freq: Once | ORAL | Status: AC
  Administered 2017-12-15: 50 mg via ORAL
  Filled 2017-12-15: qty 2

## 2017-12-15 MED ORDER — PREDNISONE 20 MG PO TABS: 60.0000 mg | ORAL_TABLET | Freq: Every day | ORAL | 0 refills | Status: DC

## 2017-12-15 MED ORDER — SODIUM CHLORIDE 0.9 % IV SOLN
375.0000 mg/m2 | Freq: Once | INTRAVENOUS | Status: AC
  Administered 2017-12-15: 800 mg via INTRAVENOUS
  Filled 2017-12-15: qty 50

## 2017-12-15 NOTE — Telephone Encounter (Signed)
Called Bobby Dean DSS guardian  To get permission over the phone for pt to get rituxan again. I previously called him in sept. And he gave me verbal permission and per Selena Batten in chemo we have to have it written down. She advised me to call Misty Stanley for clarification and as long as you get verbal permission on the phone with 2 staff members that is what you document on paper.  I spoke to [facility] and he gave permission again and Toni Amend got on telephone and he gave permission to her also. Form filled out and written on form with my and courtney signature for consent.

## 2017-12-15 NOTE — Progress Notes (Signed)
No new changes noted today 

## 2017-12-16 NOTE — Progress Notes (Signed)
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 He has a history of autism and intellectual visibility and currently lives in a group home for the last 7 months. His medical decision maker is [name] from [place] office.   Blood work from 04/28/2016 showed white count of 2.1 with an H&H of 15.5/45.1 and a platelet count of 51. CMP was within normal limits. TSH was normal at 1.09. At this time I do not have any prior CBCs for comparison.  Patient is here with his group home Asst. [name] today who reports that patient has been doing well over the last 7 months and has no known medical problems. He does not take any medications or over-the-counter medications. He has been feeling well and has had no sick days and denies any complaints.Patient is comfortable and is able to give Limited history and denies any complaints at this time his mother died of cancer (unknown which one). Other family history is not known.  Bone marrow biopsy on 05/14/2016 showed normal trilineage hematopoiesis with no evidence of lymphoma or leukemia. Peripheral flow cytometry showed reversal of CD4 to CD8 ratio. HIV hepatitis B and hepatitis C testing was negative. B12 and folate were within normal limits. PT/PTT INR was within normal limits.  Review of peripheral smear showed leukopenia and moderate neutropenia. Thrombocytopenia. RBC morphology is normal. Scattered reactive appearing lymphocytes are  seen. No blasts or early forms noted.  Monospot testing was positive. ANA was negative. Quantitative immunoglobulins were within normal limits. Serum copper was within normal limits. CMV DNA PCR negative  Patient was started on empiric high dose steroids for possible autoimmune etiology on 05/28/16. His counts normalized. Then his steroid taper was started. His WBC remained normal but his platelets began to decline 60's.   Plan was to restart full dose steroids and switch to rituxan second line after receiving pre splenectomy vaccines in anticipation of splenectomy should he not respond to rituxan  Patient has received all his pre splenectomy vaccines. He starts rituxan on 10/14/16. Seen at [facility] for second opinion by [name] who agrees with the plan.  [name] andfollowed byslow steroid taper. Steroids were stopped in September 2018  Interval history-patient is doing well and denies any complaints.  He continues to be active and participates in various sports at his group home.  Denies any fatigue fever or recurrent infections.  ECOG PS- 0 Pain scale- 0 Opioid associated constipation- no  Review of systems- Review of Systems  Constitutional: Negative for chills, fever, malaise/fatigue and weight loss.  HENT: Negative for congestion, ear discharge and nosebleeds.   Eyes: Negative for blurred vision.  Respiratory: Negative for cough, hemoptysis, sputum production, shortness of breath and wheezing.   Cardiovascular: Negative for chest pain, palpitations, orthopnea and claudication.  Gastrointestinal: Negative for abdominal pain, blood in stool, constipation, diarrhea, heartburn, melena, nausea and vomiting.  Genitourinary: Negative for dysuria, flank pain, frequency, hematuria and urgency.  Musculoskeletal: Negative for back pain, joint pain and myalgias.  Skin: Negative for rash.  Neurological: Negative for dizziness, tingling, focal weakness,  seizures, weakness and headaches.  Endo/Heme/Allergies: Does not bruise/bleed easily.  Psychiatric/Behavioral: Negative for depression and suicidal ideas. The patient does not have insomnia.       No Known Allergies   Past Medical History:  Diagnosis Date  . Anxiety   . Autism   . Autism   . Autoimmune pancytopenia ([place])      History reviewed. No pertinent surgical history.  Social History   Socioeconomic History  . Marital status: Single    Spouse name: Not on file  . Number of children: Not on file  . Years of education: Not on file  . Highest education level: Not on file  Occupational History  . Not on file  Social Needs  . Financial resource strain: Not on file  . Food insecurity:    Worry: Not on file    Inability: Not on file  . Transportation needs:    Medical: Not on file    Non-medical: Not on file  Tobacco Use  . Smoking status: Never Smoker  . Smokeless tobacco: Never Used  Substance and Sexual Activity  . Alcohol use: No  . Drug use: No  . Sexual activity: Never  Lifestyle  . Physical activity:    Days per week: Not on file    Minutes per session: Not on file  . Stress: Not on file  Relationships  . Social connections:    Talks on phone: Not on file    Gets together: Not on file    Attends religious service: Not on file    Active member of club or organization: Not on file    Attends meetings of clubs or organizations: Not on file    Relationship status: Not on file  . Intimate partner violence:    Fear of current or ex partner: Not on file    Emotionally abused: Not on file    Physically abused: Not on file    Forced sexual activity: Not on file  Other Topics Concern  . Not on file  Social History Narrative  . Not on file    Family History  Problem Relation Age of Onset  . Cancer Mother      Current Outpatient Medications:  .  predniSONE (DELTASONE) 20 MG tablet, Take 3 tablets (60 mg total) by mouth daily with breakfast., Disp:  90 tablet, Rfl: 0  Physical exam:  Vitals:   12/15/17 0914  BP: 129/81  Pulse: 67  Resp: 18  Temp: 97.7 F (36.5 C)  TempSrc: Tympanic  SpO2: 99%  Weight: 185 lb 4.8 oz (84.1 kg)  Height: 6' (1.829 m)   Physical Exam  Constitutional: He is oriented to person, place, and time. He appears well-developed and well-nourished.  HENT:  Head: Normocephalic and atraumatic.  Eyes: Pupils are equal, round, and reactive to light. EOM are normal.  Neck: Normal range of motion.  Cardiovascular: Normal rate, regular rhythm and normal heart sounds.  Pulmonary/Chest: Effort normal and breath sounds normal.  Abdominal: Soft. Bowel sounds are normal.  Neurological: He is alert and oriented to person, place, and time.  Skin: Skin is warm and dry.     CMP Latest Ref Rng & Units 11/30/2017  Glucose 70 - 99 mg/dL 90  BUN 6 - 20 mg/dL 16  Creatinine 0.61 - 1.24 mg/dL 0.67  Sodium 135 - 145 mmol/L 140  Potassium 3.5 - 5.1 mmol/L 3.6  Chloride 98 - 111 mmol/L 104  CO2  22 - 32 mmol/L 28  Calcium 8.9 - 10.3 mg/dL 9.6  Total Protein 6.5 - 8.1 g/dL 7.6  Total Bilirubin 0.3 - 1.2 mg/dL 0.8  Alkaline Phos 38 - 126 U/L 73  AST 15 - 41 U/L 18  ALT 0 - 44 U/L 13   CBC Latest Ref Rng & Units 12/15/2017  WBC 3.8 - 10.6 K/uL 1.9(L)  Hemoglobin 13.0 - 18.0 g/dL 15.2  Hematocrit 40.0 - 52.0 % 43.8  Platelets 150 - 440 K/uL 57(L)     Assessment and plan- Patient is a 27 y.o. male with autoimmune pancytopenia mainly neutropenia and thrombocytopenia status post Rituxan and steroids in August 2018 now with recurrent neutropenia and thrombocytopenia  We have been monitoring his CBC roughly every 2 months and it has been a year since he last received steroids and Rituxan.  Patient's white count is down to 1.9 with an ANC of 0.6 today and a platelet count of 57.  He will therefore proceed with 4 more weekly doses of Rituxan starting today.  We have checked his hepatitis B and C testing in the past and was  negative.  We will also obtain consent through his healthcare proxy at group home.  I will also start him on full dose steroids at 60 mg and he will continue to remain on 60 mg prednisone until he finishes 4 doses of Rituxan following which I will start to taper his steroids slowly.  I have asked him to hold off on participating in sports until his neutropenia recovers.  I will see him back in 1 week's time with CBC and CMP for cycle 2 of weekly Rituxan   Visit Diagnosis 1. Autoimmune pancytopenia ([place])   2. Encounter for monitoring rituximab therapy      [name], MD, MPH [facility] [phone] 12/16/2017 9:49 AM

## 2017-12-22 ENCOUNTER — Encounter: Payer: Self-pay | Admitting: Oncology

## 2017-12-22 ENCOUNTER — Inpatient Hospital Stay: Payer: Medicare Other

## 2017-12-22 ENCOUNTER — Ambulatory Visit: Payer: Medicare Other

## 2017-12-22 ENCOUNTER — Inpatient Hospital Stay (HOSPITAL_BASED_OUTPATIENT_CLINIC_OR_DEPARTMENT_OTHER): Payer: Medicare Other | Admitting: Oncology

## 2017-12-22 ENCOUNTER — Other Ambulatory Visit: Payer: Medicare Other

## 2017-12-22 VITALS — BP 146/96 | HR 83 | Temp 98.1°F | Resp 18 | Ht 72.0 in | Wt 188.0 lb

## 2017-12-22 DIAGNOSIS — D61818 Other pancytopenia: Secondary | ICD-10-CM

## 2017-12-22 DIAGNOSIS — F840 Autistic disorder: Secondary | ICD-10-CM

## 2017-12-22 DIAGNOSIS — Z79899 Other long term (current) drug therapy: Secondary | ICD-10-CM

## 2017-12-22 DIAGNOSIS — Z5181 Encounter for therapeutic drug level monitoring: Secondary | ICD-10-CM

## 2017-12-22 DIAGNOSIS — Z5112 Encounter for antineoplastic immunotherapy: Secondary | ICD-10-CM | POA: Diagnosis not present

## 2017-12-22 DIAGNOSIS — D8989 Other specified disorders involving the immune mechanism, not elsewhere classified: Secondary | ICD-10-CM | POA: Diagnosis not present

## 2017-12-22 LAB — CBC WITH DIFFERENTIAL/PLATELET
Abs Immature Granulocytes: 0.01 10*3/uL (ref 0.00–0.07)
BASOS PCT: 1 %
Basophils Absolute: 0 10*3/uL (ref 0.0–0.1)
EOS ABS: 0 10*3/uL (ref 0.0–0.5)
EOS PCT: 1 %
HCT: 45.5 % (ref 39.0–52.0)
Hemoglobin: 15.3 g/dL (ref 13.0–17.0)
Immature Granulocytes: 1 %
Lymphocytes Relative: 40 %
Lymphs Abs: 0.7 10*3/uL (ref 0.7–4.0)
MCH: 28.2 pg (ref 26.0–34.0)
MCHC: 33.6 g/dL (ref 30.0–36.0)
MCV: 83.9 fL (ref 80.0–100.0)
MONO ABS: 0.3 10*3/uL (ref 0.1–1.0)
Monocytes Relative: 19 %
Neutro Abs: 0.7 10*3/uL — ABNORMAL LOW (ref 1.7–7.7)
Neutrophils Relative %: 38 %
PLATELETS: 47 10*3/uL — AB (ref 150–400)
RBC: 5.42 MIL/uL (ref 4.22–5.81)
RDW: 12.6 % (ref 11.5–15.5)
WBC: 1.8 10*3/uL — ABNORMAL LOW (ref 4.0–10.5)
nRBC: 0 % (ref 0.0–0.2)

## 2017-12-22 LAB — COMPREHENSIVE METABOLIC PANEL
ALK PHOS: 70 U/L (ref 38–126)
ALT: 14 U/L (ref 0–44)
AST: 17 U/L (ref 15–41)
Albumin: 4.4 g/dL (ref 3.5–5.0)
Anion gap: 10 (ref 5–15)
BILIRUBIN TOTAL: 1 mg/dL (ref 0.3–1.2)
BUN: 12 mg/dL (ref 6–20)
CALCIUM: 9.6 mg/dL (ref 8.9–10.3)
CO2: 29 mmol/L (ref 22–32)
CREATININE: 0.78 mg/dL (ref 0.61–1.24)
Chloride: 103 mmol/L (ref 98–111)
GFR calc non Af Amer: >60 mL/min (ref >60)
Glucose, Bld: 92 mg/dL (ref 70–99)
Potassium: 3.3 mmol/L — ABNORMAL LOW (ref 3.5–5.1)
Sodium: 142 mmol/L (ref 135–145)
TOTAL PROTEIN: 7.2 g/dL (ref 6.5–8.1)

## 2017-12-22 MED ORDER — DIPHENHYDRAMINE HCL 25 MG PO CAPS
50.0000 mg | ORAL_CAPSULE | Freq: Once | ORAL | Status: AC
  Administered 2017-12-22: 50 mg via ORAL
  Filled 2017-12-22: qty 2

## 2017-12-22 MED ORDER — ACETAMINOPHEN 325 MG PO TABS
650.0000 mg | ORAL_TABLET | Freq: Once | ORAL | Status: AC
  Administered 2017-12-22: 650 mg via ORAL
  Filled 2017-12-22: qty 2

## 2017-12-22 MED ORDER — SODIUM CHLORIDE 0.9 % IV SOLN
375.0000 mg/m2 | Freq: Once | INTRAVENOUS | Status: AC
  Administered 2017-12-22: 800 mg via INTRAVENOUS
  Filled 2017-12-22: qty 50

## 2017-12-22 MED ORDER — SODIUM CHLORIDE 0.9 % IV SOLN
Freq: Once | INTRAVENOUS | Status: AC
  Administered 2017-12-22: 10:00:00 via INTRAVENOUS
  Filled 2017-12-22: qty 250

## 2017-12-22 NOTE — Progress Notes (Signed)
No complaints, eating well.

## 2017-12-23 NOTE — Progress Notes (Signed)
Hematology/Oncology Consult note [facility]  Telephone:([phone] [phone]  Patient Care Team: [name], MD as PCP - General (Internal Medicine)   Name of the patient: Bobby Dean  [id number]  [birth year]   Date of visit: 12/23/17  Diagnosis- autoimmune neutropenia and thrombocytopenia  Chief complaint/ Reason for visit-on treatment assessment prior to cycle 2 of Rituxan  Heme/Onc history: Patient is a 27 year old male was then referred to [place] for evaluation and management of thrombocytopenia. He has a history of autism and intellectual visibility and currently lives in a group home for the last 7 months. His medical decision maker is [name] from [place] office.   Blood work from 04/28/2016 showed white count of 2.1 with an H&H of 15.5/45.1 and a platelet count of 51. CMP was within normal limits. TSH was normal at 1.09. At this time I do not have any prior CBCs for comparison.  Patient is here with his group home Asst. [name] today who reports that patient has been doing well over the last 7 months and has no known medical problems. He does not take any medications or over-the-counter medications. He has been feeling well and has had no sick days and denies any complaints.Patient is comfortable and is able to give Limited history and denies any complaints at this time his mother died of cancer (unknown which one). Other family history is not known.  Bone marrow biopsy on 05/14/2016 showed normal trilineage hematopoiesis with no evidence of lymphoma or leukemia. Peripheral flow cytometry showed reversal of CD4 to CD8 ratio. HIV hepatitis B and hepatitis C testing was negative. B12 and folate were within normal limits. PT/PTT INR was within normal limits.  Review of peripheral smear showed leukopenia and moderate neutropenia. Thrombocytopenia. RBC morphology is normal. Scattered reactive appearing lymphocytes are  seen. No blasts or early forms noted.  Monospot testing was positive. ANA was negative. Quantitative immunoglobulins were within normal limits. Serum copper was within normal limits. CMV DNA PCR negative  Patient was started on empiric high dose steroids for possible autoimmune etiology on 05/28/16. His counts normalized. Then his steroid taper was started. His WBC remained normal but his platelets began to decline 60's.   Plan was to restart full dose steroids and switch to rituxan second line after receiving pre splenectomy vaccines in anticipation of splenectomy should he not respond to rituxan  Patient has received all his pre splenectomy vaccines. He starts rituxan on 10/14/16. Seen at [facility] for second opinion by [name] who agrees with the plan.  [name] andfollowed byslow steroid taper. Steroids were stopped in September 2018   Interval history-patient feels well..  Denies any complaints today.  Denies any fatigue, mouth sores recurrent infections or fevers  ECOG PS- 0 Pain scale- 0   Review of systems- Review of Systems  Constitutional: Negative for chills, fever, malaise/fatigue and weight loss.  HENT: Negative for congestion, ear discharge and nosebleeds.   Eyes: Negative for blurred vision.  Respiratory: Negative for cough, hemoptysis, sputum production, shortness of breath and wheezing.   Cardiovascular: Negative for chest pain, palpitations, orthopnea and claudication.  Gastrointestinal: Negative for abdominal pain, blood in stool, constipation, diarrhea, heartburn, melena, nausea and vomiting.  Genitourinary: Negative for dysuria, flank pain, frequency, hematuria and urgency.  Musculoskeletal: Negative for back pain, joint pain and myalgias.  Skin: Negative for rash.  Neurological: Negative for dizziness, tingling, focal weakness, seizures, weakness and headaches.  Endo/Heme/Allergies: Does not bruise/bleed easily.    Psychiatric/Behavioral: Negative for depression and suicidal ideas. The patient does not have insomnia.       No Known Allergies   Past Medical History:  Diagnosis Date  . Anxiety   . Autism   . Autism   . 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 No pertinent surgical history.  Social History   Socioeconomic History  . Marital status: Single    Spouse name: Not on file  . Number of children: Not on file  . Years of education: Not on file  . Highest education level: Not on file  Occupational History  . Not on file  Social Needs  . Financial resource strain: Not on file  . Food insecurity:    Worry: Not on file    Inability: Not on file  . Transportation needs:    Medical: Not on file    Non-medical: Not on file  Tobacco Use  . Smoking status: Never Smoker  . Smokeless tobacco: Never Used  Substance and Sexual Activity  . Alcohol use: No  . Drug use: No  . Sexual activity: Never  Lifestyle  . Physical activity:    Days per week: Not on file    Minutes per session: Not on file  . Stress: Not on file  Relationships  . Social connections:    Talks on phone: Not on file    Gets together: Not on file    Attends religious service: Not on file    Active member of club or organization: Not on file    Attends meetings of clubs or organizations: Not on file    Relationship status: Not on file  . Intimate partner violence:    Fear of current or ex partner: Not on file    Emotionally abused: Not on file    Physically abused: Not on file    Forced sexual activity: Not on file  Other Topics Concern  . Not on file  Social History Narrative  . Not on file    Family History  Problem Relation Age of Onset  . Cancer Mother      Current Outpatient Medications:  .  predniSONE (DELTASONE) 20 MG tablet, Take 3 tablets (60 mg total) by mouth daily with breakfast., Disp: 90 tablet, Rfl: 0  Physical exam:  Vitals:   12/22/17 0854  BP: (!) 146/96   Pulse: 83  Resp: 18  Temp: 98.1 F (36.7 C)  TempSrc: Tympanic  Weight: 188 lb (85.3 kg)  Height: 6' (1.829 m)   Physical Exam  Constitutional: He is oriented to person, place, and time. He appears well-developed and well-nourished.  HENT:  Head: Normocephalic and atraumatic.  Mouth/Throat: Oropharynx is clear and moist.  Eyes: Pupils are equal, round, and reactive to light. EOM are normal.  Neck: Normal range of motion.  Cardiovascular: Normal rate, regular rhythm and normal heart sounds.  Pulmonary/Chest: Effort normal and breath sounds normal.  Abdominal: Soft. Bowel sounds are normal.  Neurological: He is alert and oriented to person, place, and time.  Skin: Skin is warm and dry.     CMP Latest Ref Rng & Units 12/22/2017  Glucose 70 - 99 mg/dL 92  BUN 6 - 20 mg/dL 12  Creatinine 0.61 - 1.24 mg/dL 0.78  Sodium 135 - 145 mmol/L 142  Potassium 3.5 - 5.1 mmol/L 3.3(L)  Chloride 98 - 111 mmol/L 103  CO2 22 - 32 mmol/L 29  Calcium 8.9 -  10.3 mg/dL 9.6  Total Protein 6.5 - 8.1 g/dL 7.2  Total Bilirubin 0.3 - 1.2 mg/dL 1.0  Alkaline Phos 38 - 126 U/L 70  AST 15 - 41 U/L 17  ALT 0 - 44 U/L 14   CBC Latest Ref Rng & Units 12/22/2017  WBC 4.0 - 10.5 K/uL 1.8(L)  Hemoglobin 13.0 - 17.0 g/dL 15.3  Hematocrit 39.0 - 52.0 % 45.5  Platelets 150 - 400 K/uL 47(L)      Assessment and plan- Patient is a 27 y.o. 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  He is here for on treatment assessment prior to cycle 2 of weekly Rituxan  Patient continues to be on 60 mg of prednisone.  He has received 1 dose of Rituxan so far in the last week and there is been no significant improvement in his counts so far.  We will proceed with second dose of Rituxan today.  I will see him back in 1 week with CBC and CMP for cycle 3 of Rituxan.  If his neutropenia and  thrombocytopenia does not respond to steroids and Rituxan, we may be looking at splenectomy.    Visit Diagnosis 1. 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 Encounter for monitoring rituximab therapy      [name], MD, MPH [facility] at [facility] [phone] 12/23/2017 12:37 PM

## 2017-12-29 ENCOUNTER — Inpatient Hospital Stay (HOSPITAL_BASED_OUTPATIENT_CLINIC_OR_DEPARTMENT_OTHER): Payer: Medicare Other | Admitting: Oncology

## 2017-12-29 ENCOUNTER — Inpatient Hospital Stay: Payer: Medicare Other

## 2017-12-29 ENCOUNTER — Other Ambulatory Visit: Payer: Self-pay

## 2017-12-29 ENCOUNTER — Encounter: Payer: Self-pay | Admitting: Oncology

## 2017-12-29 VITALS — BP 128/88 | HR 68 | Temp 97.2°F | Resp 18 | Ht 72.0 in | Wt 185.8 lb

## 2017-12-29 VITALS — BP 129/91 | HR 74 | Temp 96.2°F

## 2017-12-29 DIAGNOSIS — Z79899 Other long term (current) drug therapy: Secondary | ICD-10-CM

## 2017-12-29 DIAGNOSIS — Z5181 Encounter for therapeutic drug level monitoring: Secondary | ICD-10-CM

## 2017-12-29 DIAGNOSIS — F840 Autistic disorder: Secondary | ICD-10-CM | POA: Diagnosis not present

## 2017-12-29 DIAGNOSIS — D61818 Other pancytopenia: Secondary | ICD-10-CM

## 2017-12-29 DIAGNOSIS — D8989 Other specified disorders involving the immune mechanism, not elsewhere classified: Secondary | ICD-10-CM | POA: Diagnosis not present

## 2017-12-29 DIAGNOSIS — Z5112 Encounter for antineoplastic immunotherapy: Secondary | ICD-10-CM | POA: Diagnosis not present

## 2017-12-29 LAB — CBC WITH DIFFERENTIAL/PLATELET
Abs Immature Granulocytes: 0.01 10*3/uL (ref 0.00–0.07)
Basophils Absolute: 0 10*3/uL (ref 0.0–0.1)
Basophils Relative: 0 %
EOS ABS: 0 10*3/uL (ref 0.0–0.5)
EOS PCT: 0 %
HEMATOCRIT: 45.3 % (ref 39.0–52.0)
Hemoglobin: 15 g/dL (ref 13.0–17.0)
IMMATURE GRANULOCYTES: 0 %
LYMPHS ABS: 1.1 10*3/uL (ref 0.7–4.0)
Lymphocytes Relative: 24 %
MCH: 27.7 pg (ref 26.0–34.0)
MCHC: 33.1 g/dL (ref 30.0–36.0)
MCV: 83.7 fL (ref 80.0–100.0)
MONOS PCT: 9 %
Monocytes Absolute: 0.4 10*3/uL (ref 0.1–1.0)
NEUTROS PCT: 67 %
Neutro Abs: 3 10*3/uL (ref 1.7–7.7)
Platelets: 148 10*3/uL — ABNORMAL LOW (ref 150–400)
RBC: 5.41 MIL/uL (ref 4.22–5.81)
RDW: 12.6 % (ref 11.5–15.5)
WBC: 4.5 10*3/uL (ref 4.0–10.5)
nRBC: 0 % (ref 0.0–0.2)

## 2017-12-29 LAB — COMPREHENSIVE METABOLIC PANEL
ALBUMIN: 4.2 g/dL (ref 3.5–5.0)
ALK PHOS: 63 U/L (ref 38–126)
ALT: 14 U/L (ref 0–44)
AST: 14 U/L — AB (ref 15–41)
Anion gap: 6 (ref 5–15)
BUN: 11 mg/dL (ref 6–20)
CALCIUM: 9.4 mg/dL (ref 8.9–10.3)
CO2: 33 mmol/L — AB (ref 22–32)
CREATININE: 0.67 mg/dL (ref 0.61–1.24)
Chloride: 101 mmol/L (ref 98–111)
GFR calc Af Amer: >60 mL/min (ref >60)
GFR calc non Af Amer: >60 mL/min (ref >60)
GLUCOSE: 93 mg/dL (ref 70–99)
Potassium: 3.5 mmol/L (ref 3.5–5.1)
SODIUM: 140 mmol/L (ref 135–145)
Total Bilirubin: 0.8 mg/dL (ref 0.3–1.2)
Total Protein: 7 g/dL (ref 6.5–8.1)

## 2017-12-29 MED ORDER — SODIUM CHLORIDE 0.9 % IV SOLN: 375.0000 mg/m2 | Freq: Once | INTRAVENOUS | Status: DC

## 2017-12-29 MED ORDER — SODIUM CHLORIDE 0.9 % IV SOLN
Freq: Once | INTRAVENOUS | Status: AC
  Administered 2017-12-29: 10:00:00 via INTRAVENOUS
  Filled 2017-12-29: qty 250

## 2017-12-29 MED ORDER — ACETAMINOPHEN 325 MG PO TABS
650.0000 mg | ORAL_TABLET | Freq: Once | ORAL | Status: AC
  Administered 2017-12-29: 650 mg via ORAL
  Filled 2017-12-29: qty 2

## 2017-12-29 MED ORDER — SODIUM CHLORIDE 0.9 % IV SOLN
375.0000 mg/m2 | Freq: Once | INTRAVENOUS | Status: AC
  Administered 2017-12-29: 800 mg via INTRAVENOUS
  Filled 2017-12-29: qty 50

## 2017-12-29 MED ORDER — DIPHENHYDRAMINE HCL 25 MG PO CAPS
50.0000 mg | ORAL_CAPSULE | Freq: Once | ORAL | Status: AC
  Administered 2017-12-29: 50 mg via ORAL
  Filled 2017-12-29: qty 2

## 2017-12-29 NOTE — Progress Notes (Signed)
No new changes noted today 

## 2017-12-31 NOTE — Progress Notes (Signed)
Hematology/Oncology Consult note [facility]  [phone] [phone]  Patient Care Team: Bobby Athens, MD as PCP - General (Internal Medicine)   Name of the patient: Bobby Dean  [id number]  [birth year]   Date of visit: 12/31/17  Diagnosis- autoimmune neutropenia and thrombocytopenia   Chief complaint/ Reason for visit- on treatment assessment prior to cycle 3 of weekly rituxan  Heme/Onc history: Patient is a 27 year old male was then referred to [place] for evaluation and management of thrombocytopenia. He has a history of autism and intellectual visibility and currently lives in a group home for the last 7 months. His medical decision maker is Mr. Bobby Dean from [place] office.   Blood work from 04/28/2016 showed white count of 2.1 with an H&H of 15.5/45.1 and a platelet count of 51. CMP was within normal limits. TSH was normal at 1.09. At this time I do not have any prior CBCs for comparison.  Patient is here with his group home Asst. Mrs. Bobby Dean today who reports that patient has been doing well over the last 7 months and has no known medical problems. He does not take any medications or over-the-counter medications. He has been feeling well and has had no sick days and denies any complaints.Patient is comfortable and is able to give Limited history and denies any complaints at this time his mother died of cancer (unknown which one). Other family history is not known.  Bone marrow biopsy on 05/14/2016 showed normal trilineage hematopoiesis with no evidence of lymphoma or leukemia. Peripheral flow cytometry showed reversal of CD4 to CD8 ratio. HIV hepatitis B and hepatitis C testing was negative. B12 and folate were within normal limits. PT/PTT INR was within normal limits.  Review of peripheral smear showed leukopenia and moderate neutropenia. Thrombocytopenia. RBC morphology is normal. Scattered reactive appearing  lymphocytes are seen. No blasts or early forms noted.  Monospot testing was positive. ANA was negative. Quantitative immunoglobulins were within normal limits. Serum copper was within normal limits. CMV DNA PCR negative  Patient was started on empiric high dose steroids for possible autoimmune etiology on 05/28/16. His counts normalized. Then his steroid taper was started. His WBC remained normal but his platelets began to decline 60's.   Plan was to restart full dose steroids and switch to rituxan second line after receiving pre splenectomy vaccines in anticipation of splenectomy should he not respond to rituxan  Patient has received all his pre splenectomy vaccines. He starts rituxan on 10/14/16. Seen at [facility] for second opinion by Dr. Evelene Dean who agrees with the plan.  [name] andfollowed byslow steroid taper. Steroids were stopped in September 2018  recurrent neutropenia/ TCP noted in sept 2019. rituxan and steroids restarted on 12/15/17  Interval history- he is doing well. Denies any complaints today. Tolerating steroids well with no side effects.   ECOG PS- 0 Pain scale- 0  Review of systems- Review of Systems  Constitutional: Negative for chills, fever, malaise/fatigue and weight loss.  HENT: Negative for congestion, ear discharge and nosebleeds.   Eyes: Negative for blurred vision.  Respiratory: Negative for cough, hemoptysis, sputum production, shortness of breath and wheezing.   Cardiovascular: Negative for chest pain, palpitations, orthopnea and claudication.  Gastrointestinal: Negative for abdominal pain, blood in stool, constipation, diarrhea, heartburn, melena, nausea and vomiting.  Genitourinary: Negative for dysuria, flank pain, frequency, hematuria and urgency.  Musculoskeletal: Negative for back pain, joint pain and myalgias.  Skin: Negative  for rash.  Neurological: Negative for dizziness, tingling, focal weakness, seizures,  weakness and headaches.  Endo/Heme/Allergies: Does not bruise/bleed easily.  Psychiatric/Behavioral: Negative for depression and suicidal ideas. The patient does not have insomnia.       No Known Allergies   Past Medical History:  Diagnosis Date  . Anxiety   . Autism   . Autism   . 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 No pertinent surgical history.  Social History   Socioeconomic History  . Marital status: Single    Spouse name: Not on file  . Number of children: Not on file  . Years of education: Not on file  . Highest education level: Not on file  Occupational History  . Not on file  Social Needs  . Financial resource strain: Not on file  . Food insecurity:    Worry: Not on file    Inability: Not on file  . Transportation needs:    Medical: Not on file    Non-medical: Not on file  Tobacco Use  . Smoking status: Never Smoker  . Smokeless tobacco: Never Used  Substance and Sexual Activity  . Alcohol use: No  . Drug use: No  . Sexual activity: Never  Lifestyle  . Physical activity:    Days per week: Not on file    Minutes per session: Not on file  . Stress: Not on file  Relationships  . Social connections:    Talks on phone: Not on file    Gets together: Not on file    Attends religious service: Not on file    Active member of club or organization: Not on file    Attends meetings of clubs or organizations: Not on file    Relationship status: Not on file  . Intimate partner violence:    Fear of current or ex partner: Not on file    Emotionally abused: Not on file    Physically abused: Not on file    Forced sexual activity: Not on file  Other Topics Concern  . Not on file  Social History Narrative  . Not on file    Family History  Problem Relation Age of Onset  . Cancer Mother      Current Outpatient Medications:  .  predniSONE (DELTASONE) 20 MG tablet, Take 3 tablets (60 mg total) by mouth daily with breakfast., Disp: 90  tablet, Rfl: 0  Physical exam:  Vitals:   12/29/17 0910  BP: 128/88  Pulse: 68  Resp: 18  Temp: (!) 97.2 F (36.2 C)  TempSrc: Tympanic  SpO2: 98%  Weight: 185 lb 12.8 oz (84.3 kg)  Height: 6' (1.829 m)   Physical Exam  Constitutional: He is oriented to person, place, and time. He appears well-developed and well-nourished.  HENT:  Head: Normocephalic and atraumatic.  Mouth/Throat: Oropharynx is clear and moist.  Eyes: Pupils are equal, round, and reactive to light. EOM are normal.  Neck: Normal range of motion.  Cardiovascular: Normal rate, regular rhythm and normal heart sounds.  Pulmonary/Chest: Effort normal and breath sounds normal.  Abdominal: Soft. Bowel sounds are normal.  Neurological: He is alert and oriented to person, place, and time.  Skin: Skin is warm and dry.     CMP Latest Ref Rng & Units 12/29/2017  Glucose 70 - 99 mg/dL 93  BUN 6 - 20 mg/dL 11  Creatinine 0.61 - 1.24 mg/dL 0.67  Sodium 135 - 145 mmol/L 140  Potassium 3.5 - 5.1  mmol/L 3.5  Chloride 98 - 111 mmol/L 101  CO2 22 - 32 mmol/L 33(H)  Calcium 8.9 - 10.3 mg/dL 9.4  Total Protein 6.5 - 8.1 g/dL 7.0  Total Bilirubin 0.3 - 1.2 mg/dL 0.8  Alkaline Phos 38 - 126 U/L 63  AST 15 - 41 U/L 14(L)  ALT 0 - 44 U/L 14   CBC Latest Ref Rng & Units 12/29/2017  WBC 4.0 - 10.5 K/uL 4.5  Hemoglobin 13.0 - 17.0 g/dL 15.0  Hematocrit 39.0 - 52.0 % 45.3  Platelets 150 - 400 K/uL 148(L)    Assessment and plan- Patient is a 27 y.o. 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 ANC is 3. Platelet count improved to 148. Proceed with 3rd weekly dose of rituxan today. He will get 4th dose next week.check cbc with diff/ CMP  I will see him back  in 2 weeks with cbc with diff. At that point I will start his slow steroid taper    Visit Diagnosis 1. 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 Visit for monitoring Rituxan therapy      [name], MD, MPH [facility] at [facility] [phone] 12/31/2017 7:35 AM

## 2018-01-03 ENCOUNTER — Other Ambulatory Visit: Payer: Self-pay

## 2018-01-03 DIAGNOSIS — D61818 Other pancytopenia: Secondary | ICD-10-CM

## 2018-01-05 ENCOUNTER — Telehealth: Payer: Self-pay | Admitting: *Deleted

## 2018-01-05 ENCOUNTER — Inpatient Hospital Stay: Payer: Medicare Other

## 2018-01-05 ENCOUNTER — Other Ambulatory Visit: Payer: Self-pay | Admitting: Oncology

## 2018-01-05 VITALS — BP 128/84 | HR 69 | Temp 97.7°F | Wt 185.4 lb

## 2018-01-05 DIAGNOSIS — D8989 Other specified disorders involving the immune mechanism, not elsewhere classified: Secondary | ICD-10-CM | POA: Diagnosis not present

## 2018-01-05 DIAGNOSIS — Z5112 Encounter for antineoplastic immunotherapy: Secondary | ICD-10-CM | POA: Diagnosis not present

## 2018-01-05 DIAGNOSIS — F840 Autistic disorder: Secondary | ICD-10-CM | POA: Diagnosis not present

## 2018-01-05 DIAGNOSIS — D61818 Other pancytopenia: Secondary | ICD-10-CM

## 2018-01-05 LAB — COMPREHENSIVE METABOLIC PANEL
ALBUMIN: 4.2 g/dL (ref 3.5–5.0)
ALK PHOS: 58 U/L (ref 38–126)
ALT: 13 U/L (ref 0–44)
ANION GAP: 6 (ref 5–15)
AST: 15 U/L (ref 15–41)
BILIRUBIN TOTAL: 0.7 mg/dL (ref 0.3–1.2)
BUN: 12 mg/dL (ref 6–20)
CALCIUM: 9.3 mg/dL (ref 8.9–10.3)
CO2: 31 mmol/L (ref 22–32)
Chloride: 104 mmol/L (ref 98–111)
Creatinine, Ser: 0.75 mg/dL (ref 0.61–1.24)
GFR calc Af Amer: >60 mL/min (ref >60)
GLUCOSE: 91 mg/dL (ref 70–99)
POTASSIUM: 3.4 mmol/L — AB (ref 3.5–5.1)
Sodium: 141 mmol/L (ref 135–145)
TOTAL PROTEIN: 6.9 g/dL (ref 6.5–8.1)

## 2018-01-05 LAB — CBC WITH DIFFERENTIAL/PLATELET
ABS IMMATURE GRANULOCYTES: 0.01 10*3/uL (ref 0.00–0.07)
BASOS PCT: 0 %
Basophils Absolute: 0 10*3/uL (ref 0.0–0.1)
EOS ABS: 0 10*3/uL (ref 0.0–0.5)
Eosinophils Relative: 0 %
HEMATOCRIT: 45 % (ref 39.0–52.0)
Hemoglobin: 14.9 g/dL (ref 13.0–17.0)
IMMATURE GRANULOCYTES: 0 %
Lymphocytes Relative: 9 %
Lymphs Abs: 0.6 10*3/uL — ABNORMAL LOW (ref 0.7–4.0)
MCH: 28.2 pg (ref 26.0–34.0)
MCHC: 33.1 g/dL (ref 30.0–36.0)
MCV: 85.2 fL (ref 80.0–100.0)
MONO ABS: 0.3 10*3/uL (ref 0.1–1.0)
MONOS PCT: 5 %
NEUTROS ABS: 5.6 10*3/uL (ref 1.7–7.7)
Neutrophils Relative %: 86 %
Platelets: 191 10*3/uL (ref 150–400)
RBC: 5.28 MIL/uL (ref 4.22–5.81)
RDW: 12.7 % (ref 11.5–15.5)
WBC: 6.5 10*3/uL (ref 4.0–10.5)
nRBC: 0 % (ref 0.0–0.2)

## 2018-01-05 MED ORDER — SODIUM CHLORIDE 0.9 % IV SOLN
Freq: Once | INTRAVENOUS | Status: AC
  Administered 2018-01-05: 10:00:00 via INTRAVENOUS
  Filled 2018-01-05: qty 250

## 2018-01-05 MED ORDER — ACETAMINOPHEN 325 MG PO TABS
650.0000 mg | ORAL_TABLET | Freq: Once | ORAL | Status: AC
  Administered 2018-01-05: 650 mg via ORAL
  Filled 2018-01-05: qty 2

## 2018-01-05 MED ORDER — SODIUM CHLORIDE 0.9 % IV SOLN: 375.0000 mg/m2 | Freq: Once | INTRAVENOUS | Status: DC

## 2018-01-05 MED ORDER — DIPHENHYDRAMINE HCL 25 MG PO CAPS
50.0000 mg | ORAL_CAPSULE | Freq: Once | ORAL | Status: AC
  Administered 2018-01-05: 50 mg via ORAL
  Filled 2018-01-05: qty 2

## 2018-01-05 MED ORDER — SODIUM CHLORIDE 0.9 % IV SOLN
375.0000 mg/m2 | Freq: Once | INTRAVENOUS | Status: AC
  Administered 2018-01-05: 800 mg via INTRAVENOUS
  Filled 2018-01-05: qty 30

## 2018-01-05 NOTE — Telephone Encounter (Signed)
Yes. I will see him next week and talk about taper at that time

## 2018-01-05 NOTE — Telephone Encounter (Signed)
Toniann Fail called asking if patient is to continue his prednisone now that he has completed his infusions. Please advise

## 2018-01-05 NOTE — Telephone Encounter (Signed)
Spoke with Toniann Fail advised of Dr Assunta Gambles response

## 2018-01-10 ENCOUNTER — Other Ambulatory Visit: Payer: Self-pay

## 2018-01-10 DIAGNOSIS — D61818 Other pancytopenia: Secondary | ICD-10-CM

## 2018-01-12 ENCOUNTER — Encounter: Payer: Self-pay | Admitting: Oncology

## 2018-01-12 ENCOUNTER — Inpatient Hospital Stay (HOSPITAL_BASED_OUTPATIENT_CLINIC_OR_DEPARTMENT_OTHER): Payer: Medicare Other | Admitting: Oncology

## 2018-01-12 ENCOUNTER — Inpatient Hospital Stay: Payer: Medicare Other

## 2018-01-12 VITALS — BP 141/84 | HR 71 | Temp 97.6°F | Resp 18 | Ht 72.0 in | Wt 183.4 lb

## 2018-01-12 DIAGNOSIS — D61818 Other pancytopenia: Secondary | ICD-10-CM

## 2018-01-12 DIAGNOSIS — F840 Autistic disorder: Secondary | ICD-10-CM | POA: Diagnosis not present

## 2018-01-12 DIAGNOSIS — D8989 Other specified disorders involving the immune mechanism, not elsewhere classified: Secondary | ICD-10-CM | POA: Diagnosis not present

## 2018-01-12 DIAGNOSIS — Z5112 Encounter for antineoplastic immunotherapy: Secondary | ICD-10-CM | POA: Diagnosis not present

## 2018-01-12 LAB — CBC WITH DIFFERENTIAL/PLATELET
Abs Immature Granulocytes: 0.01 10*3/uL (ref 0.00–0.07)
BASOS PCT: 0 %
Basophils Absolute: 0 10*3/uL (ref 0.0–0.1)
EOS ABS: 0 10*3/uL (ref 0.0–0.5)
Eosinophils Relative: 0 %
HEMATOCRIT: 45.7 % (ref 39.0–52.0)
Hemoglobin: 15.4 g/dL (ref 13.0–17.0)
Immature Granulocytes: 0 %
LYMPHS ABS: 0.4 10*3/uL — AB (ref 0.7–4.0)
Lymphocytes Relative: 7 %
MCH: 28.3 pg (ref 26.0–34.0)
MCHC: 33.7 g/dL (ref 30.0–36.0)
MCV: 84 fL (ref 80.0–100.0)
MONO ABS: 0.1 10*3/uL (ref 0.1–1.0)
Monocytes Relative: 1 %
Neutro Abs: 4.6 10*3/uL (ref 1.7–7.7)
Neutrophils Relative %: 92 %
PLATELETS: 159 10*3/uL (ref 150–400)
RBC: 5.44 MIL/uL (ref 4.22–5.81)
RDW: 12.8 % (ref 11.5–15.5)
WBC: 5 10*3/uL (ref 4.0–10.5)
nRBC: 0 % (ref 0.0–0.2)

## 2018-01-12 LAB — COMPREHENSIVE METABOLIC PANEL
ALBUMIN: 4.4 g/dL (ref 3.5–5.0)
ALK PHOS: 58 U/L (ref 38–126)
ALT: 16 U/L (ref 0–44)
AST: 16 U/L (ref 15–41)
Anion gap: 7 (ref 5–15)
BILIRUBIN TOTAL: 1 mg/dL (ref 0.3–1.2)
BUN: 14 mg/dL (ref 6–20)
CALCIUM: 9.3 mg/dL (ref 8.9–10.3)
CO2: 27 mmol/L (ref 22–32)
Chloride: 105 mmol/L (ref 98–111)
Creatinine, Ser: 0.69 mg/dL (ref 0.61–1.24)
GFR calc Af Amer: >60 mL/min (ref >60)
GFR calc non Af Amer: >60 mL/min (ref >60)
GLUCOSE: 108 mg/dL — AB (ref 70–99)
POTASSIUM: 4 mmol/L (ref 3.5–5.1)
SODIUM: 139 mmol/L (ref 135–145)
TOTAL PROTEIN: 7.4 g/dL (ref 6.5–8.1)

## 2018-01-12 MED ORDER — PREDNISONE 10 MG PO TABS: 10.0000 mg | ORAL_TABLET | ORAL | 0 refills | Status: DC

## 2018-01-12 NOTE — Progress Notes (Signed)
No new changes noted today 

## 2018-01-13 NOTE — Progress Notes (Signed)
Hematology/Oncology Consult note [facility]  Telephone:([phone] Fax:([phone]  Patient Care Team: [name], MD as PCP - General (Internal Medicine)   Name of the patient: Bobby Dean  [id number]  02-Sep-1990   Date of visit: 01/13/18  Diagnosis- autoimmune neutropenia and thrombocytopenia   Chief complaint/ Reason for visit-discuss steroid taper  Heme/Onc history: Patient is a 27 year old male was then referred to [place] for evaluation and management of thrombocytopenia. He has a history of autism and intellectual visibility and currently lives in a group home for the last 7 months. His medical decision maker is [name] from [place] office.   Blood work from 04/28/2016 showed white count of 2.1 with an H&H of 15.5/45.1 and a platelet count of 51. CMP was within normal limits. TSH was normal at 1.09. At this time I do not have any prior CBCs for comparison.  Patient is here with his group home Asst. [name] today who reports that patient has been doing well over the last 7 months and has no known medical problems. He does not take any medications or over-the-counter medications. He has been feeling well and has had no sick days and denies any complaints.Patient is comfortable and is able to give Limited history and denies any complaints at this time his mother died of cancer (unknown which one). Other family history is not known.  Bone marrow biopsy on 05/14/2016 showed normal trilineage hematopoiesis with no evidence of lymphoma or leukemia. Peripheral flow cytometry showed reversal of CD4 to CD8 ratio. HIV hepatitis B and hepatitis C testing was negative. B12 and folate were within normal limits. PT/PTT INR was within normal limits.  Review of peripheral smear showed leukopenia and moderate neutropenia. Thrombocytopenia. RBC morphology is normal. Scattered reactive appearing lymphocytes are seen. No blasts or early forms  noted.  Monospot testing was positive. ANA was negative. Quantitative immunoglobulins were within normal limits. Serum copper was within normal limits. CMV DNA PCR negative  Patient was started on empiric high dose steroids for possible autoimmune etiology on 05/28/16. His counts normalized. Then his steroid taper was started. His WBC remained normal but his platelets began to decline 60's.   Plan was to restart full dose steroids and switch to rituxan second line after receiving pre splenectomy vaccines in anticipation of splenectomy should he not respond to rituxan  Patient has received all his pre splenectomy vaccines. He starts rituxan on 10/14/16. Seen at [facility] for second opinion by [name] who agrees with the plan.  [name] andfollowed byslow steroid taper. Steroids were stopped in September 2018  recurrent neutropenia/ TCP noted in sept 2019. rituxan and steroids restarted on 12/15/17  Interval history-he is doing well.  Denies any changes in his appetite or unintentional weight loss.  Denies any fatigue.  Reports no side effects from steroids.  ECOG PS- 0 Pain scale- 0 Opioid associated constipation- no  Review of systems- Review of Systems  Constitutional: Negative for chills, fever, malaise/fatigue and weight loss.  HENT: Negative for congestion, ear discharge and nosebleeds.   Eyes: Negative for blurred vision.  Respiratory: Negative for cough, hemoptysis, sputum production, shortness of breath and wheezing.   Cardiovascular: Negative for chest pain, palpitations, orthopnea and claudication.  Gastrointestinal: Negative for abdominal pain, blood in stool, constipation, diarrhea, heartburn, melena, nausea and vomiting.  Genitourinary: Negative for dysuria, flank pain, frequency, hematuria and urgency.  Musculoskeletal: Negative for back pain, joint pain  and myalgias.  Skin: Negative for rash.  Neurological: Negative for dizziness,  tingling, focal weakness, seizures, weakness and headaches.  Endo/Heme/Allergies: Does not bruise/bleed easily.  Psychiatric/Behavioral: Negative for depression and suicidal ideas. The patient does not have insomnia.        No Known Allergies   Past Medical History:  Diagnosis Date  . Anxiety   . Autism   . Autism   . 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 No pertinent surgical history.  Social History   Socioeconomic History  . Marital status: Single    Spouse name: Not on file  . Number of children: Not on file  . Years of education: Not on file  . Highest education level: Not on file  Occupational History  . Not on file  Social Needs  . Financial resource strain: Not on file  . Food insecurity:    Worry: Not on file    Inability: Not on file  . Transportation needs:    Medical: Not on file    Non-medical: Not on file  Tobacco Use  . Smoking status: Never Smoker  . Smokeless tobacco: Never Used  Substance and Sexual Activity  . Alcohol use: No  . Drug use: No  . Sexual activity: Never  Lifestyle  . Physical activity:    Days per week: Not on file    Minutes per session: Not on file  . Stress: Not on file  Relationships  . Social connections:    Talks on phone: Not on file    Gets together: Not on file    Attends religious service: Not on file    Active member of club or organization: Not on file    Attends meetings of clubs or organizations: Not on file    Relationship status: Not on file  . Intimate partner violence:    Fear of current or ex partner: Not on file    Emotionally abused: Not on file    Physically abused: Not on file    Forced sexual activity: Not on file  Other Topics Concern  . Not on file  Social History Narrative  . Not on file    Family History  Problem Relation Age of Onset  . Cancer Mother      Current Outpatient Medications:  .  predniSONE (DELTASONE) 10 MG tablet, Take 1 tablet (10 mg total) by mouth  as directed. Take meds with food.  Start 50 mg daily x 3 days, then 40 mg daily x 3 days, then 30 mg daily x 3 day, then 20 mg daily x 3 days , then 20 mg daily x 3 days, then 10 mg daily x 3 days and then 5 mg daily x 3 days, Disp: 83 tablet, Rfl: 0  Physical exam:  Vitals:   01/12/18 1049  BP: (!) 141/84  Pulse: 71  Resp: 18  Temp: 97.6 F (36.4 C)  TempSrc: Tympanic  SpO2: 98%  Weight: 183 lb 6.4 oz (83.2 kg)  Height: 6' (1.829 m)   Physical Exam  Constitutional: He is oriented to person, place, and time. He appears well-developed and well-nourished.  HENT:  Head: Normocephalic and atraumatic.  Mouth/Throat: Oropharynx is clear and moist.  Eyes: Pupils are equal, round, and reactive to light. EOM are normal.  Neck: Normal range of motion.  Cardiovascular: Normal rate, regular rhythm and normal heart sounds.  Pulmonary/Chest: Effort normal and breath sounds normal.  Abdominal: Soft. Bowel sounds are normal.  Neurological:  He is alert and oriented to person, place, and time.  Skin: Skin is warm and dry.     CMP Latest Ref Rng & Units 01/12/2018  Glucose 70 - 99 mg/dL 108(H)  BUN 6 - 20 mg/dL 14  Creatinine 0.61 - 1.24 mg/dL 0.69  Sodium 135 - 145 mmol/L 139  Potassium 3.5 - 5.1 mmol/L 4.0  Chloride 98 - 111 mmol/L 105  CO2 22 - 32 mmol/L 27  Calcium 8.9 - 10.3 mg/dL 9.3  Total Protein 6.5 - 8.1 g/dL 7.4  Total Bilirubin 0.3 - 1.2 mg/dL 1.0  Alkaline Phos 38 - 126 U/L 58  AST 15 - 41 U/L 16  ALT 0 - 44 U/L 16   CBC Latest Ref Rng & Units 01/12/2018  WBC 4.0 - 10.5 K/uL 5.0  Hemoglobin 13.0 - 17.0 g/dL 15.4  Hematocrit 39.0 - 52.0 % 45.7  Platelets 150 - 400 K/uL 159      Assessment and plan- Patient is a 27 y.o. 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  He developed recurrent neutropenia and thrombocytopenia a year later and is status post 4 cycles of weekly Rituxan.  Patient has  completed 4 weekly cycles of Rituxan and his white count and platelet counts are now normalized.  He is currently taking 60 mg of prednisone for the last 1 month.  I will decrease his dose to 50 mg starting today which she will continue every 3 days decreasing the dose by 5 mg to complete his steroid taper in 1 month.  Repeat CBC with differential in 2 weeks in 4 weeks and I will see him back in 4 weeks   Visit Diagnosis 1. 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, MD, MPH [facility] [phone] 01/13/2018 12:51 PM

## 2018-01-25 ENCOUNTER — Inpatient Hospital Stay: Payer: Medicare Other | Attending: Oncology

## 2018-01-25 DIAGNOSIS — D61818 Other pancytopenia: Secondary | ICD-10-CM | POA: Insufficient documentation

## 2018-01-25 LAB — CBC WITH DIFFERENTIAL/PLATELET
Abs Immature Granulocytes: 0 10*3/uL (ref 0.00–0.07)
Basophils Absolute: 0 10*3/uL (ref 0.0–0.1)
Basophils Relative: 1 %
EOS PCT: 1 %
Eosinophils Absolute: 0 10*3/uL (ref 0.0–0.5)
HEMATOCRIT: 45.8 % (ref 39.0–52.0)
HEMOGLOBIN: 15.3 g/dL (ref 13.0–17.0)
Immature Granulocytes: 0 %
LYMPHS ABS: 1.4 10*3/uL (ref 0.7–4.0)
LYMPHS PCT: 47 %
MCH: 28.4 pg (ref 26.0–34.0)
MCHC: 33.4 g/dL (ref 30.0–36.0)
MCV: 85.1 fL (ref 80.0–100.0)
MONO ABS: 0.7 10*3/uL (ref 0.1–1.0)
Monocytes Relative: 23 %
Neutro Abs: 0.8 10*3/uL — ABNORMAL LOW (ref 1.7–7.7)
Neutrophils Relative %: 28 %
Platelets: 164 10*3/uL (ref 150–400)
RBC: 5.38 MIL/uL (ref 4.22–5.81)
RDW: 12.6 % (ref 11.5–15.5)
WBC: 2.9 10*3/uL — ABNORMAL LOW (ref 4.0–10.5)
nRBC: 0 % (ref 0.0–0.2)

## 2018-01-26 ENCOUNTER — Telehealth: Payer: Self-pay | Admitting: *Deleted

## 2018-01-26 ENCOUNTER — Other Ambulatory Visit: Payer: Medicare Other

## 2018-01-26 MED ORDER — PREDNISONE 10 MG PO TABS: 50.0000 mg | ORAL_TABLET | Freq: Every day | ORAL | 0 refills | Status: DC

## 2018-01-26 NOTE — Telephone Encounter (Signed)
Called Bobby Dean at the group home about Bobby Dean today.  His white count has decreased so Bobby Dean wants him back on his full 50 mg of prednisone daily until he comes back to see Bobby Dean in December and Bobby Dean will decide based on lab values whether or not he can start tapering again after he sees Bobby Dean in December.  When he agreeable to plan and I will send a prescription in for the 50 mg daily

## 2018-01-26 NOTE — Telephone Encounter (Signed)
-----   Message from Creig HinesArchana C Rao, MD sent at 01/26/2018  8:14 AM EST ----- Please let patient know we are to increase his prednisone back to 50 mg daily and he will stay on that dose until I see him

## 2018-01-30 ENCOUNTER — Other Ambulatory Visit: Payer: Medicare Other

## 2018-02-14 ENCOUNTER — Other Ambulatory Visit: Payer: Medicare Other

## 2018-02-14 ENCOUNTER — Ambulatory Visit: Payer: Medicare Other | Admitting: Oncology

## 2018-02-17 ENCOUNTER — Inpatient Hospital Stay (HOSPITAL_BASED_OUTPATIENT_CLINIC_OR_DEPARTMENT_OTHER): Payer: Medicare Other | Admitting: Oncology

## 2018-02-17 ENCOUNTER — Other Ambulatory Visit: Payer: Self-pay

## 2018-02-17 ENCOUNTER — Inpatient Hospital Stay: Payer: Medicare Other | Attending: Oncology

## 2018-02-17 VITALS — BP 148/88 | HR 96 | Temp 97.0°F | Resp 18 | Wt 183.0 lb

## 2018-02-17 DIAGNOSIS — D61818 Other pancytopenia: Secondary | ICD-10-CM | POA: Diagnosis not present

## 2018-02-17 DIAGNOSIS — Z9081 Acquired absence of spleen: Secondary | ICD-10-CM | POA: Diagnosis not present

## 2018-02-17 LAB — CBC WITH DIFFERENTIAL/PLATELET
Abs Immature Granulocytes: 0.02 10*3/uL (ref 0.00–0.07)
BASOS ABS: 0 10*3/uL (ref 0.0–0.1)
Basophils Relative: 0 %
EOS ABS: 0 10*3/uL (ref 0.0–0.5)
EOS PCT: 0 %
HEMATOCRIT: 49.7 % (ref 39.0–52.0)
Hemoglobin: 16.2 g/dL (ref 13.0–17.0)
IMMATURE GRANULOCYTES: 0 %
LYMPHS ABS: 0.6 10*3/uL — AB (ref 0.7–4.0)
Lymphocytes Relative: 7 %
MCH: 28 pg (ref 26.0–34.0)
MCHC: 32.6 g/dL (ref 30.0–36.0)
MCV: 86 fL (ref 80.0–100.0)
Monocytes Absolute: 0.1 10*3/uL (ref 0.1–1.0)
Monocytes Relative: 2 %
NEUTROS PCT: 91 %
NRBC: 0 % (ref 0.0–0.2)
Neutro Abs: 7.4 10*3/uL (ref 1.7–7.7)
Platelets: 170 10*3/uL (ref 150–400)
RBC: 5.78 MIL/uL (ref 4.22–5.81)
RDW: 12.3 % (ref 11.5–15.5)
WBC: 8.2 10*3/uL (ref 4.0–10.5)

## 2018-02-17 MED ORDER — PREDNISONE 10 MG PO TABS: 10.0000 mg | ORAL_TABLET | Freq: Every day | ORAL | 0 refills | Status: DC

## 2018-02-17 MED ORDER — PANTOPRAZOLE SODIUM 20 MG PO TBEC: 20.0000 mg | DELAYED_RELEASE_TABLET | Freq: Every day | ORAL | 1 refills | Status: DC

## 2018-02-17 NOTE — Progress Notes (Signed)
Here w group home staff member Jacquelyne BalintWendy P. Per pt" im doing good- no pain no voiced / noted c/o

## 2018-02-19 IMAGING — CT CT BIOPSY AND ASPIRATION BONE MARROW
1 of 2 series · 10 of 14 positions shown, 13 images · non-contrast
Comparison: none

INDICATION: 25-year-old with leukopenia and thrombocytopenia.

[Series 2: i-spiral 5.0 b30f · axial · 0.75mm/px · z∈[+868,+986]mm · 10 of 42 slices shown, 13 images]
[im 4/42  soft-tissue]
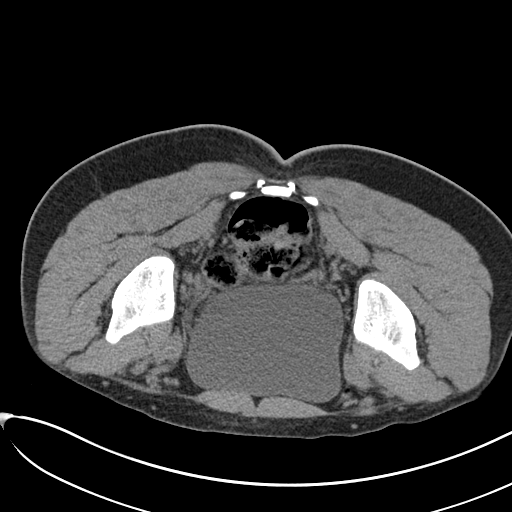
[im 4/42  bone]
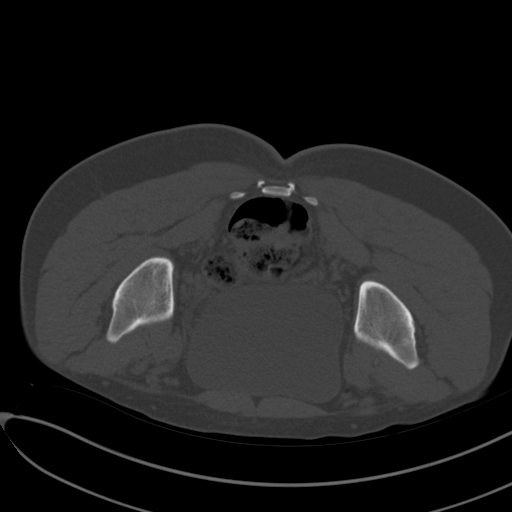
[im 8/42  bone]
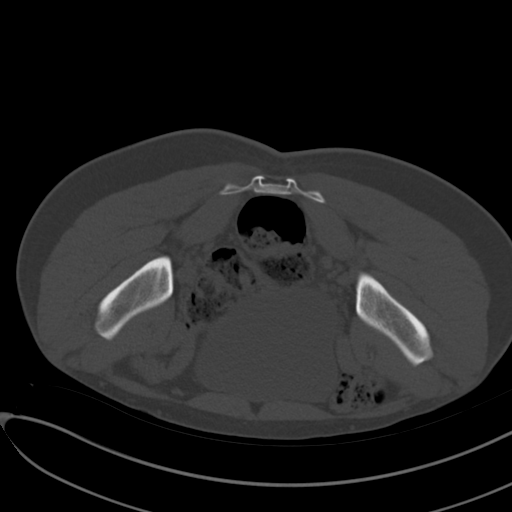
[im 12/42  bone]
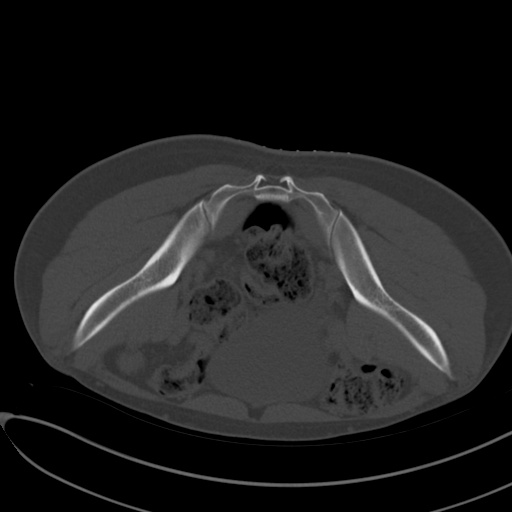
[im 15/42  bone]
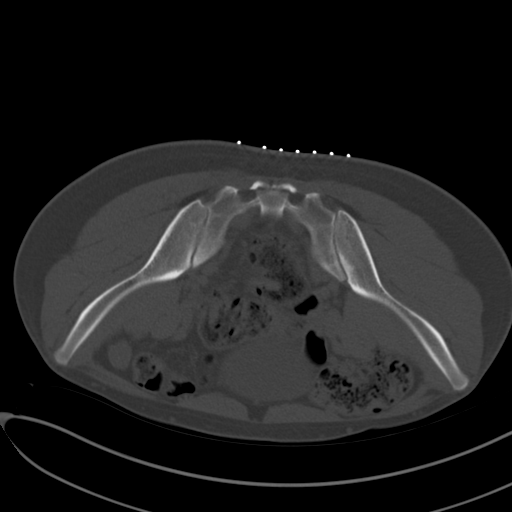
[im 19/42  soft-tissue]
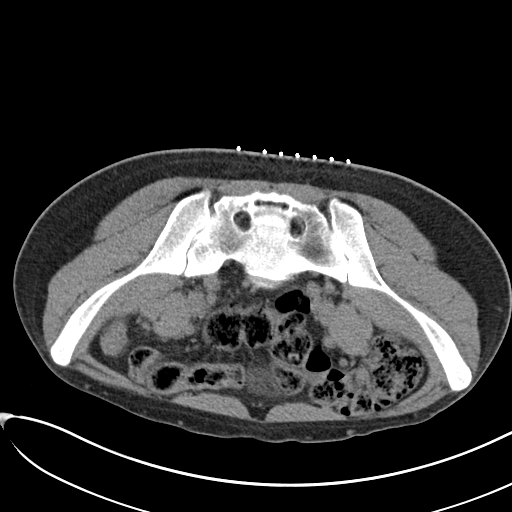
[im 19/42  bone]
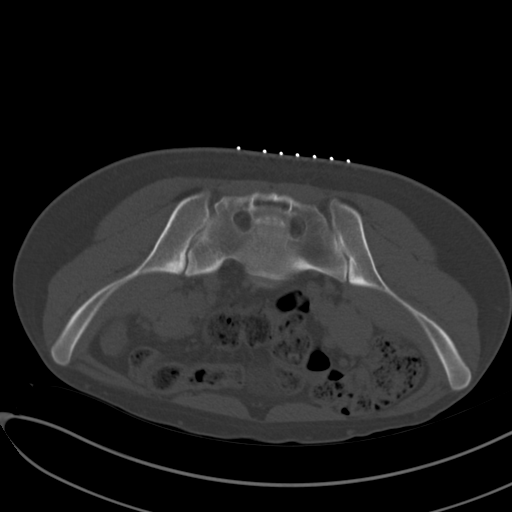
[im 23/42  bone]
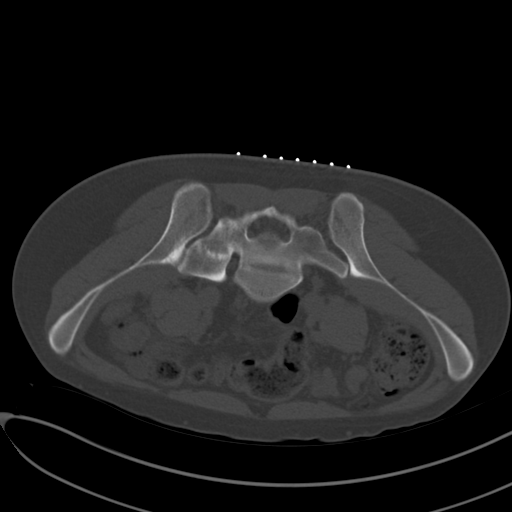
[im 27/42  bone]
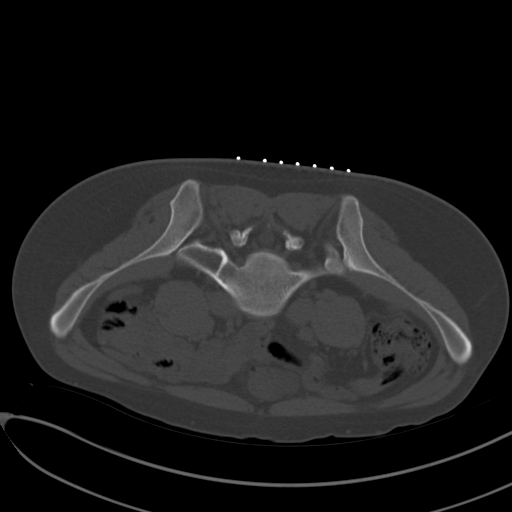
[im 30/42  bone]
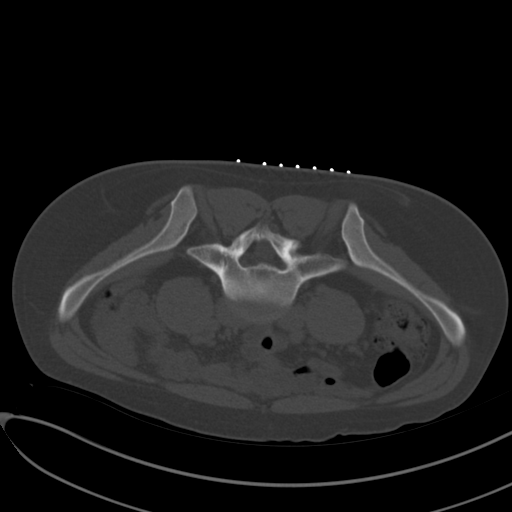
[im 34/42  soft-tissue]
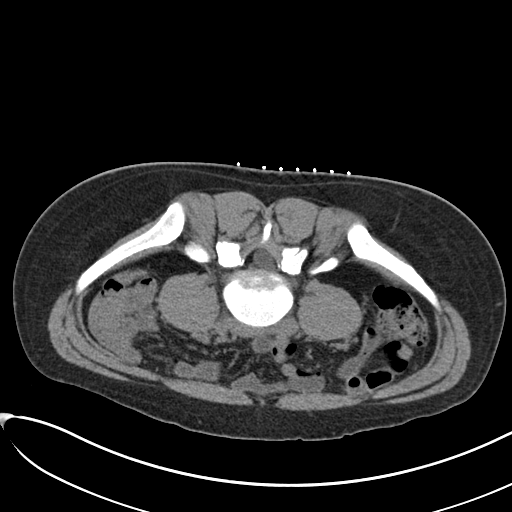
[im 34/42  bone]
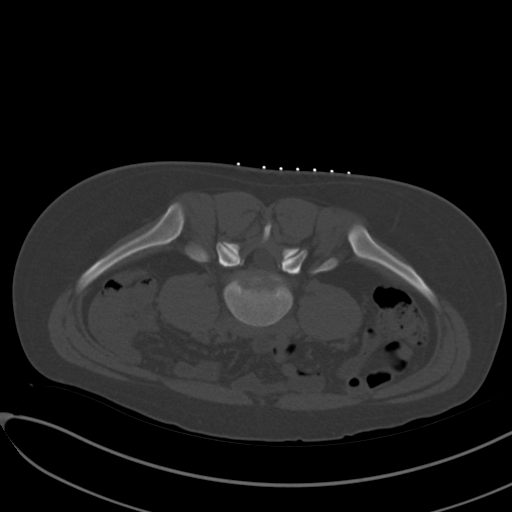
[im 38/42  bone]
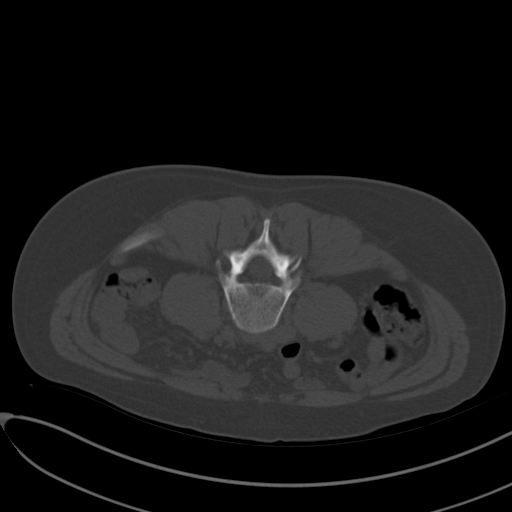

[10 of 14 positions shown; findings below may reference images not displayed]

EXAM:
CT GUIDED BONE MARROW ASPIRATES AND BIOPSY

MEDICATIONS:
None.

ANESTHESIA/SEDATION:
Fentanyl 75 mcg IV; Versed 2.0 mg IV

Moderate Sedation Time:  22 minutes

The patient was continuously monitored during the procedure by the
interventional radiology nurse under my direct supervision.

COMPLICATIONS:
None immediate.

PROCEDURE:
The procedure was explained to the patient. The risks and benefits
of the procedure were discussed and the patient's questions were
addressed. Informed consent was obtained from the patient and his
Klerant Roena. The patient was placed prone on CT scan. Images of
the pelvis were obtained. Maximal barrier sterile technique was
utilized including mask, sterile gowns, sterile gloves, sterile
drape, hand hygiene and skin antiseptic. The right side of back was
prepped and draped in sterile fashion. The skin and right posterior
iliac bone were anesthetized with 1% lidocaine. 11 gauge bone needle
was directed into the right iliac bone with CT guidance. Two
aspirates and one core biopsy obtained. Bandage placed over the
puncture site.
FINDINGS: Bone needle directed into the posterior right ilium.
IMPRESSION: CT guided bone marrow aspirates and core biopsy.

## 2018-02-20 ENCOUNTER — Encounter: Payer: Self-pay | Admitting: Oncology

## 2018-02-20 NOTE — Progress Notes (Signed)
Hematology/Oncology Consult note [facility]  Telephone:([phone] Fax:([phone]  Patient Care Team: [name], MD as PCP - General (Internal Medicine)   Name of the patient: Bobby Dean  [id number]  01-22-1991   Date of visit: 02/20/18  Diagnosis- autoimmune neutropenia and thrombocytopenia   Chief complaint/ Reason for visit- routine f/u of autoimmune pancytopenia  Heme/Onc history: Patient is a 27 year old male was then referred to [place] for evaluation and management of thrombocytopenia. He has a history of autism and intellectual visibility and currently lives in a group home for the last 7 months. His medical decision maker is [name] from [place] office.   Blood work from 04/28/2016 showed white count of 2.1 with an H&H of 15.5/45.1 and a platelet count of 51. CMP was within normal limits. TSH was normal at 1.09. At this time I do not have any prior CBCs for comparison.  Patient is here with his group home Asst. [name] today who reports that patient has been doing well over the last 7 months and has no known medical problems. He does not take any medications or over-the-counter medications. He has been feeling well and has had no sick days and denies any complaints.Patient is comfortable and is able to give Limited history and denies any complaints at this time his mother died of cancer (unknown which one). Other family history is not known.  Bone marrow biopsy on 05/14/2016 showed normal trilineage hematopoiesis with no evidence of lymphoma or leukemia. Peripheral flow cytometry showed reversal of CD4 to CD8 ratio. HIV hepatitis B and hepatitis C testing was negative. B12 and folate were within normal limits. PT/PTT INR was within normal limits.  Review of peripheral smear showed leukopenia and moderate neutropenia. Thrombocytopenia. RBC morphology is normal. Scattered reactive appearing lymphocytes are seen. No  blasts or early forms noted.  Monospot testing was positive. ANA was negative. Quantitative immunoglobulins were within normal limits. Serum copper was within normal limits. CMV DNA PCR negative  Patient was started on empiric high dose steroids for possible autoimmune etiology on 05/28/16. His counts normalized. Then his steroid taper was started. His WBC remained normal but his platelets began to decline 60's.   Plan was to restart full dose steroids and switch to rituxan second line after receiving pre splenectomy vaccines in anticipation of splenectomy should he not respond to rituxan  Patient has received all his pre splenectomy vaccines. He starts rituxan on 10/14/16. Seen at [facility] for second opinion by [name] who agrees with the plan.  [name] andfollowed byslow steroid taper. Steroids were stopped in September 2018  recurrent neutropenia/ TCP noted in sept 2019. rituxan and steroids restarted on 12/15/17   Interval history- feels well. Denies any complaints today. Appetite is good. Denies any nausea/vomiting or abdominal pain  ECOG PS- 0 Pain scale- 0 Opioid associated constipation- no  Review of systems- Review of Systems  Constitutional: Negative for chills, fever, malaise/fatigue and weight loss.  HENT: Negative for congestion, ear discharge and nosebleeds.   Eyes: Negative for blurred vision.  Respiratory: Negative for cough, hemoptysis, sputum production, shortness of breath and wheezing.   Cardiovascular: Negative for chest pain, palpitations, orthopnea and claudication.  Gastrointestinal: Negative for abdominal pain, blood in stool, constipation, diarrhea, heartburn, melena, nausea and vomiting.  Genitourinary: Negative for dysuria, flank pain, frequency, hematuria and urgency.  Musculoskeletal: Negative for back pain, joint pain and myalgias.  Skin: Negative for  rash.  Neurological: Negative for dizziness, tingling, focal  weakness, seizures, weakness and headaches.  Endo/Heme/Allergies: Does not bruise/bleed easily.  Psychiatric/Behavioral: Negative for depression and suicidal ideas. The patient does not have insomnia.       No Known Allergies   Past Medical History:  Diagnosis Date  . Anxiety   . Autism   . Autism   . 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  Social History   Socioeconomic History  . Marital status: Single    Spouse name: Not on file  . Number of children: Not on file  . Years of education: Not on file  . Highest education level: Not on file  Occupational History  . Not on file  Social Needs  . Financial resource strain: Not on file  . Food insecurity:    Worry: Not on file    Inability: Not on file  . Transportation needs:    Medical: Not on file    Non-medical: Not on file  Tobacco Use  . Smoking status: Never Smoker  . Smokeless tobacco: Never Used  Substance and Sexual Activity  . Alcohol use: No  . Drug use: No  . Sexual activity: Never  Lifestyle  . Physical activity:    Days per week: Not on file    Minutes per session: Not on file  . Stress: Not on file  Relationships  . Social connections:    Talks on phone: Not on file    Gets together: Not on file    Attends religious service: Not on file    Active member of club or organization: Not on file    Attends meetings of clubs or organizations: Not on file    Relationship status: Not on file  . Intimate partner violence:    Fear of current or ex partner: Not on file    Emotionally abused: Not on file    Physically abused: Not on file    Forced sexual activity: Not on file  Other Topics Concern  . Not on file  Social History Narrative  . Not on file    Family History  Problem Relation Age of Onset  . Cancer Mother      Current Outpatient Medications:  .  pantoprazole (PROTONIX) 20 MG tablet, Take 1 tablet (20 mg total) by mouth daily., Disp: 30 tablet, Rfl:  1 .  predniSONE (DELTASONE) 10 MG tablet, Take 1 tablet (10 mg total) by mouth daily with breakfast. 50 mg daily x 6 days, then 45 mg daily x  6 days, then 40 mg daily x 6 days, then 35 mg daily x 6 days, 30 mg daily x 6 days, then 25 mg daily x 6 days, then 20 mg daily x 6 days, 15 mg daily x days, then 10 mg daily x 6 days and then 5 mg daily x 6 days and then stop, Disp: 165 tablet, Rfl: 0  Physical exam:  Vitals:   02/17/18 1127  BP: (!) 148/88  Pulse: 96  Resp: 18  Temp: (!) 97 F (36.1 C)  TempSrc: Tympanic  Weight: 183 lb (83 kg)   Physical Exam  Constitutional: He is oriented to person, place, and time. He appears well-developed and well-nourished.  HENT:  Head: Normocephalic and atraumatic.  Mouth/Throat: Oropharynx is clear and moist.  Eyes: Pupils are equal, round, and reactive to light. EOM are normal.  Neck: Normal range of motion.  Cardiovascular: Normal rate, regular rhythm  and normal heart sounds.  Pulmonary/Chest: Effort normal and breath sounds normal.  Abdominal: Soft. Bowel sounds are normal.  Neurological: He is alert and oriented to person, place, and time.  Skin: Skin is warm and dry.     CMP Latest Ref Rng & Units 01/12/2018  Glucose 70 - 99 mg/dL 108(H)  BUN 6 - 20 mg/dL 14  Creatinine 0.61 - 1.24 mg/dL 0.69  Sodium 135 - 145 mmol/L 139  Potassium 3.5 - 5.1 mmol/L 4.0  Chloride 98 - 111 mmol/L 105  CO2 22 - 32 mmol/L 27  Calcium 8.9 - 10.3 mg/dL 9.3  Total Protein 6.5 - 8.1 g/dL 7.4  Total Bilirubin 0.3 - 1.2 mg/dL 1.0  Alkaline Phos 38 - 126 U/L 58  AST 15 - 41 U/L 16  ALT 0 - 44 U/L 16   CBC Latest Ref Rng & Units 02/17/2018  WBC 4.0 - 10.5 K/uL 8.2  Hemoglobin 13.0 - 17.0 g/dL 16.2  Hematocrit 39.0 - 52.0 % 49.7  Platelets 150 - 400 K/uL 170      Assessment and plan- Patient is a 27 y.o. 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  He developed recurrent  neutropenia and thrombocytopenia a year later and is status post 4 cycles of weekly Rituxan. He became neutropenic again when steroids were tapered. He was restarted on 50 mg  prednisone.   His neutropenia has resolved after restarting steroids. I will do a more slow steroid taper this time over 2 months starting at 50 mg for 6 days and decreasing by 5 mg Q6 days. If his counts drop after steroid taper I will rechallenge him with one more round to rituxan +/- IVIG before considering splenectomy  Pantoprazole started for GI prophylaxis.    Visit Diagnosis 1. 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, MD, MPH [facility] [phone] 02/20/2018 10:18 AM

## 2018-03-02 ENCOUNTER — Ambulatory Visit: Payer: Medicare Other | Admitting: Oncology

## 2018-03-02 ENCOUNTER — Other Ambulatory Visit: Payer: Medicare Other

## 2018-03-03 ENCOUNTER — Inpatient Hospital Stay: Payer: Medicare Other

## 2018-03-03 DIAGNOSIS — D61818 Other pancytopenia: Secondary | ICD-10-CM | POA: Diagnosis not present

## 2018-03-03 DIAGNOSIS — Z9081 Acquired absence of spleen: Secondary | ICD-10-CM | POA: Diagnosis not present

## 2018-03-03 LAB — CBC WITH DIFFERENTIAL/PLATELET
Abs Immature Granulocytes: 0.01 10*3/uL (ref 0.00–0.07)
BASOS ABS: 0 10*3/uL (ref 0.0–0.1)
Basophils Relative: 0 %
EOS ABS: 0 10*3/uL (ref 0.0–0.5)
Eosinophils Relative: 0 %
HEMATOCRIT: 49.1 % (ref 39.0–52.0)
Hemoglobin: 16.4 g/dL (ref 13.0–17.0)
IMMATURE GRANULOCYTES: 0 %
Lymphocytes Relative: 9 %
Lymphs Abs: 0.7 10*3/uL (ref 0.7–4.0)
MCH: 28.1 pg (ref 26.0–34.0)
MCHC: 33.4 g/dL (ref 30.0–36.0)
MCV: 84.1 fL (ref 80.0–100.0)
Monocytes Absolute: 0.2 10*3/uL (ref 0.1–1.0)
Monocytes Relative: 3 %
NEUTROS PCT: 88 %
NRBC: 0 % (ref 0.0–0.2)
Neutro Abs: 6.2 10*3/uL (ref 1.7–7.7)
PLATELETS: 165 10*3/uL (ref 150–400)
RBC: 5.84 MIL/uL — AB (ref 4.22–5.81)
RDW: 12.5 % (ref 11.5–15.5)
WBC: 7.1 10*3/uL (ref 4.0–10.5)

## 2018-03-14 ENCOUNTER — Other Ambulatory Visit: Payer: Self-pay | Admitting: Oncology

## 2018-03-20 ENCOUNTER — Inpatient Hospital Stay: Payer: Medicare Other

## 2018-03-20 ENCOUNTER — Encounter: Payer: Self-pay | Admitting: Oncology

## 2018-03-20 ENCOUNTER — Inpatient Hospital Stay: Payer: Medicare Other | Attending: Oncology | Admitting: Oncology

## 2018-03-20 ENCOUNTER — Other Ambulatory Visit: Payer: Self-pay

## 2018-03-20 VITALS — BP 124/77 | HR 70 | Temp 96.4°F | Resp 18 | Wt 179.5 lb

## 2018-03-20 DIAGNOSIS — D61818 Other pancytopenia: Secondary | ICD-10-CM | POA: Diagnosis not present

## 2018-03-20 LAB — CBC WITH DIFFERENTIAL/PLATELET
Abs Immature Granulocytes: 0.01 10*3/uL (ref 0.00–0.07)
Basophils Absolute: 0 10*3/uL (ref 0.0–0.1)
Basophils Relative: 0 %
Eosinophils Absolute: 0 10*3/uL (ref 0.0–0.5)
Eosinophils Relative: 0 %
HCT: 44.8 % (ref 39.0–52.0)
Hemoglobin: 15.2 g/dL (ref 13.0–17.0)
Immature Granulocytes: 0 %
Lymphocytes Relative: 10 %
Lymphs Abs: 0.7 10*3/uL (ref 0.7–4.0)
MCH: 28.4 pg (ref 26.0–34.0)
MCHC: 33.9 g/dL (ref 30.0–36.0)
MCV: 83.7 fL (ref 80.0–100.0)
Monocytes Absolute: 0.4 10*3/uL (ref 0.1–1.0)
Monocytes Relative: 5 %
Neutro Abs: 6.2 10*3/uL (ref 1.7–7.7)
Neutrophils Relative %: 85 %
Platelets: 163 10*3/uL (ref 150–400)
RBC: 5.35 MIL/uL (ref 4.22–5.81)
RDW: 13.2 % (ref 11.5–15.5)
WBC: 7.3 10*3/uL (ref 4.0–10.5)
nRBC: 0 % (ref 0.0–0.2)

## 2018-03-20 MED ORDER — PANTOPRAZOLE SODIUM 20 MG PO TBEC: 20.0000 mg | DELAYED_RELEASE_TABLET | Freq: Every day | ORAL | 0 refills | Status: DC

## 2018-03-20 NOTE — Progress Notes (Signed)
Hematology/Oncology Consult note [facility]  [phone] [phone]  Patient Care Team: [name], MD as PCP - General (Internal Medicine)   Name of the patient: Bobby Dean  [id number]  1990-11-28   Date of visit: 03/20/18  Diagnosis-  autoimmune neutropenia and thrombocytopenia  Chief complaint/ Reason for visit-routine follow-up of autoimmune pancytopenia  Heme/Onc history: Patient is a 28 year old male was then referred to [place] for evaluation and management of thrombocytopenia. He has a history of autism and intellectual visibility and currently lives in a group home for the last 7 months. His medical decision maker is [name] from [place] office.   Blood work from 04/28/2016 showed white count of 2.1 with an H&H of 15.5/45.1 and a platelet count of 51. CMP was within normal limits. TSH was normal at 1.09. At this time I do not have any prior CBCs for comparison.  Bone marrow biopsy on 05/14/2016 showed normal trilineage hematopoiesis with no evidence of lymphoma or leukemia. Peripheral flow cytometry showed reversal of CD4 to CD8 ratio. HIV hepatitis B and hepatitis C testing was negative. B12 and folate were within normal limits. PT/PTT INR was within normal limits.  Review of peripheral smear showed leukopenia and moderate neutropenia. Thrombocytopenia. RBC morphology is normal. Scattered reactive appearing lymphocytes are seen. No blasts or early forms noted.  Monospot testing was positive. ANA was negative. Quantitative immunoglobulins were within normal limits. Serum copper was within normal limits. CMV DNA PCR negative  Patient was started on empiric high dose steroids for possible autoimmune etiology on 05/28/16. His counts normalized. Then his steroid taper was started. His WBC remained normal but his platelets began to decline 60's.   Plan was to restart full dose steroids and switch to rituxan  second line after receiving pre splenectomy vaccines in anticipation of splenectomy should he not respond to rituxan  Patient has received all his pre splenectomy vaccines. He started rituxan on 10/14/16. Seen at [facility] for second opinion by [name] who agrees with the plan.  [name] andfollowed byslow steroid taper. Steroids were stopped in September 2018  recurrent neutropenia/ TCP noted in sept 2019. rituxan and steroids restarted on 12/15/17   Interval history-he feels well and denies any complaints today.  His weight is down by 4 pounds  ECOG PS- 0 Pain scale- 0 Opioid associated constipation- no  Review of systems- Review of Systems  Constitutional: Negative for chills, fever, malaise/fatigue and weight loss.  HENT: Negative for congestion, ear discharge and nosebleeds.   Eyes: Negative for blurred vision.  Respiratory: Negative for cough, hemoptysis, sputum production, shortness of breath and wheezing.   Cardiovascular: Negative for chest pain, palpitations, orthopnea and claudication.  Gastrointestinal: Negative for abdominal pain, blood in stool, constipation, diarrhea, heartburn, melena, nausea and vomiting.  Genitourinary: Negative for dysuria, flank pain, frequency, hematuria and urgency.  Musculoskeletal: Negative for back pain, joint pain and myalgias.  Skin: Negative for rash.  Neurological: Negative for dizziness, tingling, focal weakness, seizures, weakness and headaches.  Endo/Heme/Allergies: Does not bruise/bleed easily.  Psychiatric/Behavioral: Negative for depression and suicidal ideas. The patient does not have insomnia.        No Known Allergies   Past Medical History:  Diagnosis Date  . Anxiety   . Autism   . Autism   . 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  Social History   Socioeconomic History  .  Marital status: Single    Spouse name: Not on file  . Number of  children: Not on file  . Years of education: Not on file  . Highest education level: Not on file  Occupational History  . Not on file  Social Needs  . Financial resource strain: Not on file  . Food insecurity:    Worry: Not on file    Inability: Not on file  . Transportation needs:    Medical: Not on file    Non-medical: Not on file  Tobacco Use  . Smoking status: Never Smoker  . Smokeless tobacco: Never Used  Substance and Sexual Activity  . Alcohol use: No  . Drug use: No  . Sexual activity: Never  Lifestyle  . Physical activity:    Days per week: Not on file    Minutes per session: Not on file  . Stress: Not on file  Relationships  . Social connections:    Talks on phone: Not on file    Gets together: Not on file    Attends religious service: Not on file    Active member of club or organization: Not on file    Attends meetings of clubs or organizations: Not on file    Relationship status: Not on file  . Intimate partner violence:    Fear of current or ex partner: Not on file    Emotionally abused: Not on file    Physically abused: Not on file    Forced sexual activity: Not on file  Other Topics Concern  . Not on file  Social History Narrative  . Not on file    Family History  Problem Relation Age of Onset  . Cancer Mother      Current Outpatient Medications:  .  predniSONE (DELTASONE) 10 MG tablet, Take 1 tablet (10 mg total) by mouth daily with breakfast. 50 mg daily x 6 days, then 45 mg daily x  6 days, then 40 mg daily x 6 days, then 35 mg daily x 6 days, 30 mg daily x 6 days, then 25 mg daily x 6 days, then 20 mg daily x 6 days, 15 mg daily x days, then 10 mg daily x 6 days and then 5 mg daily x 6 days and then stop, Disp: 165 tablet, Rfl: 0 .  pantoprazole (PROTONIX) 20 MG tablet, Take 1 tablet (20 mg total) by mouth daily., Disp: 30 tablet, Rfl: 0  Physical exam:  Vitals:   03/20/18 1000  BP: 124/77  Pulse: 70  Resp: 18  Temp: (!) 96.4 F (35.8 C)   TempSrc: Tympanic  Weight: 179 lb 8 oz (81.4 kg)   Physical Exam Constitutional:      General: He is not in acute distress. HENT:     Head: Normocephalic and atraumatic.  Eyes:     Pupils: Pupils are equal, round, and reactive to light.  Neck:     Musculoskeletal: Normal range of motion.  Cardiovascular:     Rate and Rhythm: Normal rate and regular rhythm.     Heart sounds: Normal heart sounds.  Pulmonary:     Effort: Pulmonary effort is normal.     Breath sounds: Normal breath sounds.  Abdominal:     General: Bowel sounds are normal. There is no distension.     Palpations: Abdomen is soft.     Tenderness: There is no abdominal tenderness.  Skin:    General: Skin is warm and dry.  Neurological:  Mental Status: He is alert and oriented to person, place, and time.      CMP Latest Ref Rng & Units 01/12/2018  Glucose 70 - 99 mg/dL 108(H)  BUN 6 - 20 mg/dL 14  Creatinine 0.61 - 1.24 mg/dL 0.69  Sodium 135 - 145 mmol/L 139  Potassium 3.5 - 5.1 mmol/L 4.0  Chloride 98 - 111 mmol/L 105  CO2 22 - 32 mmol/L 27  Calcium 8.9 - 10.3 mg/dL 9.3  Total Protein 6.5 - 8.1 g/dL 7.4  Total Bilirubin 0.3 - 1.2 mg/dL 1.0  Alkaline Phos 38 - 126 U/L 58  AST 15 - 41 U/L 16  ALT 0 - 44 U/L 16   CBC Latest Ref Rng & Units 03/20/2018  WBC 4.0 - 10.5 K/uL 7.3  Hemoglobin 13.0 - 17.0 g/dL 15.2  Hematocrit 39.0 - 52.0 % 44.8  Platelets 150 - 400 K/uL 163     Assessment and plan- Patient is a 28 y.o. male with autoimmune pancytopenia here for routine follow-up  Patient continues to be on steroid taper and will be done with steroids over the next 1 week.  He did transiently drop his white count of 2.9 with an ANC of 0.8 when he came off steroids in November and was therefore started back on steroids followed by a slow taper.  I will continue to monitor his CBC with differential every 2 weeks and see him back in 2 months.  If he drops his counts again I will consider giving him a second round  of Rituxan plus minus IVIG   Visit Diagnosis 1. Autoimmune pancytopenia ([place])      [name], MD, MPH [facility] at [facility] [phone] 03/20/2018 12:38 PM

## 2018-03-20 NOTE — Progress Notes (Signed)
Here for follow up. Feeling " good " he stated  On prednisone taper now. ( needs ptotonix renewal -pended )  Weight down from ( 179.5 today )  4 lbs since 02/17/18

## 2018-03-30 ENCOUNTER — Ambulatory Visit: Payer: Medicare Other | Admitting: Oncology

## 2018-03-30 ENCOUNTER — Other Ambulatory Visit: Payer: Medicare Other

## 2018-04-03 ENCOUNTER — Inpatient Hospital Stay: Payer: Medicare Other

## 2018-04-03 DIAGNOSIS — D61818 Other pancytopenia: Secondary | ICD-10-CM | POA: Diagnosis not present

## 2018-04-03 LAB — CBC WITH DIFFERENTIAL/PLATELET
ABS IMMATURE GRANULOCYTES: 0.02 10*3/uL (ref 0.00–0.07)
Basophils Absolute: 0 10*3/uL (ref 0.0–0.1)
Basophils Relative: 0 %
Eosinophils Absolute: 0 10*3/uL (ref 0.0–0.5)
Eosinophils Relative: 0 %
HCT: 44.7 % (ref 39.0–52.0)
Hemoglobin: 14.7 g/dL (ref 13.0–17.0)
Immature Granulocytes: 0 %
Lymphocytes Relative: 9 %
Lymphs Abs: 0.8 10*3/uL (ref 0.7–4.0)
MCH: 27.9 pg (ref 26.0–34.0)
MCHC: 32.9 g/dL (ref 30.0–36.0)
MCV: 84.8 fL (ref 80.0–100.0)
Monocytes Absolute: 0.7 10*3/uL (ref 0.1–1.0)
Monocytes Relative: 8 %
NRBC: 0 % (ref 0.0–0.2)
Neutro Abs: 7.3 10*3/uL (ref 1.7–7.7)
Neutrophils Relative %: 83 %
Platelets: 146 10*3/uL — ABNORMAL LOW (ref 150–400)
RBC: 5.27 MIL/uL (ref 4.22–5.81)
RDW: 13.5 % (ref 11.5–15.5)
WBC: 8.9 10*3/uL (ref 4.0–10.5)

## 2018-04-17 ENCOUNTER — Inpatient Hospital Stay: Payer: Medicare Other | Attending: Oncology

## 2018-04-17 DIAGNOSIS — D61818 Other pancytopenia: Secondary | ICD-10-CM | POA: Diagnosis not present

## 2018-04-17 LAB — CBC WITH DIFFERENTIAL/PLATELET
Abs Immature Granulocytes: 0.01 10*3/uL (ref 0.00–0.07)
Basophils Absolute: 0 10*3/uL (ref 0.0–0.1)
Basophils Relative: 1 %
Eosinophils Absolute: 0.1 10*3/uL (ref 0.0–0.5)
Eosinophils Relative: 2 %
HCT: 41.7 % (ref 39.0–52.0)
Hemoglobin: 13.8 g/dL (ref 13.0–17.0)
Immature Granulocytes: 0 %
Lymphocytes Relative: 24 %
Lymphs Abs: 1 10*3/uL (ref 0.7–4.0)
MCH: 28 pg (ref 26.0–34.0)
MCHC: 33.1 g/dL (ref 30.0–36.0)
MCV: 84.6 fL (ref 80.0–100.0)
Monocytes Absolute: 0.6 10*3/uL (ref 0.1–1.0)
Monocytes Relative: 14 %
NEUTROS PCT: 59 %
Neutro Abs: 2.5 10*3/uL (ref 1.7–7.7)
PLATELETS: 170 10*3/uL (ref 150–400)
RBC: 4.93 MIL/uL (ref 4.22–5.81)
RDW: 13.9 % (ref 11.5–15.5)
WBC: 4.2 10*3/uL (ref 4.0–10.5)
nRBC: 0 % (ref 0.0–0.2)

## 2018-04-30 IMAGING — CT CT ABD-PELV W/ CM
3 of 8 series · 15 of 36 positions shown, 17 images · IV contrast (iopamidol)
Comparison: Abdomen ultrasound, 06/07/2016.

CLINICAL DATA: Staging autoimmune pancytopenia- leukopenia and
thrombocytopenia of unclear etiology.
TECHNIQUE: Multidetector CT imaging of the chest, abdomen and pelvis was
performed following the standard protocol during bolus
administration of intravenous contrast.

CONTRAST:  100mL 7OWJW2-9CC IOPAMIDOL (7OWJW2-9CC) INJECTION 61%

[Series 2: cap with · axial · 0.85mm/px · z∈[-840,-305]mm · 8 of 139 slices shown, 10 images]
[im 16/139  mediastinal]
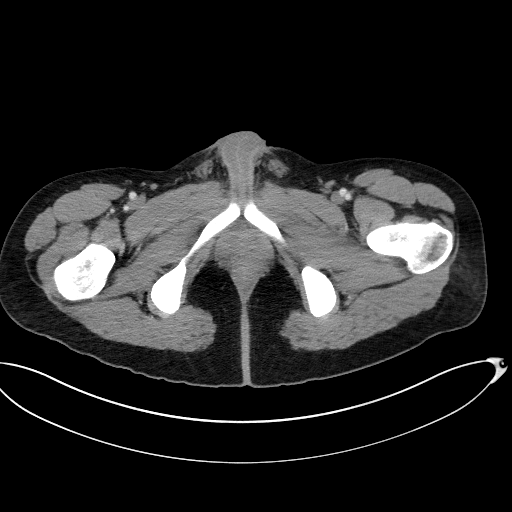
[im 16/139  lung]
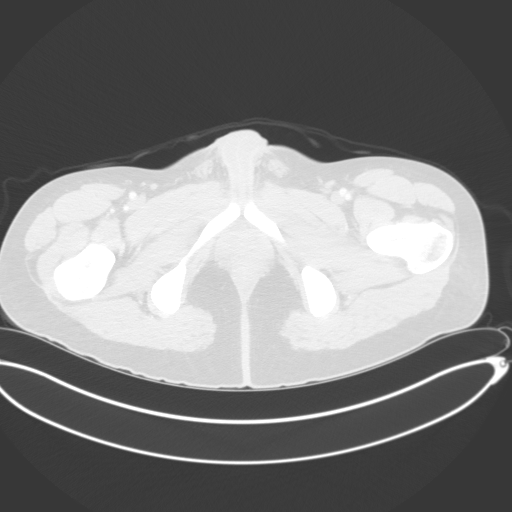
[im 31/139  lung]
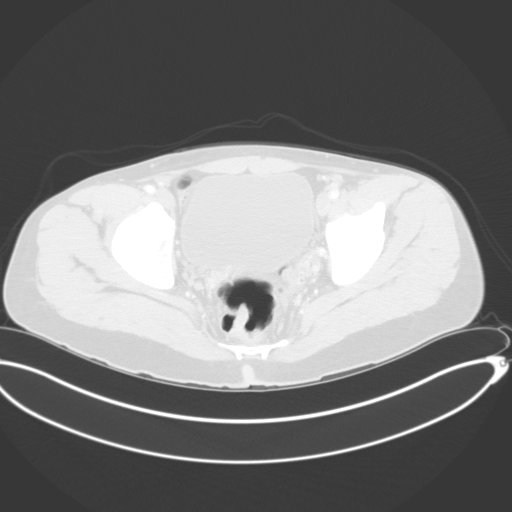
[im 47/139  lung]
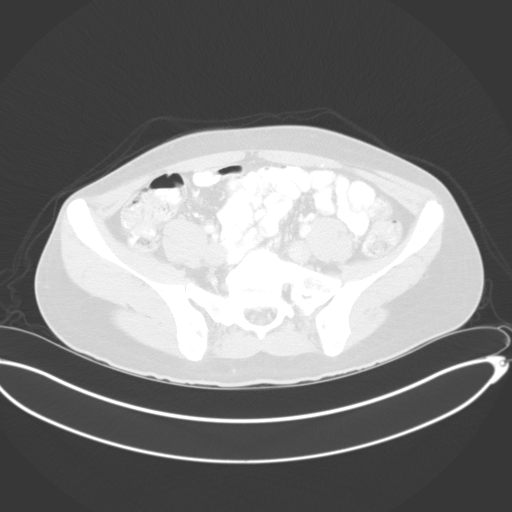
[im 62/139  lung]
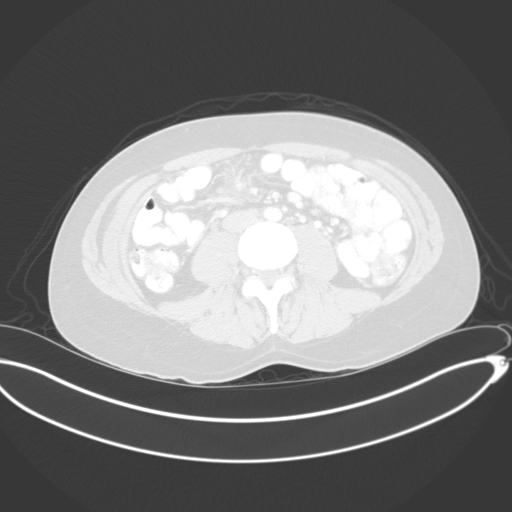
[im 77/139  mediastinal]
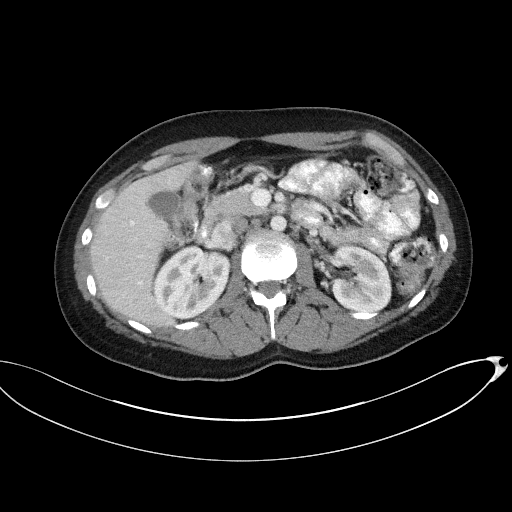
[im 77/139  lung]
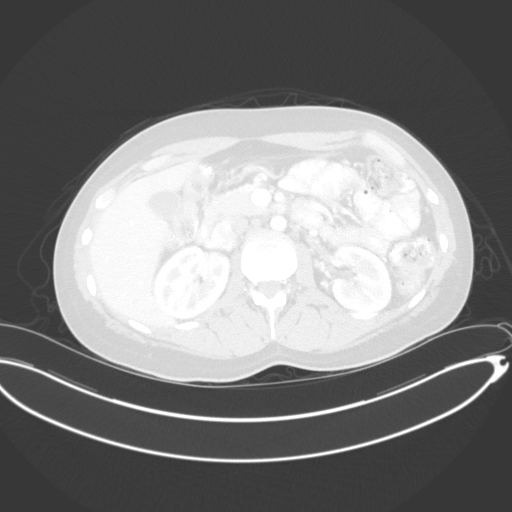
[im 93/139  lung]
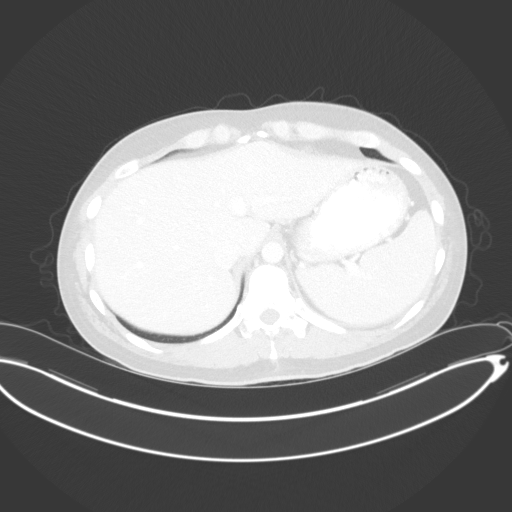
[im 108/139  lung]
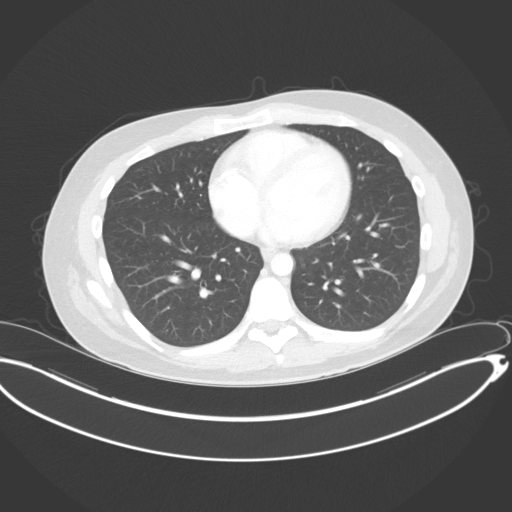
[im 123/139  lung]
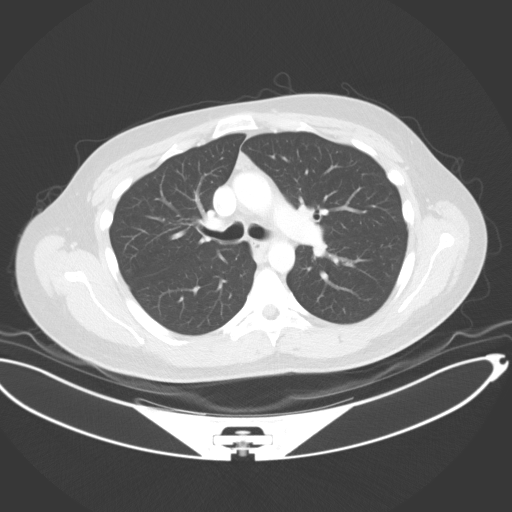

[Series 6: lung · axial · 0.85mm/px · z∈[-477,-309]mm · 6 of 140 slices shown]
[im 14/140  lung]
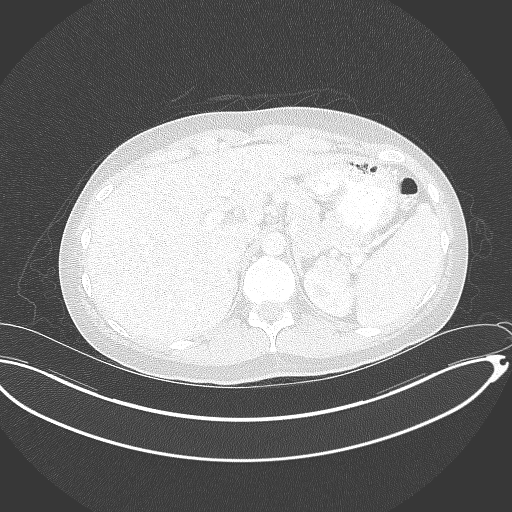
[im 28/140  lung]
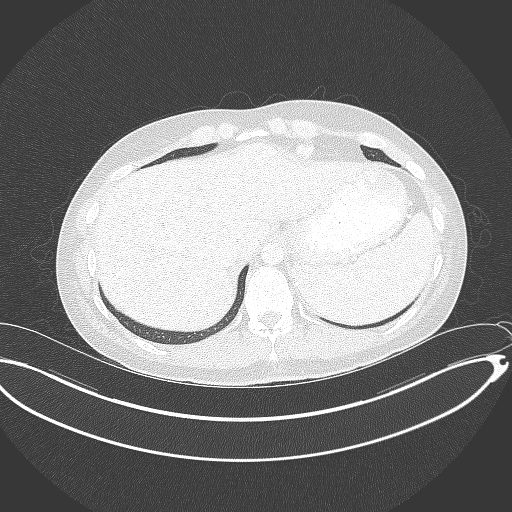
[im 42/140  lung]
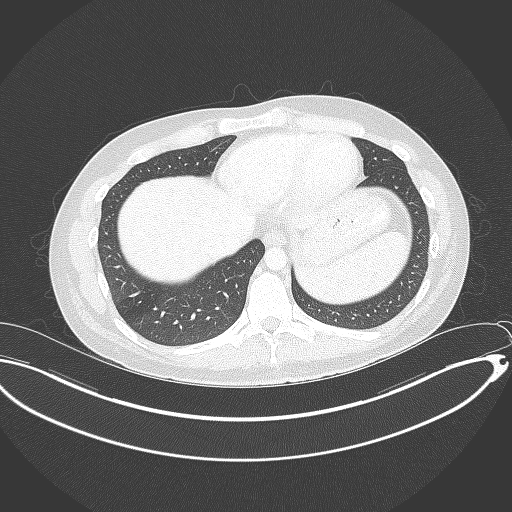
[im 56/140  lung]
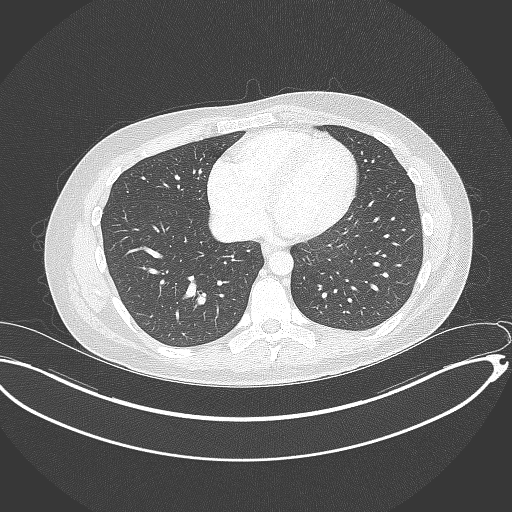
[im 84/140  lung]
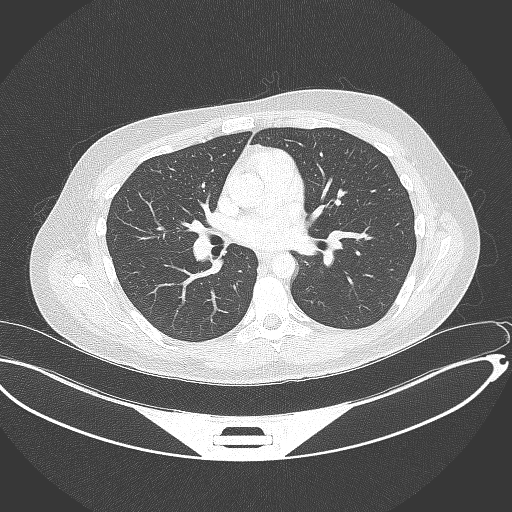
[im 98/140  lung]
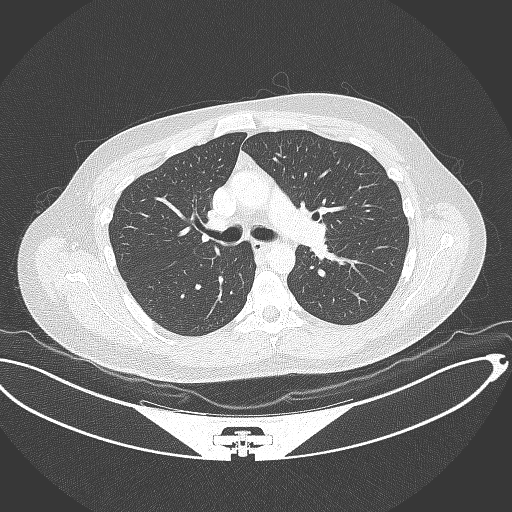

[Series 7: coronals · coronal · 0.83mm/px · 1 of 137 slices shown]
[im 69/137  lung]
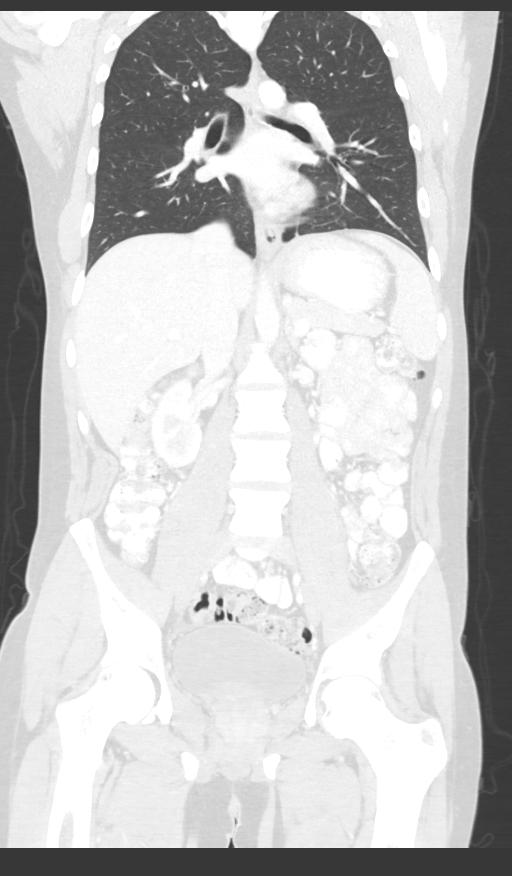

[15 of 36 positions shown; findings below may reference images not displayed]

Oncology notes: Patient is a 25-year-old male was then referred to
us for evaluation and management of thrombocytopenia. He has a
history of autism and intellectual visibility and currently lives in
[REDACTED] for the last 7 months. Blood work from 04/28/2016
showed white count of 2.1 with an H&H of 15.5/45.1 and a platelet
count of 51. CMP was within normal limits. TSH was normal at 1.09.

EXAM:
CT CHEST, ABDOMEN, AND PELVIS WITH CONTRAST
FINDINGS: CT CHEST FINDINGS

Cardiovascular: No significant vascular findings. Normal heart size.
No pericardial effusion. Great vessels are within normal limits.

Mediastinum/Nodes: No enlarged mediastinal, hilar, or axillary lymph
nodes. Thyroid gland, trachea, and esophagus demonstrate no
significant findings.

Lungs/Pleura: Lungs are clear. No pleural effusion or pneumothorax.

CT ABDOMEN PELVIS FINDINGS

Hepatobiliary: 6 mm low-density lesion in segment 7, nonspecific,
likely a cyst or small hemangioma. No other liver abnormality.
Normal gallbladder. No bile duct dilation.

Pancreas: Unremarkable. No pancreatic ductal dilatation or
surrounding inflammatory changes.

Spleen: Spleen is homogeneous in attenuation. No mass or focal
lesion. Spleen measures 13 x 6 x 12 cm with a calculated volume of
573 mL, within the normal range.

Adrenals/Urinary Tract: Adrenal glands are unremarkable. Kidneys are
normal, without renal calculi, focal lesion, or hydronephrosis.
Bladder is unremarkable.

Stomach/Bowel: Stomach is within normal limits. Appendix appears
normal. No evidence of bowel wall thickening, distention, or
inflammatory changes.

Vascular/Lymphatic: No significant vascular findings are present. No
enlarged abdominal or pelvic lymph nodes.

Reproductive: Prostate is unremarkable.

Other: No abdominal wall hernia or abnormality. No abdominopelvic
ascites.

MUSCULOSKELETAL FINDINGS

No acute or significant osseous findings.
IMPRESSION: 1. No acute findings within the chest, abdomen or pelvis.
2. No evidence of malignancy.  No adenopathy or splenomegaly.
3. 6 mm low-density liver lesion. In this patient's age group, this
can be considered benign based on the ACR consensus guidelines. No
additional imaging recommended.
4. No other abnormalities within the chest, abdomen or pelvis.

## 2018-05-01 ENCOUNTER — Inpatient Hospital Stay: Payer: Medicare Other

## 2018-05-01 DIAGNOSIS — D61818 Other pancytopenia: Secondary | ICD-10-CM

## 2018-05-01 LAB — CBC WITH DIFFERENTIAL/PLATELET
Abs Immature Granulocytes: 0 10*3/uL (ref 0.00–0.07)
Basophils Absolute: 0 10*3/uL (ref 0.0–0.1)
Basophils Relative: 1 %
Eosinophils Absolute: 0.1 10*3/uL (ref 0.0–0.5)
Eosinophils Relative: 3 %
HCT: 42.4 % (ref 39.0–52.0)
Hemoglobin: 13.9 g/dL (ref 13.0–17.0)
Immature Granulocytes: 0 %
Lymphocytes Relative: 31 %
Lymphs Abs: 1.2 10*3/uL (ref 0.7–4.0)
MCH: 28.7 pg (ref 26.0–34.0)
MCHC: 32.8 g/dL (ref 30.0–36.0)
MCV: 87.6 fL (ref 80.0–100.0)
MONO ABS: 0.5 10*3/uL (ref 0.1–1.0)
Monocytes Relative: 13 %
Neutro Abs: 2.1 10*3/uL (ref 1.7–7.7)
Neutrophils Relative %: 52 %
Platelets: 174 10*3/uL (ref 150–400)
RBC: 4.84 MIL/uL (ref 4.22–5.81)
RDW: 14 % (ref 11.5–15.5)
WBC: 4 10*3/uL (ref 4.0–10.5)
nRBC: 0 % (ref 0.0–0.2)

## 2018-05-10 ENCOUNTER — Other Ambulatory Visit: Payer: Self-pay | Admitting: Oncology

## 2018-05-15 ENCOUNTER — Inpatient Hospital Stay: Payer: Medicare Other | Attending: Oncology | Admitting: Oncology

## 2018-05-15 ENCOUNTER — Inpatient Hospital Stay: Payer: Medicare Other

## 2018-05-15 ENCOUNTER — Encounter: Payer: Self-pay | Admitting: Oncology

## 2018-05-15 ENCOUNTER — Other Ambulatory Visit: Payer: Self-pay

## 2018-05-15 VITALS — BP 125/76 | HR 72 | Temp 96.9°F | Wt 185.6 lb

## 2018-05-15 DIAGNOSIS — D61818 Other pancytopenia: Secondary | ICD-10-CM

## 2018-05-15 LAB — CBC WITH DIFFERENTIAL/PLATELET
Abs Immature Granulocytes: 0 10*3/uL (ref 0.00–0.07)
BASOS ABS: 0 10*3/uL (ref 0.0–0.1)
Basophils Relative: 1 %
Eosinophils Absolute: 0.2 10*3/uL (ref 0.0–0.5)
Eosinophils Relative: 4 %
HCT: 43.2 % (ref 39.0–52.0)
Hemoglobin: 14.2 g/dL (ref 13.0–17.0)
Immature Granulocytes: 0 %
Lymphocytes Relative: 37 %
Lymphs Abs: 1.5 10*3/uL (ref 0.7–4.0)
MCH: 28.6 pg (ref 26.0–34.0)
MCHC: 32.9 g/dL (ref 30.0–36.0)
MCV: 87.1 fL (ref 80.0–100.0)
Monocytes Absolute: 0.5 10*3/uL (ref 0.1–1.0)
Monocytes Relative: 12 %
NRBC: 0 % (ref 0.0–0.2)
Neutro Abs: 2 10*3/uL (ref 1.7–7.7)
Neutrophils Relative %: 46 %
Platelets: 167 10*3/uL (ref 150–400)
RBC: 4.96 MIL/uL (ref 4.22–5.81)
RDW: 13.6 % (ref 11.5–15.5)
WBC: 4.2 10*3/uL (ref 4.0–10.5)

## 2018-05-15 NOTE — Progress Notes (Signed)
Here for follow up. Per pt and per [facility] staff -pt doing well, feels " good -oh ya " per pt

## 2018-05-15 NOTE — Progress Notes (Signed)
Hematology/Oncology Consult note [facility]  [phone] [phone]  Patient Care Team: [name], MD as PCP - General (Internal Medicine)   Name of the patient: Bobby Dean  [id number]  [birth year]   Date of visit: 05/15/18  Diagnosis- autoimmune neutropenia and thrombocytopenia  Chief complaint/ Reason for visit-routine follow-up of autoimmune pancytopenia  Heme/Onc history: Patient is a 28 year old male was then referred to [place] for evaluation and management of thrombocytopenia. He has a history of autism and intellectual visibility and currently lives in a group home for the last 7 months. His medical decision maker is [name] from [place] office.   Blood work from 04/28/2016 showed white count of 2.1 with an H&H of 15.5/45.1 and a platelet count of 51. CMP was within normal limits. TSH was normal at 1.09. At this time I do not have any prior CBCs for comparison.  Bone marrow biopsy on 05/14/2016 showed normal trilineage hematopoiesis with no evidence of lymphoma or leukemia. Peripheral flow cytometry showed reversal of CD4 to CD8 ratio. HIV hepatitis B and hepatitis C testing was negative. B12 and folate were within normal limits. PT/PTT INR was within normal limits.  Review of peripheral smear showed leukopenia and moderate neutropenia. Thrombocytopenia. RBC morphology is normal. Scattered reactive appearing lymphocytes are seen. No blasts or early forms noted.  Monospot testing was positive. ANA was negative. Quantitative immunoglobulins were within normal limits. Serum copper was within normal limits. CMV DNA PCR negative  Patient was started on empiric high dose steroids for possible autoimmune etiology on 05/28/16. His counts normalized. Then his steroid taper was started. His WBC remained normal but his platelets began to decline 60's.   Plan was to restart full dose steroids and switch to rituxan  second line after receiving pre splenectomy vaccines in anticipation of splenectomy should he not respond to rituxan  Patient has received all his pre splenectomy vaccines. He started rituxan on 10/14/16. Seen at [facility] for second opinion by [name] who agrees with the plan.  [name] andfollowed byslow steroid taper. Steroids were stopped in September 2018  recurrent neutropenia/ TCP noted in sept 2019. rituxan and steroids restarted on 12/15/17   Interval history-feels well.  Denies any specific complaints today.  Appetite is good and he has gained weight.  Denies any recurrent infections hospitalizations, bleeding or bruising  ECOG PS- 0 Pain scale- 0   Review of systems- Review of Systems  Constitutional: Negative for chills, fever, malaise/fatigue and weight loss.  HENT: Negative for congestion, ear discharge and nosebleeds.   Eyes: Negative for blurred vision.  Respiratory: Negative for cough, hemoptysis, sputum production, shortness of breath and wheezing.   Cardiovascular: Negative for chest pain, palpitations, orthopnea and claudication.  Gastrointestinal: Negative for abdominal pain, blood in stool, constipation, diarrhea, heartburn, melena, nausea and vomiting.  Genitourinary: Negative for dysuria, flank pain, frequency, hematuria and urgency.  Musculoskeletal: Negative for back pain, joint pain and myalgias.  Skin: Negative for rash.  Neurological: Negative for dizziness, tingling, focal weakness, seizures, weakness and headaches.  Endo/Heme/Allergies: Does not bruise/bleed easily.  Psychiatric/Behavioral: Negative for depression and suicidal ideas. The patient does not have insomnia.       No Known Allergies   Past Medical History:  Diagnosis Date  . Anxiety   . Autism   . Autism   . 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  Social  History   Socioeconomic History  . Marital status:  Single    Spouse name: Not on file  . Number of children: Not on file  . Years of education: Not on file  . Highest education level: Not on file  Occupational History  . Not on file  Social Needs  . Financial resource strain: Not on file  . Food insecurity:    Worry: Not on file    Inability: Not on file  . Transportation needs:    Medical: Not on file    Non-medical: Not on file  Tobacco Use  . Smoking status: Never Smoker  . Smokeless tobacco: Never Used  Substance and Sexual Activity  . Alcohol use: No  . Drug use: No  . Sexual activity: Never  Lifestyle  . Physical activity:    Days per week: Not on file    Minutes per session: Not on file  . Stress: Not on file  Relationships  . Social connections:    Talks on phone: Not on file    Gets together: Not on file    Attends religious service: Not on file    Active member of club or organization: Not on file    Attends meetings of clubs or organizations: Not on file    Relationship status: Not on file  . Intimate partner violence:    Fear of current or ex partner: Not on file    Emotionally abused: Not on file    Physically abused: Not on file    Forced sexual activity: Not on file  Other Topics Concern  . Not on file  Social History Narrative  . Not on file    Family History  Problem Relation Age of Onset  . Cancer Mother      Current Outpatient Medications:  .  pantoprazole (PROTONIX) 20 MG tablet, TAKE 1 TABLET BY MOUTH ONCE DAILY. (Patient not taking: Reported on 05/15/2018), Disp: 30 tablet, Rfl: 0  Physical exam:  Vitals:   05/15/18 0948  BP: 125/76  Pulse: 72  Temp: (!) 96.9 F (36.1 C)  TempSrc: Tympanic  Weight: 185 lb 9.6 oz (84.2 kg)   Physical Exam Constitutional:      General: He is not in acute distress. HENT:     Head: Normocephalic and atraumatic.  Eyes:     Pupils: Pupils are equal, round, and reactive to light.  Neck:     Musculoskeletal: Normal range of motion.  Cardiovascular:      Rate and Rhythm: Normal rate and regular rhythm.     Heart sounds: Normal heart sounds.  Pulmonary:     Effort: Pulmonary effort is normal.     Breath sounds: Normal breath sounds.  Abdominal:     General: Bowel sounds are normal.     Palpations: Abdomen is soft.  Skin:    General: Skin is warm and dry.  Neurological:     Mental Status: He is alert and oriented to person, place, and time.      CMP Latest Ref Rng & Units 01/12/2018  Glucose 70 - 99 mg/dL 108(H)  BUN 6 - 20 mg/dL 14  Creatinine 0.61 - 1.24 mg/dL 0.69  Sodium 135 - 145 mmol/L 139  Potassium 3.5 - 5.1 mmol/L 4.0  Chloride 98 - 111 mmol/L 105  CO2 22 - 32 mmol/L 27  Calcium 8.9 - 10.3 mg/dL 9.3  Total Protein 6.5 - 8.1 g/dL 7.4  Total Bilirubin 0.3 - 1.2 mg/dL 1.0  Alkaline Phos  38 - 126 U/L 58  AST 15 - 41 U/L 16  ALT 0 - 44 U/L 16   CBC Latest Ref Rng & Units 05/15/2018  WBC 4.0 - 10.5 K/uL 4.2  Hemoglobin 13.0 - 17.0 g/dL 14.2  Hematocrit 39.0 - 52.0 % 43.2  Platelets 150 - 400 K/uL 167      Assessment and plan- Patient is a 28 y.o. male with autoimmune pancytopenia here for routine follow-up  Patient last received his 4 doses of weekly Rituxan in October 2019.  He transiently dropped his white count 2.9 with an ANC of 0.8 when he came off steroids in November and was started back on steroids followed by a slow taper.  He has been on prednisone taper and he came off prednisone in January 2020.  His counts have now normalized.  I will repeat CBC with differential in 1 month 2 months and 3 months and see him back in 3 months.   Visit Diagnosis 1. Autoimmune pancytopenia (HCC)      [name], MD, MPH [facility] at [facility] [phone] 05/15/2018 1:16 PM

## 2018-06-14 ENCOUNTER — Other Ambulatory Visit: Payer: Self-pay

## 2018-06-15 ENCOUNTER — Other Ambulatory Visit: Payer: Self-pay

## 2018-06-15 ENCOUNTER — Inpatient Hospital Stay: Payer: Medicare Other | Attending: Oncology

## 2018-06-15 DIAGNOSIS — D61818 Other pancytopenia: Secondary | ICD-10-CM | POA: Diagnosis not present

## 2018-06-15 LAB — CBC WITH DIFFERENTIAL/PLATELET
Abs Immature Granulocytes: 0 K/uL (ref 0.00–0.07)
Basophils Absolute: 0 K/uL (ref 0.0–0.1)
Basophils Relative: 1 %
Eosinophils Absolute: 0.1 K/uL (ref 0.0–0.5)
Eosinophils Relative: 2 %
HCT: 45.4 % (ref 39.0–52.0)
Hemoglobin: 15.6 g/dL (ref 13.0–17.0)
Immature Granulocytes: 0 %
Lymphocytes Relative: 41 %
Lymphs Abs: 1.7 K/uL (ref 0.7–4.0)
MCH: 29.2 pg (ref 26.0–34.0)
MCHC: 34.4 g/dL (ref 30.0–36.0)
MCV: 84.9 fL (ref 80.0–100.0)
Monocytes Absolute: 0.4 K/uL (ref 0.1–1.0)
Monocytes Relative: 9 %
Neutro Abs: 1.9 K/uL (ref 1.7–7.7)
Neutrophils Relative %: 47 %
Platelets: 145 K/uL — ABNORMAL LOW (ref 150–400)
RBC: 5.35 MIL/uL (ref 4.22–5.81)
RDW: 12 % (ref 11.5–15.5)
WBC: 4.1 K/uL (ref 4.0–10.5)
nRBC: 0 % (ref 0.0–0.2)

## 2018-06-30 ENCOUNTER — Other Ambulatory Visit: Payer: Self-pay | Admitting: Oncology

## 2018-07-16 ENCOUNTER — Other Ambulatory Visit: Payer: Self-pay

## 2018-07-17 ENCOUNTER — Other Ambulatory Visit: Payer: Self-pay

## 2018-07-17 ENCOUNTER — Inpatient Hospital Stay: Payer: Medicare Other | Attending: Oncology

## 2018-07-17 DIAGNOSIS — D61818 Other pancytopenia: Secondary | ICD-10-CM | POA: Insufficient documentation

## 2018-07-17 LAB — CBC WITH DIFFERENTIAL/PLATELET
Abs Immature Granulocytes: 0.01 10*3/uL (ref 0.00–0.07)
Basophils Absolute: 0 10*3/uL (ref 0.0–0.1)
Basophils Relative: 0 %
Eosinophils Absolute: 0.1 10*3/uL (ref 0.0–0.5)
Eosinophils Relative: 2 %
HCT: 45.3 % (ref 39.0–52.0)
Hemoglobin: 15.3 g/dL (ref 13.0–17.0)
Immature Granulocytes: 0 %
Lymphocytes Relative: 45 %
Lymphs Abs: 1.6 10*3/uL (ref 0.7–4.0)
MCH: 28.7 pg (ref 26.0–34.0)
MCHC: 33.8 g/dL (ref 30.0–36.0)
MCV: 85 fL (ref 80.0–100.0)
Monocytes Absolute: 0.4 10*3/uL (ref 0.1–1.0)
Monocytes Relative: 11 %
Neutro Abs: 1.5 10*3/uL — ABNORMAL LOW (ref 1.7–7.7)
Neutrophils Relative %: 42 %
Platelets: 141 10*3/uL — ABNORMAL LOW (ref 150–400)
RBC: 5.33 MIL/uL (ref 4.22–5.81)
RDW: 12.1 % (ref 11.5–15.5)
WBC: 3.6 10*3/uL — ABNORMAL LOW (ref 4.0–10.5)
nRBC: 0 % (ref 0.0–0.2)

## 2018-07-29 ENCOUNTER — Other Ambulatory Visit: Payer: Self-pay | Admitting: Oncology

## 2018-07-31 ENCOUNTER — Other Ambulatory Visit: Payer: Self-pay | Admitting: *Deleted

## 2018-07-31 NOTE — Telephone Encounter (Signed)
Patient is not on steroids and does not require pantoprazole

## 2018-08-21 ENCOUNTER — Ambulatory Visit: Payer: Medicare Other | Admitting: Oncology

## 2018-08-21 ENCOUNTER — Other Ambulatory Visit: Payer: Medicare Other

## 2018-08-22 ENCOUNTER — Inpatient Hospital Stay: Payer: Medicare Other | Attending: Oncology

## 2018-08-22 ENCOUNTER — Other Ambulatory Visit: Payer: Self-pay

## 2018-08-22 DIAGNOSIS — D61818 Other pancytopenia: Secondary | ICD-10-CM | POA: Insufficient documentation

## 2018-08-22 LAB — CBC WITH DIFFERENTIAL/PLATELET
Abs Immature Granulocytes: 0.01 10*3/uL (ref 0.00–0.07)
Basophils Absolute: 0 10*3/uL (ref 0.0–0.1)
Basophils Relative: 1 %
Eosinophils Absolute: 0.1 10*3/uL (ref 0.0–0.5)
Eosinophils Relative: 3 %
HCT: 42.5 % (ref 39.0–52.0)
Hemoglobin: 14.3 g/dL (ref 13.0–17.0)
Immature Granulocytes: 0 %
Lymphocytes Relative: 41 %
Lymphs Abs: 1.3 10*3/uL (ref 0.7–4.0)
MCH: 28.4 pg (ref 26.0–34.0)
MCHC: 33.6 g/dL (ref 30.0–36.0)
MCV: 84.3 fL (ref 80.0–100.0)
Monocytes Absolute: 0.3 10*3/uL (ref 0.1–1.0)
Monocytes Relative: 10 %
Neutro Abs: 1.5 10*3/uL — ABNORMAL LOW (ref 1.7–7.7)
Neutrophils Relative %: 45 %
Platelets: 135 10*3/uL — ABNORMAL LOW (ref 150–400)
RBC: 5.04 MIL/uL (ref 4.22–5.81)
RDW: 12.2 % (ref 11.5–15.5)
WBC: 3.3 10*3/uL — ABNORMAL LOW (ref 4.0–10.5)
nRBC: 0 % (ref 0.0–0.2)

## 2018-08-23 ENCOUNTER — Other Ambulatory Visit: Payer: Self-pay

## 2018-08-24 ENCOUNTER — Inpatient Hospital Stay (HOSPITAL_BASED_OUTPATIENT_CLINIC_OR_DEPARTMENT_OTHER): Payer: Medicare Other | Admitting: Oncology

## 2018-08-24 ENCOUNTER — Encounter: Payer: Self-pay | Admitting: Oncology

## 2018-08-24 DIAGNOSIS — D61818 Other pancytopenia: Secondary | ICD-10-CM

## 2018-08-24 NOTE — Progress Notes (Signed)
I connected with Bobby Dean on 08/24/18 at  1:30 PM EDT by video enabled telemedicine visit and verified that I am speaking with the correct person using two identifiers.   I discussed the limitations, risks, security and privacy concerns of performing an evaluation and management service by telemedicine and the availability of in-person appointments. I also discussed with the patient that there may be a patient responsible charge related to this service. The patient expressed understanding and agreed to proceed.  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 He has a history of autism and intellectual visibility and currently lives in a group home for the last 7 months. His medical decision maker is [name] from [place] office.   Blood work from 04/28/2016 showed white count of 2.1 with an H&H of 15.5/45.1 and a platelet count of 51. CMP was within normal limits. TSH was normal at 1.09. At this time I do not have any prior CBCs for comparison.  Bone marrow biopsy on 05/14/2016 showed normal trilineage hematopoiesis with no evidence of lymphoma or leukemia. Peripheral flow cytometry showed reversal of CD4 to CD8 ratio. HIV hepatitis B and hepatitis C testing was negative. B12 and folate were within normal limits. PT/PTT INR was within normal limits.  Review of peripheral smear showed leukopenia and moderate neutropenia. Thrombocytopenia. RBC morphology is normal. Scattered reactive appearing lymphocytes are seen. No blasts or early forms noted.  Monospot testing was positive. ANA was negative. Quantitative immunoglobulins were within normal  limits. Serum copper was within normal limits. CMV DNA PCR negative  Patient was started on empiric high dose steroids for possible autoimmune etiology on 05/28/16. His counts normalized. Then his steroid taper was started. His WBC remained normal but his platelets began to decline 60's.   Plan was to restart full dose steroids and switch to rituxan second line after receiving pre splenectomy vaccines in anticipation of splenectomy should he not respond to rituxan  Patient has received all his pre splenectomy vaccines. He startedrituxan on 10/14/16. Seen at [facility] for second opinion by [name] who agrees with the plan.  [name] andfollowed byslow steroid taper. Steroids were stopped in September 2018  recurrent neutropenia/ TCP noted in sept 2019. rituxan and steroids restarted on 12/15/17 and counts normalized   Interval history: Overall patient is doing well.  His appetite is good and he denies any recurrent infections or hospitalizations.  Denies any drenching night sweats.  Weight has been stable   Review of Systems  Constitutional: Negative for chills, fever, malaise/fatigue and weight loss.  HENT: Negative for congestion, ear discharge and nosebleeds.   Eyes: Negative for blurred vision.  Respiratory: Negative for cough, hemoptysis, sputum production, shortness of breath and wheezing.   Cardiovascular: Negative for chest pain, palpitations, orthopnea and claudication.  Gastrointestinal: Negative for abdominal pain, blood in stool, constipation, diarrhea, heartburn, melena, nausea and vomiting.  Genitourinary: Negative for dysuria, flank pain, frequency, hematuria and urgency.  Musculoskeletal: Negative for back pain, joint pain and myalgias.  Skin: Negative for rash.  Neurological: Negative for dizziness, tingling, focal weakness, seizures, weakness and headaches.  Endo/Heme/Allergies: Does not bruise/bleed easily.   Psychiatric/Behavioral: Negative for  depression and suicidal ideas. The patient does not have insomnia.     No Known Allergies  Past Medical History:  Diagnosis Date  . Anxiety   . Autism   . Autism   . 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 No pertinent surgical history.  Social History   Socioeconomic History  . Marital status: Single    Spouse name: Not on file  . Number of children: Not on file  . Years of education: Not on file  . Highest education level: Not on file  Occupational History  . Not on file  Social Needs  . Financial resource strain: Not on file  . Food insecurity    Worry: Not on file    Inability: Not on file  . Transportation needs    Medical: Not on file    Non-medical: Not on file  Tobacco Use  . Smoking status: Never Smoker  . Smokeless tobacco: Never Used  Substance and Sexual Activity  . Alcohol use: No  . Drug use: No  . Sexual activity: Never  Lifestyle  . Physical activity    Days per week: Not on file    Minutes per session: Not on file  . Stress: Not on file  Relationships  . Social Herbalist on phone: Not on file    Gets together: Not on file    Attends religious service: Not on file    Active member of club or organization: Not on file    Attends meetings of clubs or organizations: Not on file    Relationship status: Not on file  . Intimate partner violence    Fear of current or ex partner: Not on file    Emotionally abused: Not on file    Physically abused: Not on file    Forced sexual activity: Not on file  Other Topics Concern  . Not on file  Social History Narrative  . Not on file    Family History  Problem Relation Age of Onset  . Cancer Mother        CMP Latest Ref Rng & Units 01/12/2018  Glucose 70 - 99 mg/dL 108(H)  BUN 6 - 20 mg/dL 14  Creatinine 0.61 - 1.24 mg/dL 0.69  Sodium 135 - 145 mmol/L 139  Potassium 3.5 - 5.1 mmol/L 4.0  Chloride 98 - 111 mmol/L 105  CO2 22 - 32  mmol/L 27  Calcium 8.9 - 10.3 mg/dL 9.3  Total Protein 6.5 - 8.1 g/dL 7.4  Total Bilirubin 0.3 - 1.2 mg/dL 1.0  Alkaline Phos 38 - 126 U/L 58  AST 15 - 41 U/L 16  ALT 0 - 44 U/L 16   CBC Latest Ref Rng & Units 08/22/2018  WBC 4.0 - 10.5 K/uL 3.3(L)  Hemoglobin 13.0 - 17.0 g/dL 14.3  Hematocrit 39.0 - 52.0 % 42.5  Platelets 150 - 400 K/uL 135(L)     Observation/objective: Appears in no acute distress of a video visit today.  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  Currently his white count is again beginning to trend down with an [place] of 1.5 today.  Platelet counts are still mildly low at 135.  This is more or less the same as compared to his last CBC a month ago.  He will likely be needing steroids and Rituxan soon.  I will hold off on giving that at this time.    Follow-up instructions:Repeat CBC with differential in 1 month and 2 months and I will see him back in 2 months.  Patient knows to call us if he develops any worsening signs and symptoms of infection or B symptoms    I discussed the assessment and treatment plan with the patient. The patient was provided an opportunity to ask questions and all were answered. The patient agreed with the plan and demonstrated an understanding of the instructions.   The patient was advised to call back or seek an in-person evaluation if the symptoms worsen or if the condition fails to improve as anticipated.   Visit Diagnosis: 1. 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, MD, MPH [facility] at [facility] [phone] 08/24/2018 2:25 PM

## 2018-09-20 ENCOUNTER — Other Ambulatory Visit: Payer: Self-pay

## 2018-09-20 ENCOUNTER — Inpatient Hospital Stay: Payer: Medicare Other | Attending: Oncology

## 2018-09-20 DIAGNOSIS — D61818 Other pancytopenia: Secondary | ICD-10-CM | POA: Diagnosis not present

## 2018-09-20 LAB — CBC WITH DIFFERENTIAL/PLATELET
Abs Immature Granulocytes: 0.01 10*3/uL (ref 0.00–0.07)
Basophils Absolute: 0 10*3/uL (ref 0.0–0.1)
Basophils Relative: 0 %
Eosinophils Absolute: 0.1 10*3/uL (ref 0.0–0.5)
Eosinophils Relative: 2 %
HCT: 40.8 % (ref 39.0–52.0)
Hemoglobin: 13.9 g/dL (ref 13.0–17.0)
Immature Granulocytes: 0 %
Lymphocytes Relative: 38 %
Lymphs Abs: 1.3 10*3/uL (ref 0.7–4.0)
MCH: 28.8 pg (ref 26.0–34.0)
MCHC: 34.1 g/dL (ref 30.0–36.0)
MCV: 84.5 fL (ref 80.0–100.0)
Monocytes Absolute: 0.3 10*3/uL (ref 0.1–1.0)
Monocytes Relative: 9 %
Neutro Abs: 1.7 10*3/uL (ref 1.7–7.7)
Neutrophils Relative %: 51 %
Platelets: 106 10*3/uL — ABNORMAL LOW (ref 150–400)
RBC: 4.83 MIL/uL (ref 4.22–5.81)
RDW: 13.1 % (ref 11.5–15.5)
WBC: 3.5 10*3/uL — ABNORMAL LOW (ref 4.0–10.5)
nRBC: 0 % (ref 0.0–0.2)

## 2018-09-21 ENCOUNTER — Inpatient Hospital Stay: Payer: Medicare Other

## 2018-09-22 ENCOUNTER — Other Ambulatory Visit: Payer: Medicare Other

## 2018-10-23 ENCOUNTER — Telehealth: Payer: Self-pay | Admitting: *Deleted

## 2018-10-23 NOTE — Telephone Encounter (Signed)
Bobby Dean made appt and called [name]

## 2018-10-23 NOTE — Telephone Encounter (Signed)
I am ok with virtual appointment. He will need to come for labs a day prior. [facility] can you give him morning appointment on Thursday or Friday doximity? Thanks, Astrid Divine

## 2018-10-23 NOTE — Telephone Encounter (Signed)
Patient will need a lot of assistance while at the office, she is asking if his appointment could be virtual if she cannot be with him. Please advise

## 2018-10-24 ENCOUNTER — Other Ambulatory Visit: Payer: Self-pay | Admitting: *Deleted

## 2018-10-24 ENCOUNTER — Inpatient Hospital Stay: Payer: Medicare Other | Attending: Oncology

## 2018-10-24 ENCOUNTER — Other Ambulatory Visit: Payer: Self-pay

## 2018-10-24 ENCOUNTER — Inpatient Hospital Stay: Payer: Medicare Other | Admitting: Oncology

## 2018-10-24 ENCOUNTER — Inpatient Hospital Stay: Payer: Medicare Other

## 2018-10-24 ENCOUNTER — Telehealth: Payer: Self-pay | Admitting: *Deleted

## 2018-10-24 DIAGNOSIS — F419 Anxiety disorder, unspecified: Secondary | ICD-10-CM | POA: Diagnosis not present

## 2018-10-24 DIAGNOSIS — Z5112 Encounter for antineoplastic immunotherapy: Secondary | ICD-10-CM | POA: Insufficient documentation

## 2018-10-24 DIAGNOSIS — D61818 Other pancytopenia: Secondary | ICD-10-CM

## 2018-10-24 DIAGNOSIS — D709 Neutropenia, unspecified: Secondary | ICD-10-CM | POA: Diagnosis not present

## 2018-10-24 DIAGNOSIS — F840 Autistic disorder: Secondary | ICD-10-CM | POA: Insufficient documentation

## 2018-10-24 DIAGNOSIS — D696 Thrombocytopenia, unspecified: Secondary | ICD-10-CM | POA: Diagnosis not present

## 2018-10-24 LAB — CBC WITH DIFFERENTIAL/PLATELET
Abs Immature Granulocytes: 0 10*3/uL (ref 0.00–0.07)
Basophils Absolute: 0 10*3/uL (ref 0.0–0.1)
Basophils Relative: 1 %
Eosinophils Absolute: 0.1 10*3/uL (ref 0.0–0.5)
Eosinophils Relative: 4 %
HCT: 41.4 % (ref 39.0–52.0)
Hemoglobin: 14.1 g/dL (ref 13.0–17.0)
Immature Granulocytes: 0 %
Lymphocytes Relative: 40 %
Lymphs Abs: 1.3 10*3/uL (ref 0.7–4.0)
MCH: 28.9 pg (ref 26.0–34.0)
MCHC: 34.1 g/dL (ref 30.0–36.0)
MCV: 84.8 fL (ref 80.0–100.0)
Monocytes Absolute: 0.4 10*3/uL (ref 0.1–1.0)
Monocytes Relative: 12 %
Neutro Abs: 1.4 10*3/uL — ABNORMAL LOW (ref 1.7–7.7)
Neutrophils Relative %: 43 %
Platelets: 29 10*3/uL — CL (ref 150–400)
RBC: 4.88 MIL/uL (ref 4.22–5.81)
RDW: 12.5 % (ref 11.5–15.5)
WBC: 3.3 10*3/uL — ABNORMAL LOW (ref 4.0–10.5)
nRBC: 0 % (ref 0.0–0.2)

## 2018-10-24 MED ORDER — PANTOPRAZOLE SODIUM 20 MG PO TBEC: 20.0000 mg | DELAYED_RELEASE_TABLET | Freq: Every day | ORAL | 0 refills | Status: DC

## 2018-10-24 MED ORDER — SULFAMETHOXAZOLE-TRIMETHOPRIM 800-160 MG PO TABS: 1.0000 | ORAL_TABLET | ORAL | 0 refills | Status: DC

## 2018-10-24 MED ORDER — PREDNISONE 50 MG PO TABS: ORAL_TABLET | ORAL | 0 refills | Status: DC

## 2018-10-24 NOTE — Telephone Encounter (Signed)
[name] called me back and said Friday is fine for treatment.

## 2018-10-24 NOTE — Telephone Encounter (Signed)
Called to let [name] know that plt 29 and Dr. Janese Banks states that pt. Will need to go back on steroids, GI med for protectant from ulcer from taking steroid and the atb that he was on before 3 x week. I have sent it in to his pharmacy and I told her that Dr. Janese Banks wants him to start back on rituxan. Dr. Janese Banks wants to start Thursday but we do not have room. He can start on Friday. I spoke to wendy about the above except for what day we can start. I called wendy back and left her message that we can do Friday but not Thursday. Starting at 9 am. Will await her to call me back to see if I need to change it.

## 2018-10-26 ENCOUNTER — Inpatient Hospital Stay: Payer: Medicare Other | Admitting: Oncology

## 2018-10-26 ENCOUNTER — Other Ambulatory Visit: Payer: Self-pay

## 2018-10-27 ENCOUNTER — Encounter: Payer: Self-pay | Admitting: Oncology

## 2018-10-27 ENCOUNTER — Telehealth: Payer: Self-pay | Admitting: *Deleted

## 2018-10-27 ENCOUNTER — Inpatient Hospital Stay: Payer: Medicare Other

## 2018-10-27 ENCOUNTER — Other Ambulatory Visit: Payer: Self-pay

## 2018-10-27 ENCOUNTER — Inpatient Hospital Stay (HOSPITAL_BASED_OUTPATIENT_CLINIC_OR_DEPARTMENT_OTHER): Payer: Medicare Other | Admitting: Oncology

## 2018-10-27 VITALS — BP 121/83 | HR 76 | Resp 18

## 2018-10-27 VITALS — BP 113/76 | HR 66 | Temp 98.1°F | Resp 16 | Ht 74.0 in | Wt 167.1 lb

## 2018-10-27 DIAGNOSIS — Z5181 Encounter for therapeutic drug level monitoring: Secondary | ICD-10-CM

## 2018-10-27 DIAGNOSIS — Z79899 Other long term (current) drug therapy: Secondary | ICD-10-CM

## 2018-10-27 DIAGNOSIS — D708 Other neutropenia: Secondary | ICD-10-CM | POA: Diagnosis not present

## 2018-10-27 DIAGNOSIS — D61818 Other pancytopenia: Secondary | ICD-10-CM

## 2018-10-27 DIAGNOSIS — F419 Anxiety disorder, unspecified: Secondary | ICD-10-CM | POA: Diagnosis not present

## 2018-10-27 DIAGNOSIS — D693 Immune thrombocytopenic purpura: Secondary | ICD-10-CM | POA: Diagnosis not present

## 2018-10-27 DIAGNOSIS — F840 Autistic disorder: Secondary | ICD-10-CM | POA: Diagnosis not present

## 2018-10-27 DIAGNOSIS — D709 Neutropenia, unspecified: Secondary | ICD-10-CM | POA: Diagnosis not present

## 2018-10-27 DIAGNOSIS — D696 Thrombocytopenia, unspecified: Secondary | ICD-10-CM | POA: Diagnosis not present

## 2018-10-27 DIAGNOSIS — Z5112 Encounter for antineoplastic immunotherapy: Secondary | ICD-10-CM | POA: Diagnosis not present

## 2018-10-27 LAB — COMPREHENSIVE METABOLIC PANEL
ALT: 11 U/L (ref 0–44)
AST: 16 U/L (ref 15–41)
Albumin: 4.2 g/dL (ref 3.5–5.0)
Alkaline Phosphatase: 61 U/L (ref 38–126)
Anion gap: 6 (ref 5–15)
BUN: 13 mg/dL (ref 6–20)
CO2: 29 mmol/L (ref 22–32)
Calcium: 9.3 mg/dL (ref 8.9–10.3)
Chloride: 106 mmol/L (ref 98–111)
Creatinine, Ser: 0.65 mg/dL (ref 0.61–1.24)
GFR calc Af Amer: >60 mL/min (ref >60)
GFR calc non Af Amer: >60 mL/min (ref >60)
Glucose, Bld: 81 mg/dL (ref 70–99)
Potassium: 3.5 mmol/L (ref 3.5–5.1)
Sodium: 141 mmol/L (ref 135–145)
Total Bilirubin: 0.6 mg/dL (ref 0.3–1.2)
Total Protein: 6.6 g/dL (ref 6.5–8.1)

## 2018-10-27 MED ORDER — SODIUM CHLORIDE 0.9 % IV SOLN
Freq: Once | INTRAVENOUS | Status: AC
  Administered 2018-10-27: 10:00:00 via INTRAVENOUS
  Filled 2018-10-27: qty 250

## 2018-10-27 MED ORDER — DIPHENHYDRAMINE HCL 25 MG PO CAPS
50.0000 mg | ORAL_CAPSULE | Freq: Once | ORAL | Status: AC
  Administered 2018-10-27: 50 mg via ORAL
  Filled 2018-10-27: qty 2

## 2018-10-27 MED ORDER — ACETAMINOPHEN 325 MG PO TABS
650.0000 mg | ORAL_TABLET | Freq: Once | ORAL | Status: AC
  Administered 2018-10-27: 10:00:00 650 mg via ORAL
  Filled 2018-10-27: qty 2

## 2018-10-27 NOTE — Progress Notes (Signed)
Pt here to re initiate rituxan for the pt. Wbc and plt dropped again. Eating good, no compaints.

## 2018-10-27 NOTE — Progress Notes (Signed)
Hematology/Oncology Consult note [facility]  [phone] [phone]  Patient Care Team: [name], MD as PCP - General (Internal Medicine)   Name of the patient: Bobby Dean  [id number]  [birth year]   Date of visit: 10/27/18  Diagnosis-autoimmune neutropenia and thrombocytopenia  Chief complaint/ Reason for visit-on treatment assessment prior to cycle 1 of weekly Rituxan  Heme/Onc history: Patient is a 28 year old male was then referred to [place] for evaluation and management of thrombocytopenia. He has a history of autism and intellectual visibility and currently lives in a group home for the last 7 months. His medical decision maker is [name] from [place] office.   Blood work from 04/28/2016 showed white count of 2.1 with an H&H of 15.5/45.1 and a platelet count of 51. CMP was within normal limits. TSH was normal at 1.09. At this time I do not have any prior CBCs for comparison.  Bone marrow biopsy on 05/14/2016 showed normal trilineage hematopoiesis with no evidence of lymphoma or leukemia. Peripheral flow cytometry showed reversal of CD4 to CD8 ratio. HIV hepatitis B and hepatitis C testing was negative. B12 and folate were within normal limits. PT/PTT INR was within normal limits.  Review of peripheral smear showed leukopenia and moderate neutropenia. Thrombocytopenia. RBC morphology is normal. Scattered reactive appearing lymphocytes are seen. No blasts or early forms noted.  Monospot testing was positive. ANA was negative. Quantitative immunoglobulins were within normal limits. Serum copper was within normal limits. CMV DNA PCR negative  Patient was started on empiric high dose steroids for possible autoimmune etiology on 05/28/16. His counts normalized. Then his steroid taper was started. His WBC remained normal but his platelets began to decline 60's.   Plan was to restart full dose steroids and switch to  rituxan second line after receiving pre splenectomy vaccines in anticipation of splenectomy should he not respond to rituxan  Patient has received all his pre splenectomy vaccines. He startedrituxan on 10/14/16. Seen at [facility] for second opinion by [name] who agrees with the plan.  [name] andfollowed byslow steroid taper. Steroids were stopped in September 2018  recurrent neutropenia/ TCP noted in sept 2019. rituxan and steroids restarted on 12/15/17 and counts normalized   Interval history-overall he feels well and denies any complaints.  Denies any fatigue, fever, cough or shortness of breath  ECOG PS- 0 Pain scale- 0   Review of systems- Review of Systems  Constitutional: Negative for chills, fever, malaise/fatigue and weight loss.  HENT: Negative for congestion, ear discharge and nosebleeds.   Eyes: Negative for blurred vision.  Respiratory: Negative for cough, hemoptysis, sputum production, shortness of breath and wheezing.   Cardiovascular: Negative for chest pain, palpitations, orthopnea and claudication.  Gastrointestinal: Negative for abdominal pain, blood in stool, constipation, diarrhea, heartburn, melena, nausea and vomiting.  Genitourinary: Negative for dysuria, flank pain, frequency, hematuria and urgency.  Musculoskeletal: Negative for back pain, joint pain and myalgias.  Skin: Negative for rash.  Neurological: Negative for dizziness, tingling, focal weakness, seizures, weakness and headaches.  Endo/Heme/Allergies: Does not bruise/bleed easily.  Psychiatric/Behavioral: Negative for depression and suicidal ideas. The patient does not have insomnia.        No Known Allergies   Past Medical History:  Diagnosis Date  . Anxiety   . Autism   . Autism   . 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 No pertinent surgical history.  Social History  Socioeconomic History  . Marital status: Single    Spouse  name: Not on file  . Number of children: Not on file  . Years of education: Not on file  . Highest education level: Not on file  Occupational History  . Not on file  Social Needs  . Financial resource strain: Not on file  . Food insecurity    Worry: Not on file    Inability: Not on file  . Transportation needs    Medical: Not on file    Non-medical: Not on file  Tobacco Use  . Smoking status: Never Smoker  . Smokeless tobacco: Never Used  Substance and Sexual Activity  . Alcohol use: No  . Drug use: No  . Sexual activity: Never  Lifestyle  . Physical activity    Days per week: Not on file    Minutes per session: Not on file  . Stress: Not on file  Relationships  . Social Herbalist on phone: Not on file    Gets together: Not on file    Attends religious service: Not on file    Active member of club or organization: Not on file    Attends meetings of clubs or organizations: Not on file    Relationship status: Not on file  . Intimate partner violence    Fear of current or ex partner: Not on file    Emotionally abused: Not on file    Physically abused: Not on file    Forced sexual activity: Not on file  Other Topics Concern  . Not on file  Social History Narrative  . Not on file    Family History  Problem Relation Age of Onset  . Cancer Mother      Current Outpatient Medications:  .  pantoprazole (PROTONIX) 20 MG tablet, Take 1 tablet (20 mg total) by mouth daily., Disp: 21 tablet, Rfl: 0 .  predniSONE (DELTASONE) 50 MG tablet, 50 mg daily with food for 3 weeks, Disp: 21 tablet, Rfl: 0 .  sulfamethoxazole-trimethoprim (BACTRIM DS) 800-160 MG tablet, Take 1 tablet by mouth 3 (three) times a week., Disp: 9 tablet, Rfl: 0  Physical exam:  Vitals:   10/27/18 0901  BP: 113/76  Pulse: 66  Resp: 16  Temp: 98.1 F (36.7 C)  TempSrc: Tympanic  Weight: 167 lb 1.6 oz (75.8 kg)  Height: _0  (1.88 m)   Physical Exam Constitutional:      General: He is  not in acute distress. HENT:     Head: Normocephalic and atraumatic.     Mouth/Throat:     Mouth: Mucous membranes are moist.     Pharynx: Oropharynx is clear.  Eyes:     Pupils: Pupils are equal, round, and reactive to light.  Neck:     Musculoskeletal: Normal range of motion.  Cardiovascular:     Rate and Rhythm: Normal rate and regular rhythm.     Heart sounds: Normal heart sounds.  Pulmonary:     Effort: Pulmonary effort is normal.     Breath sounds: Normal breath sounds.  Abdominal:     General: Bowel sounds are normal.     Palpations: Abdomen is soft.  Skin:    General: Skin is warm and dry.  Neurological:     Mental Status: He is alert and oriented to person, place, and time.      CMP Latest Ref Rng & Units 10/27/2018  Glucose 70 - 99 mg/dL 81  BUN  6 - 20 mg/dL 13  Creatinine 0.61 - 1.24 mg/dL 0.65  Sodium 135 - 145 mmol/L 141  Potassium 3.5 - 5.1 mmol/L 3.5  Chloride 98 - 111 mmol/L 106  CO2 22 - 32 mmol/L 29  Calcium 8.9 - 10.3 mg/dL 9.3  Total Protein 6.5 - 8.1 g/dL 6.6  Total Bilirubin 0.3 - 1.2 mg/dL 0.6  Alkaline Phos 38 - 126 U/L 61  AST 15 - 41 U/L 16  ALT 0 - 44 U/L 11   CBC Latest Ref Rng & Units 10/24/2018  WBC 4.0 - 10.5 K/uL 3.3(L)  Hemoglobin 13.0 - 17.0 g/dL 14.1  Hematocrit 39.0 - 52.0 % 41.4  Platelets 150 - 400 K/uL 29(LL)     Assessment and plan- Patient is a 28 y.o. male with autoimmune neutropenia and thrombocytopenia responsive to steroids and Rituxan in the past now presenting with recurrent severe thrombocytopenia and mild neutropenia  Patient has been gradually requiring steroids and Rituxan once a year.  Currently his platelet counts are down to 29 and he has evidence of mild neutropenia with an ANC of 1.4.  I will therefore proceed with 4 weekly cycles of Rituxan starting today.  Patient will directly proceed for cycle 2 of Rituxan next week and I will see him back in 2 weeks for cycle 3.  I have also started him on 50 mg of  prednisone and he will remain on that dose for 3 weeks before I consider steroid taper.  He has responded well to this regimen in the past.  He will be on Protonix for GI prophylaxis and Bactrim for PCP prophylaxis.  Recommend no sports until platelets are greater than 50.  Fall precautions.  Neutropenic precautions reviewed and patient's caregiver will call us if there are any questions or concerns   Visit Diagnosis 1. 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 Visit for monitoring Rituxan therapy      [name], MD, MPH [facility] at [facility] [phone] 10/27/2018 9:43 AM

## 2018-10-27 NOTE — Telephone Encounter (Signed)
[name] the caregiver had asked me to call the new SW over his case. The previous of on ewas Daryl and he retired and now the person is [employer]. I did contact her at [phone] this am around 8 and speak with her. 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  I discussed the pt's over last 2 months and how it was trending down and now the plt took a drastic decrease and that is why Dr. Janese Banks wanted pt. To go back on the rituxan. This will be his third series of getting it weekly x 4 treatment. She gave verbal permission for this treatment to continue

## 2018-11-03 ENCOUNTER — Other Ambulatory Visit: Payer: Self-pay

## 2018-11-03 DIAGNOSIS — D708 Other neutropenia: Secondary | ICD-10-CM

## 2018-11-04 ENCOUNTER — Other Ambulatory Visit: Payer: Self-pay | Admitting: *Deleted

## 2018-11-04 DIAGNOSIS — D708 Other neutropenia: Secondary | ICD-10-CM

## 2018-11-06 ENCOUNTER — Other Ambulatory Visit: Payer: Self-pay

## 2018-11-06 ENCOUNTER — Inpatient Hospital Stay: Payer: Medicare Other

## 2018-11-06 ENCOUNTER — Other Ambulatory Visit: Payer: Self-pay | Admitting: Oncology

## 2018-11-06 VITALS — BP 144/93 | HR 79 | Resp 18

## 2018-11-06 DIAGNOSIS — D708 Other neutropenia: Secondary | ICD-10-CM

## 2018-11-06 DIAGNOSIS — F419 Anxiety disorder, unspecified: Secondary | ICD-10-CM | POA: Diagnosis not present

## 2018-11-06 DIAGNOSIS — Z5112 Encounter for antineoplastic immunotherapy: Secondary | ICD-10-CM | POA: Diagnosis not present

## 2018-11-06 DIAGNOSIS — D709 Neutropenia, unspecified: Secondary | ICD-10-CM | POA: Diagnosis not present

## 2018-11-06 DIAGNOSIS — F840 Autistic disorder: Secondary | ICD-10-CM | POA: Diagnosis not present

## 2018-11-06 DIAGNOSIS — D61818 Other pancytopenia: Secondary | ICD-10-CM

## 2018-11-06 DIAGNOSIS — D696 Thrombocytopenia, unspecified: Secondary | ICD-10-CM | POA: Diagnosis not present

## 2018-11-06 LAB — CBC WITH DIFFERENTIAL/PLATELET
Abs Immature Granulocytes: 0.01 10*3/uL (ref 0.00–0.07)
Basophils Absolute: 0 10*3/uL (ref 0.0–0.1)
Basophils Relative: 0 %
Eosinophils Absolute: 0 10*3/uL (ref 0.0–0.5)
Eosinophils Relative: 1 %
HCT: 43.4 % (ref 39.0–52.0)
Hemoglobin: 14.3 g/dL (ref 13.0–17.0)
Immature Granulocytes: 0 %
Lymphocytes Relative: 12 %
Lymphs Abs: 0.7 10*3/uL (ref 0.7–4.0)
MCH: 28.5 pg (ref 26.0–34.0)
MCHC: 32.9 g/dL (ref 30.0–36.0)
MCV: 86.5 fL (ref 80.0–100.0)
Monocytes Absolute: 0.3 10*3/uL (ref 0.1–1.0)
Monocytes Relative: 5 %
Neutro Abs: 4.9 10*3/uL (ref 1.7–7.7)
Neutrophils Relative %: 82 %
Platelets: 102 10*3/uL — ABNORMAL LOW (ref 150–400)
RBC: 5.02 MIL/uL (ref 4.22–5.81)
RDW: 12.7 % (ref 11.5–15.5)
WBC: 6 10*3/uL (ref 4.0–10.5)
nRBC: 0 % (ref 0.0–0.2)

## 2018-11-06 LAB — COMPREHENSIVE METABOLIC PANEL
ALT: 13 U/L (ref 0–44)
AST: 14 U/L — ABNORMAL LOW (ref 15–41)
Albumin: 4.3 g/dL (ref 3.5–5.0)
Alkaline Phosphatase: 50 U/L (ref 38–126)
Anion gap: 8 (ref 5–15)
BUN: 13 mg/dL (ref 6–20)
CO2: 29 mmol/L (ref 22–32)
Calcium: 9 mg/dL (ref 8.9–10.3)
Chloride: 103 mmol/L (ref 98–111)
Creatinine, Ser: 0.69 mg/dL (ref 0.61–1.24)
GFR calc Af Amer: >60 mL/min (ref >60)
GFR calc non Af Amer: >60 mL/min (ref >60)
Glucose, Bld: 68 mg/dL — ABNORMAL LOW (ref 70–99)
Potassium: 3.7 mmol/L (ref 3.5–5.1)
Sodium: 140 mmol/L (ref 135–145)
Total Bilirubin: 0.7 mg/dL (ref 0.3–1.2)
Total Protein: 6.8 g/dL (ref 6.5–8.1)

## 2018-11-06 MED ORDER — DIPHENHYDRAMINE HCL 25 MG PO CAPS
50.0000 mg | ORAL_CAPSULE | Freq: Once | ORAL | Status: AC
  Administered 2018-11-06: 50 mg via ORAL
  Filled 2018-11-06: qty 2

## 2018-11-06 MED ORDER — ACETAMINOPHEN 325 MG PO TABS
650.0000 mg | ORAL_TABLET | Freq: Once | ORAL | Status: AC
  Administered 2018-11-06: 650 mg via ORAL
  Filled 2018-11-06: qty 2

## 2018-11-06 MED ORDER — SODIUM CHLORIDE 0.9 % IV SOLN
Freq: Once | INTRAVENOUS | Status: AC
  Administered 2018-11-06: 10:00:00 via INTRAVENOUS
  Filled 2018-11-06: qty 170

## 2018-11-06 MED ORDER — SODIUM CHLORIDE 0.9 % IV SOLN
Freq: Once | INTRAVENOUS | Status: AC
  Administered 2018-11-06: 09:00:00 via INTRAVENOUS
  Filled 2018-11-06: qty 250

## 2018-11-13 ENCOUNTER — Other Ambulatory Visit: Payer: Self-pay

## 2018-11-13 ENCOUNTER — Inpatient Hospital Stay: Payer: Medicare Other

## 2018-11-13 ENCOUNTER — Encounter: Payer: Self-pay | Admitting: Oncology

## 2018-11-13 ENCOUNTER — Other Ambulatory Visit: Payer: Self-pay | Admitting: *Deleted

## 2018-11-13 ENCOUNTER — Inpatient Hospital Stay (HOSPITAL_BASED_OUTPATIENT_CLINIC_OR_DEPARTMENT_OTHER): Payer: Medicare Other | Admitting: Oncology

## 2018-11-13 VITALS — BP 125/75 | HR 71 | Temp 99.0°F | Resp 18

## 2018-11-13 VITALS — BP 121/82 | HR 67 | Temp 98.0°F | Resp 16 | Ht 74.0 in | Wt 165.6 lb

## 2018-11-13 DIAGNOSIS — D708 Other neutropenia: Secondary | ICD-10-CM

## 2018-11-13 DIAGNOSIS — Z79899 Other long term (current) drug therapy: Secondary | ICD-10-CM | POA: Diagnosis not present

## 2018-11-13 DIAGNOSIS — Z5112 Encounter for antineoplastic immunotherapy: Secondary | ICD-10-CM | POA: Diagnosis not present

## 2018-11-13 DIAGNOSIS — D61818 Other pancytopenia: Secondary | ICD-10-CM

## 2018-11-13 DIAGNOSIS — F419 Anxiety disorder, unspecified: Secondary | ICD-10-CM | POA: Diagnosis not present

## 2018-11-13 DIAGNOSIS — Z5181 Encounter for therapeutic drug level monitoring: Secondary | ICD-10-CM | POA: Diagnosis not present

## 2018-11-13 DIAGNOSIS — D709 Neutropenia, unspecified: Secondary | ICD-10-CM | POA: Diagnosis not present

## 2018-11-13 DIAGNOSIS — D696 Thrombocytopenia, unspecified: Secondary | ICD-10-CM | POA: Diagnosis not present

## 2018-11-13 DIAGNOSIS — F840 Autistic disorder: Secondary | ICD-10-CM | POA: Diagnosis not present

## 2018-11-13 LAB — CBC WITH DIFFERENTIAL/PLATELET
Abs Immature Granulocytes: 0 10*3/uL (ref 0.00–0.07)
Basophils Absolute: 0 10*3/uL (ref 0.0–0.1)
Basophils Relative: 0 %
Eosinophils Absolute: 0 10*3/uL (ref 0.0–0.5)
Eosinophils Relative: 0 %
HCT: 43.3 % (ref 39.0–52.0)
Hemoglobin: 14.4 g/dL (ref 13.0–17.0)
Immature Granulocytes: 0 %
Lymphocytes Relative: 22 %
Lymphs Abs: 1.1 10*3/uL (ref 0.7–4.0)
MCH: 29 pg (ref 26.0–34.0)
MCHC: 33.3 g/dL (ref 30.0–36.0)
MCV: 87.1 fL (ref 80.0–100.0)
Monocytes Absolute: 0.4 10*3/uL (ref 0.1–1.0)
Monocytes Relative: 8 %
Neutro Abs: 3.4 10*3/uL (ref 1.7–7.7)
Neutrophils Relative %: 70 %
Platelets: 139 10*3/uL — ABNORMAL LOW (ref 150–400)
RBC: 4.97 MIL/uL (ref 4.22–5.81)
RDW: 12.7 % (ref 11.5–15.5)
WBC: 5 10*3/uL (ref 4.0–10.5)
nRBC: 0 % (ref 0.0–0.2)

## 2018-11-13 LAB — COMPREHENSIVE METABOLIC PANEL
ALT: 12 U/L (ref 0–44)
AST: 13 U/L — ABNORMAL LOW (ref 15–41)
Albumin: 4.2 g/dL (ref 3.5–5.0)
Alkaline Phosphatase: 47 U/L (ref 38–126)
Anion gap: 7 (ref 5–15)
BUN: 17 mg/dL (ref 6–20)
CO2: 30 mmol/L (ref 22–32)
Calcium: 9.2 mg/dL (ref 8.9–10.3)
Chloride: 105 mmol/L (ref 98–111)
Creatinine, Ser: 0.75 mg/dL (ref 0.61–1.24)
GFR calc Af Amer: >60 mL/min (ref >60)
GFR calc non Af Amer: >60 mL/min (ref >60)
Glucose, Bld: 81 mg/dL (ref 70–99)
Potassium: 3.3 mmol/L — ABNORMAL LOW (ref 3.5–5.1)
Sodium: 142 mmol/L (ref 135–145)
Total Bilirubin: 0.7 mg/dL (ref 0.3–1.2)
Total Protein: 6.8 g/dL (ref 6.5–8.1)

## 2018-11-13 MED ORDER — SULFAMETHOXAZOLE-TRIMETHOPRIM 800-160 MG PO TABS: 1.0000 | ORAL_TABLET | ORAL | 0 refills | Status: DC

## 2018-11-13 MED ORDER — PANTOPRAZOLE SODIUM 20 MG PO TBEC: 20.0000 mg | DELAYED_RELEASE_TABLET | Freq: Every day | ORAL | 1 refills | Status: DC

## 2018-11-13 MED ORDER — SODIUM CHLORIDE 0.9 % IV SOLN
Freq: Once | INTRAVENOUS | Status: AC
  Administered 2018-11-13: 09:00:00 via INTRAVENOUS
  Filled 2018-11-13: qty 250

## 2018-11-13 MED ORDER — ACETAMINOPHEN 325 MG PO TABS
650.0000 mg | ORAL_TABLET | Freq: Once | ORAL | Status: AC
  Administered 2018-11-13: 650 mg via ORAL
  Filled 2018-11-13: qty 2

## 2018-11-13 MED ORDER — SODIUM CHLORIDE 0.9 % IV SOLN
800.0000 mg | Freq: Once | INTRAVENOUS | Status: AC
  Administered 2018-11-13: 800 mg via INTRAVENOUS
  Filled 2018-11-13: qty 250

## 2018-11-13 MED ORDER — SODIUM CHLORIDE 0.9 % IV SOLN: Freq: Once | INTRAVENOUS | Status: DC

## 2018-11-13 MED ORDER — DIPHENHYDRAMINE HCL 25 MG PO CAPS
50.0000 mg | ORAL_CAPSULE | Freq: Once | ORAL | Status: AC
  Administered 2018-11-13: 50 mg via ORAL
  Filled 2018-11-13: qty 2

## 2018-11-13 MED ORDER — PREDNISONE 10 MG PO TABS: ORAL_TABLET | ORAL | 0 refills | Status: DC

## 2018-11-13 NOTE — Progress Notes (Signed)
He is doing great, no pain. [name] states that he eats all the time but he looses wt every time that he gets steroids.

## 2018-11-13 NOTE — Progress Notes (Signed)
Hematology/Oncology Consult note [facility]  [phone] [phone]  Patient Care Team: [name], MD as PCP - General (Internal Medicine)   Name of the patient: Bobby Dean  [id number]  1990/08/07   Date of visit: 11/13/18  Diagnosis-autoimmune neutropenia and thrombocytopenia  Chief complaint/ Reason for visit-on treatment assessment prior to cycle 3 of weekly Rituxan  Heme/Onc history: Patient is a 28 year old male was then referred to [place] for evaluation and management of thrombocytopenia. He has a history of autism and intellectual visibility and currently lives in a group home for the last 7 months. His medical decision maker is [name] from [place] office.   Blood work from 04/28/2016 showed white count of 2.1 with an H&H of 15.5/45.1 and a platelet count of 51. CMP was within normal limits. TSH was normal at 1.09. At this time I do not have any prior CBCs for comparison.  Bone marrow biopsy on 05/14/2016 showed normal trilineage hematopoiesis with no evidence of lymphoma or leukemia. Peripheral flow cytometry showed reversal of CD4 to CD8 ratio. HIV hepatitis B and hepatitis C testing was negative. B12 and folate were within normal limits. PT/PTT INR was within normal limits.  Review of peripheral smear showed leukopenia and moderate neutropenia. Thrombocytopenia. RBC morphology is normal. Scattered reactive appearing lymphocytes are seen. No blasts or early forms noted.  Monospot testing was positive. ANA was negative. Quantitative immunoglobulins were within normal limits. Serum copper was within normal limits. CMV DNA PCR negative  Patient was started on empiric high dose steroids for possible autoimmune etiology on 05/28/16. His counts normalized. Then his steroid taper was started. His WBC remained normal but his platelets began to decline 60's.   Plan was to restart full dose steroids and switch to  rituxan second line after receiving pre splenectomy vaccines in anticipation of splenectomy should he not respond to rituxan  Patient has received all his pre splenectomy vaccines. He startedrituxan on 10/14/16. Seen at [facility] for second opinion by [name] who agrees with the plan.  [name] andfollowed byslow steroid taper. Steroids were stopped in September 2018  recurrent neutropenia/ TCP noted in sept 2019. rituxan and steroids restarted on 10/3/19and counts normalized    Interval history-patient has lost 2 pounds since his last visit.  He tends to lose weight paradoxically when he is on steroids.  He is overall tolerating steroids and Rituxan well and denies any complaints at this time  ECOG PS- 0 Pain scale- 0   Review of systems- Review of Systems  Constitutional: Negative for chills, fever, malaise/fatigue and weight loss.  HENT: Negative for congestion, ear discharge and nosebleeds.   Eyes: Negative for blurred vision.  Respiratory: Negative for cough, hemoptysis, sputum production, shortness of breath and wheezing.   Cardiovascular: Negative for chest pain, palpitations, orthopnea and claudication.  Gastrointestinal: Negative for abdominal pain, blood in stool, constipation, diarrhea, heartburn, melena, nausea and vomiting.  Genitourinary: Negative for dysuria, flank pain, frequency, hematuria and urgency.  Musculoskeletal: Negative for back pain, joint pain and myalgias.  Skin: Negative for rash.  Neurological: Negative for dizziness, tingling, focal weakness, seizures, weakness and headaches.  Endo/Heme/Allergies: Does not bruise/bleed easily.  Psychiatric/Behavioral: Negative for depression and suicidal ideas. The patient does not have insomnia.        No Known Allergies   Past Medical History:  Diagnosis Date  . Anxiety   . Autism   . Autism   .  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   Social History   Socioeconomic History  . Marital status: Single    Spouse name: Not on file  . Number of children: Not on file  . Years of education: Not on file  . Highest education level: Not on file  Occupational History  . Not on file  Social Needs  . Financial resource strain: Not on file  . Food insecurity    Worry: Not on file    Inability: Not on file  . Transportation needs    Medical: Not on file    Non-medical: Not on file  Tobacco Use  . Smoking status: Never Smoker  . Smokeless tobacco: Never Used  Substance and Sexual Activity  . Alcohol use: No  . Drug use: No  . Sexual activity: Never  Lifestyle  . Physical activity    Days per week: Not on file    Minutes per session: Not on file  . Stress: Not on file  Relationships  . Social Herbalist on phone: Not on file    Gets together: Not on file    Attends religious service: Not on file    Active member of club or organization: Not on file    Attends meetings of clubs or organizations: Not on file    Relationship status: Not on file  . Intimate partner violence    Fear of current or ex partner: Not on file    Emotionally abused: Not on file    Physically abused: Not on file    Forced sexual activity: Not on file  Other Topics Concern  . Not on file  Social History Narrative  . Not on file    Family History  Problem Relation Age of Onset  . Cancer Mother      Current Outpatient Medications:  .  pantoprazole (PROTONIX) 20 MG tablet, Take 1 tablet (20 mg total) by mouth daily., Disp: 21 tablet, Rfl: 0 .  predniSONE (DELTASONE) 50 MG tablet, 50 mg daily with food for 3 weeks, Disp: 21 tablet, Rfl: 0 .  sulfamethoxazole-trimethoprim (BACTRIM DS) 800-160 MG tablet, Take 1 tablet by mouth 3 (three) times a week., Disp: 9 tablet, Rfl: 0 No current facility-administered medications for this visit.   Facility-Administered Medications Ordered in Other Visits:  .  0.9 %  sodium chloride  infusion, , Intravenous, Once, Sindy Guadeloupe, MD .  acetaminophen (TYLENOL) tablet 650 mg, 650 mg, Oral, Once, Sindy Guadeloupe, MD .  diphenhydrAMINE (BENADRYL) capsule 50 mg, 50 mg, Oral, Once, Sindy Guadeloupe, MD .  riTUXimab-pvvr (RUXIENCE) 800 mg in sodium chloride 0.9 % 170 mL chemo infusion, , Intravenous, Once, Sindy Guadeloupe, MD  Physical exam:  Vitals:   11/13/18 0844  BP: 121/82  Pulse: 67  Resp: 16  Temp: 98 F (36.7 C)  TempSrc: Tympanic  Weight: 165 lb 9.6 oz (75.1 kg)  Height: 6' 2" (1.88 m)   Physical Exam Constitutional:      General: He is not in acute distress. HENT:     Head: Normocephalic and atraumatic.  Eyes:     Pupils: Pupils are equal, round, and reactive to light.  Neck:     Musculoskeletal: Normal range of motion.  Cardiovascular:     Rate and Rhythm: Normal rate and regular rhythm.     Heart sounds: Normal heart sounds.  Pulmonary:     Effort: Pulmonary effort  is normal.     Breath sounds: Normal breath sounds.  Abdominal:     General: Bowel sounds are normal.     Palpations: Abdomen is soft.  Skin:    General: Skin is warm and dry.  Neurological:     Mental Status: He is alert and oriented to person, place, and time.      CMP Latest Ref Rng & Units 11/13/2018  Glucose 70 - 99 mg/dL 81  BUN 6 - 20 mg/dL 17  Creatinine 0.61 - 1.24 mg/dL 0.75  Sodium 135 - 145 mmol/L 142  Potassium 3.5 - 5.1 mmol/L 3.3(L)  Chloride 98 - 111 mmol/L 105  CO2 22 - 32 mmol/L 30  Calcium 8.9 - 10.3 mg/dL 9.2  Total Protein 6.5 - 8.1 g/dL 6.8  Total Bilirubin 0.3 - 1.2 mg/dL 0.7  Alkaline Phos 38 - 126 U/L 47  AST 15 - 41 U/L 13(L)  ALT 0 - 44 U/L 12   CBC Latest Ref Rng & Units 11/13/2018  WBC 4.0 - 10.5 K/uL 5.0  Hemoglobin 13.0 - 17.0 g/dL 14.4  Hematocrit 39.0 - 52.0 % 43.3  Platelets 150 - 400 K/uL 139(L)     Assessment and plan- Patient is a 28 y.o. male with autoimmune thrombocytopenia and neutropenia here for on treatment assessment prior to  cycle 3 of weekly Rituxan  Patient has roughly required steroids and Rituxan every 10 to 12 months when his neutropenia and thrombocytopenia recurs.  This time his platelet counts were down to 29 and after initiating Rituxan and steroids his white count has normalized and platelet count is improved to 139.  Counts are okay to proceed with Rituxan today and he will directly proceed for cycle 4 next week.  Patient is currently on 50 mg of prednisone which she will continue for this week and following that he will go on a steroid taper at 40 mg for 1 week followed by 30 mg, 20 mg, 10 mg and then stop.  Patient will continue pantoprazole and Bactrim prophylaxis while he is on steroids.  Return to clinic in 3 weeks.  Labs: CBC with differential Return to clinic in 5 weeks.  Labs: CBC with differential, CMP.  See Dr. Janese Banks   Visit Diagnosis 1. Autoimmune pancytopenia ([place])      [name], MD, MPH [facility] [phone] 11/13/2018 9:25 AM

## 2018-11-22 ENCOUNTER — Inpatient Hospital Stay: Payer: Medicare Other | Attending: Oncology

## 2018-11-22 ENCOUNTER — Other Ambulatory Visit: Payer: Self-pay

## 2018-11-22 ENCOUNTER — Inpatient Hospital Stay: Payer: Medicare Other

## 2018-11-22 VITALS — BP 140/85 | HR 67 | Resp 18 | Wt 163.4 lb

## 2018-11-22 DIAGNOSIS — D693 Immune thrombocytopenic purpura: Secondary | ICD-10-CM | POA: Diagnosis not present

## 2018-11-22 DIAGNOSIS — Z5112 Encounter for antineoplastic immunotherapy: Secondary | ICD-10-CM | POA: Diagnosis not present

## 2018-11-22 DIAGNOSIS — D61818 Other pancytopenia: Secondary | ICD-10-CM

## 2018-11-22 LAB — CBC WITH DIFFERENTIAL/PLATELET
Abs Immature Granulocytes: 0.01 10*3/uL (ref 0.00–0.07)
Basophils Absolute: 0 10*3/uL (ref 0.0–0.1)
Basophils Relative: 0 %
Eosinophils Absolute: 0 10*3/uL (ref 0.0–0.5)
Eosinophils Relative: 0 %
HCT: 44.6 % (ref 39.0–52.0)
Hemoglobin: 15.1 g/dL (ref 13.0–17.0)
Immature Granulocytes: 0 %
Lymphocytes Relative: 32 %
Lymphs Abs: 1.7 10*3/uL (ref 0.7–4.0)
MCH: 29.5 pg (ref 26.0–34.0)
MCHC: 33.9 g/dL (ref 30.0–36.0)
MCV: 87.1 fL (ref 80.0–100.0)
Monocytes Absolute: 0.7 10*3/uL (ref 0.1–1.0)
Monocytes Relative: 13 %
Neutro Abs: 3 10*3/uL (ref 1.7–7.7)
Neutrophils Relative %: 55 %
Platelets: 143 10*3/uL — ABNORMAL LOW (ref 150–400)
RBC: 5.12 MIL/uL (ref 4.22–5.81)
RDW: 12.7 % (ref 11.5–15.5)
WBC: 5.4 10*3/uL (ref 4.0–10.5)
nRBC: 0 % (ref 0.0–0.2)

## 2018-11-22 LAB — COMPREHENSIVE METABOLIC PANEL
ALT: 13 U/L (ref 0–44)
AST: 14 U/L — ABNORMAL LOW (ref 15–41)
Albumin: 4.5 g/dL (ref 3.5–5.0)
Alkaline Phosphatase: 42 U/L (ref 38–126)
Anion gap: 10 (ref 5–15)
BUN: 10 mg/dL (ref 6–20)
CO2: 29 mmol/L (ref 22–32)
Calcium: 9.5 mg/dL (ref 8.9–10.3)
Chloride: 101 mmol/L (ref 98–111)
Creatinine, Ser: 0.78 mg/dL (ref 0.61–1.24)
GFR calc Af Amer: >60 mL/min (ref >60)
GFR calc non Af Amer: >60 mL/min (ref >60)
Glucose, Bld: 76 mg/dL (ref 70–99)
Potassium: 3.4 mmol/L — ABNORMAL LOW (ref 3.5–5.1)
Sodium: 140 mmol/L (ref 135–145)
Total Bilirubin: 0.8 mg/dL (ref 0.3–1.2)
Total Protein: 6.9 g/dL (ref 6.5–8.1)

## 2018-11-22 MED ORDER — DIPHENHYDRAMINE HCL 25 MG PO CAPS
50.0000 mg | ORAL_CAPSULE | Freq: Once | ORAL | Status: AC
  Administered 2018-11-22: 50 mg via ORAL
  Filled 2018-11-22: qty 2

## 2018-11-22 MED ORDER — ACETAMINOPHEN 325 MG PO TABS
650.0000 mg | ORAL_TABLET | Freq: Once | ORAL | Status: AC
  Administered 2018-11-22: 650 mg via ORAL
  Filled 2018-11-22: qty 2

## 2018-11-22 MED ORDER — SODIUM CHLORIDE 0.9 % IV SOLN
Freq: Once | INTRAVENOUS | Status: AC
  Administered 2018-11-22: 09:00:00 via INTRAVENOUS
  Filled 2018-11-22: qty 250

## 2018-11-22 MED ORDER — SODIUM CHLORIDE 0.9 % IV SOLN
Freq: Once | INTRAVENOUS | Status: AC
  Administered 2018-11-22: 10:00:00 via INTRAVENOUS
  Filled 2018-11-22: qty 170

## 2018-11-28 ENCOUNTER — Other Ambulatory Visit: Payer: Self-pay | Admitting: Oncology

## 2018-12-01 ENCOUNTER — Other Ambulatory Visit: Payer: Self-pay

## 2018-12-04 ENCOUNTER — Inpatient Hospital Stay: Payer: Medicare Other

## 2018-12-04 ENCOUNTER — Other Ambulatory Visit: Payer: Self-pay

## 2018-12-04 DIAGNOSIS — D708 Other neutropenia: Secondary | ICD-10-CM

## 2018-12-04 DIAGNOSIS — Z5112 Encounter for antineoplastic immunotherapy: Secondary | ICD-10-CM | POA: Diagnosis not present

## 2018-12-04 DIAGNOSIS — D693 Immune thrombocytopenic purpura: Secondary | ICD-10-CM | POA: Diagnosis not present

## 2018-12-04 LAB — COMPREHENSIVE METABOLIC PANEL
ALT: 14 U/L (ref 0–44)
AST: 15 U/L (ref 15–41)
Albumin: 4.3 g/dL (ref 3.5–5.0)
Alkaline Phosphatase: 42 U/L (ref 38–126)
Anion gap: 7 (ref 5–15)
BUN: 12 mg/dL (ref 6–20)
CO2: 28 mmol/L (ref 22–32)
Calcium: 8.9 mg/dL (ref 8.9–10.3)
Chloride: 103 mmol/L (ref 98–111)
Creatinine, Ser: 0.68 mg/dL (ref 0.61–1.24)
GFR calc Af Amer: >60 mL/min (ref >60)
GFR calc non Af Amer: >60 mL/min (ref >60)
Glucose, Bld: 75 mg/dL (ref 70–99)
Potassium: 3.8 mmol/L (ref 3.5–5.1)
Sodium: 138 mmol/L (ref 135–145)
Total Bilirubin: 0.6 mg/dL (ref 0.3–1.2)
Total Protein: 6.8 g/dL (ref 6.5–8.1)

## 2018-12-04 LAB — CBC WITH DIFFERENTIAL/PLATELET
Abs Immature Granulocytes: 0.01 10*3/uL (ref 0.00–0.07)
Basophils Absolute: 0 10*3/uL (ref 0.0–0.1)
Basophils Relative: 0 %
Eosinophils Absolute: 0 10*3/uL (ref 0.0–0.5)
Eosinophils Relative: 1 %
HCT: 45.1 % (ref 39.0–52.0)
Hemoglobin: 15.2 g/dL (ref 13.0–17.0)
Immature Granulocytes: 0 %
Lymphocytes Relative: 20 %
Lymphs Abs: 0.9 10*3/uL (ref 0.7–4.0)
MCH: 29.3 pg (ref 26.0–34.0)
MCHC: 33.7 g/dL (ref 30.0–36.0)
MCV: 86.9 fL (ref 80.0–100.0)
Monocytes Absolute: 0.2 10*3/uL (ref 0.1–1.0)
Monocytes Relative: 5 %
Neutro Abs: 3.3 10*3/uL (ref 1.7–7.7)
Neutrophils Relative %: 74 %
Platelets: 111 10*3/uL — ABNORMAL LOW (ref 150–400)
RBC: 5.19 MIL/uL (ref 4.22–5.81)
RDW: 12.4 % (ref 11.5–15.5)
WBC: 4.4 10*3/uL (ref 4.0–10.5)
nRBC: 0 % (ref 0.0–0.2)

## 2018-12-07 ENCOUNTER — Telehealth: Payer: Self-pay | Admitting: *Deleted

## 2018-12-07 MED ORDER — SULFAMETHOXAZOLE-TRIMETHOPRIM 800-160 MG PO TABS: 1.0000 | ORAL_TABLET | ORAL | 0 refills | Status: DC

## 2018-12-07 NOTE — Telephone Encounter (Signed)
[name] called to say that pt needs refill of bactrim. Dr Janese Banks states that it needs to be refilled, sent the rx and let wendy know that it was done

## 2018-12-15 NOTE — Progress Notes (Signed)
No answer, number belongs to Ortencia Kick, caregiver.  Message left.

## 2018-12-18 ENCOUNTER — Inpatient Hospital Stay (HOSPITAL_BASED_OUTPATIENT_CLINIC_OR_DEPARTMENT_OTHER): Payer: Medicare Other | Admitting: Oncology

## 2018-12-18 ENCOUNTER — Other Ambulatory Visit: Payer: Self-pay

## 2018-12-18 ENCOUNTER — Encounter: Payer: Self-pay | Admitting: Oncology

## 2018-12-18 ENCOUNTER — Inpatient Hospital Stay: Payer: Medicare Other | Attending: Oncology

## 2018-12-18 VITALS — BP 112/58 | HR 72 | Temp 98.3°F | Resp 16 | Ht 74.0 in | Wt 165.4 lb

## 2018-12-18 DIAGNOSIS — D61818 Other pancytopenia: Secondary | ICD-10-CM

## 2018-12-18 DIAGNOSIS — F419 Anxiety disorder, unspecified: Secondary | ICD-10-CM | POA: Insufficient documentation

## 2018-12-18 DIAGNOSIS — D709 Neutropenia, unspecified: Secondary | ICD-10-CM | POA: Insufficient documentation

## 2018-12-18 DIAGNOSIS — F840 Autistic disorder: Secondary | ICD-10-CM | POA: Insufficient documentation

## 2018-12-18 DIAGNOSIS — D696 Thrombocytopenia, unspecified: Secondary | ICD-10-CM | POA: Diagnosis not present

## 2018-12-18 LAB — CBC WITH DIFFERENTIAL/PLATELET
Abs Immature Granulocytes: 0 10*3/uL (ref 0.00–0.07)
Basophils Absolute: 0 10*3/uL (ref 0.0–0.1)
Basophils Relative: 0 %
Eosinophils Absolute: 0 10*3/uL (ref 0.0–0.5)
Eosinophils Relative: 1 %
HCT: 42.6 % (ref 39.0–52.0)
Hemoglobin: 14.6 g/dL (ref 13.0–17.0)
Immature Granulocytes: 0 %
Lymphocytes Relative: 27 %
Lymphs Abs: 1.1 10*3/uL (ref 0.7–4.0)
MCH: 29.2 pg (ref 26.0–34.0)
MCHC: 34.3 g/dL (ref 30.0–36.0)
MCV: 85.2 fL (ref 80.0–100.0)
Monocytes Absolute: 0.3 10*3/uL (ref 0.1–1.0)
Monocytes Relative: 9 %
Neutro Abs: 2.5 10*3/uL (ref 1.7–7.7)
Neutrophils Relative %: 63 %
Platelets: 155 10*3/uL (ref 150–400)
RBC: 5 MIL/uL (ref 4.22–5.81)
RDW: 12.6 % (ref 11.5–15.5)
WBC: 4 10*3/uL (ref 4.0–10.5)
nRBC: 0 % (ref 0.0–0.2)

## 2018-12-18 LAB — COMPREHENSIVE METABOLIC PANEL
ALT: 12 U/L (ref 0–44)
AST: 14 U/L — ABNORMAL LOW (ref 15–41)
Albumin: 4.2 g/dL (ref 3.5–5.0)
Alkaline Phosphatase: 41 U/L (ref 38–126)
Anion gap: 11 (ref 5–15)
BUN: 13 mg/dL (ref 6–20)
CO2: 26 mmol/L (ref 22–32)
Calcium: 9 mg/dL (ref 8.9–10.3)
Chloride: 103 mmol/L (ref 98–111)
Creatinine, Ser: 0.79 mg/dL (ref 0.61–1.24)
GFR calc Af Amer: >60 mL/min (ref >60)
GFR calc non Af Amer: >60 mL/min (ref >60)
Glucose, Bld: 103 mg/dL — ABNORMAL HIGH (ref 70–99)
Potassium: 3.7 mmol/L (ref 3.5–5.1)
Sodium: 140 mmol/L (ref 135–145)
Total Bilirubin: 0.7 mg/dL (ref 0.3–1.2)
Total Protein: 6.7 g/dL (ref 6.5–8.1)

## 2018-12-18 NOTE — Progress Notes (Signed)
Hematology/Oncology Consult note [facility]  Telephone:([phone] [phone]  Patient Care Team: [name], MD as PCP - General (Internal Medicine)   Name of the patient: Bobby Dean  [id number]  07/03/1990   Date of visit: 12/18/18  Diagnosis-autoimmune neutropenia and thrombocytopenia  Chief complaint/ Reason for visit-I sent blood work after 4 cycles of Rituxan  Heme/Onc history: Patient is a 28 year old male was then referred to [place] for evaluation and management of thrombocytopenia. He has a history of autism and intellectual visibility and currently lives in a group home for the last 7 months. His medical decision maker is [name] from [place] office.   Blood work from 04/28/2016 showed white count of 2.1 with an H&H of 15.5/45.1 and a platelet count of 51. CMP was within normal limits. TSH was normal at 1.09. At this time I do not have any prior CBCs for comparison.  Bone marrow biopsy on 05/14/2016 showed normal trilineage hematopoiesis with no evidence of lymphoma or leukemia. Peripheral flow cytometry showed reversal of CD4 to CD8 ratio. HIV hepatitis B and hepatitis C testing was negative. B12 and folate were within normal limits. PT/PTT INR was within normal limits.  Review of peripheral smear showed leukopenia and moderate neutropenia. Thrombocytopenia. RBC morphology is normal. Scattered reactive appearing lymphocytes are seen. No blasts or early forms noted.  Monospot testing was positive. ANA was negative. Quantitative immunoglobulins were within normal limits. Serum copper was within normal limits. CMV DNA PCR negative  Patient was started on empiric high dose steroids for possible autoimmune etiology on 05/28/16. His counts normalized. Then his steroid taper was started. His WBC remained normal but his platelets began to decline 60's.   Plan was to restart full dose steroids and switch to rituxan second  line after receiving pre splenectomy vaccines in anticipation of splenectomy should he not respond to rituxan  Patient has received all his pre splenectomy vaccines. He startedrituxan on 10/14/16. Seen at [facility] for second opinion by [name] who agrees with the plan.  [name] andfollowed byslow steroid taper. Steroids were stopped in September 2018  recurrent neutropenia/ TCP noted in sept 2019. rituxan and steroids restarted on 10/3/19and counts normalized   Interval history-feels well and denies any complaints at this time  ECOG PS- 0 Pain scale- 0   Review of systems- Review of Systems  Constitutional: Negative for chills, fever, malaise/fatigue and weight loss.  HENT: Negative for congestion, ear discharge and nosebleeds.   Eyes: Negative for blurred vision.  Respiratory: Negative for cough, hemoptysis, sputum production, shortness of breath and wheezing.   Cardiovascular: Negative for chest pain, palpitations, orthopnea and claudication.  Gastrointestinal: Negative for abdominal pain, blood in stool, constipation, diarrhea, heartburn, melena, nausea and vomiting.  Genitourinary: Negative for dysuria, flank pain, frequency, hematuria and urgency.  Musculoskeletal: Negative for back pain, joint pain and myalgias.  Skin: Negative for rash.  Neurological: Negative for dizziness, tingling, focal weakness, seizures, weakness and headaches.  Endo/Heme/Allergies: Does not bruise/bleed easily.  Psychiatric/Behavioral: Negative for depression and suicidal ideas. The patient does not have insomnia.       No Known Allergies   Past Medical History:  Diagnosis Date  . Anxiety   . Autism   . Autism   . Autoimmune pancytopenia ([place])      History reviewed. No pertinent surgical history.  Social History   Socioeconomic History  . Marital status: Single  Spouse name: Not on file  . Number of children: Not on file  . Years of  education: Not on file  . Highest education level: Not on file  Occupational History  . Not on file  Social Needs  . Financial resource strain: Not on file  . Food insecurity    Worry: Not on file    Inability: Not on file  . Transportation needs    Medical: Not on file    Non-medical: Not on file  Tobacco Use  . Smoking status: Never Smoker  . Smokeless tobacco: Never Used  Substance and Sexual Activity  . Alcohol use: No  . Drug use: No  . Sexual activity: Never  Lifestyle  . Physical activity    Days per week: Not on file    Minutes per session: Not on file  . Stress: Not on file  Relationships  . Social Herbalist on phone: Not on file    Gets together: Not on file    Attends religious service: Not on file    Active member of club or organization: Not on file    Attends meetings of clubs or organizations: Not on file    Relationship status: Not on file  . Intimate partner violence    Fear of current or ex partner: Not on file    Emotionally abused: Not on file    Physically abused: Not on file    Forced sexual activity: Not on file  Other Topics Concern  . Not on file  Social History Narrative  . Not on file    Family History  Problem Relation Age of Onset  . Cancer Mother      Current Outpatient Medications:  .  pantoprazole (PROTONIX) 20 MG tablet, TAKE 1 TABLET BY MOUTH ONCE DAILY., Disp: 10 tablet, Rfl: 0 .  predniSONE (DELTASONE) 10 MG tablet, 5 tablets daily x 1 wk, 4 tablets daily x 1 wk, 3 tablets daily x 1 wk, 2 tablets daily x 1 wk, then 1 tablet daily x 1 wk -(with food every day), Disp: 105 tablet, Rfl: 0 .  sulfamethoxazole-trimethoprim (BACTRIM DS) 800-160 MG tablet, Take 1 tablet by mouth 3 (three) times a week., Disp: 15 tablet, Rfl: 0  Physical exam:  Vitals:   12/18/18 0925  BP: (!) 112/58  Pulse: 72  Resp: 16  Temp: 98.3 F (36.8 C)  TempSrc: Tympanic  Weight: 165 lb 6.4 oz (75 kg)  Height: 6' 2"  (1.88 m)   Physical  Exam HENT:     Head: Normocephalic and atraumatic.  Eyes:     Pupils: Pupils are equal, round, and reactive to light.  Neck:     Musculoskeletal: Normal range of motion.  Cardiovascular:     Rate and Rhythm: Normal rate and regular rhythm.     Heart sounds: Normal heart sounds.  Pulmonary:     Effort: Pulmonary effort is normal.     Breath sounds: Normal breath sounds.  Abdominal:     General: Bowel sounds are normal.     Palpations: Abdomen is soft.  Skin:    General: Skin is warm and dry.  Neurological:     Mental Status: He is alert and oriented to person, place, and time.      CMP Latest Ref Rng & Units 12/04/2018  Glucose 70 - 99 mg/dL 75  BUN 6 - 20 mg/dL 12  Creatinine 0.61 - 1.24 mg/dL 0.68  Sodium 135 - 145 mmol/L 138  Potassium 3.5 - 5.1 mmol/L 3.8  Chloride 98 - 111 mmol/L 103  CO2 22 - 32 mmol/L 28  Calcium 8.9 - 10.3 mg/dL 8.9  Total Protein 6.5 - 8.1 g/dL 6.8  Total Bilirubin 0.3 - 1.2 mg/dL 0.6  Alkaline Phos 38 - 126 U/L 42  AST 15 - 41 U/L 15  ALT 0 - 44 U/L 14   CBC Latest Ref Rng & Units 12/18/2018  WBC 4.0 - 10.5 K/uL 4.0  Hemoglobin 13.0 - 17.0 g/dL 14.6  Hematocrit 39.0 - 52.0 % 42.6  Platelets 150 - 400 K/uL 155     Assessment and plan- Patient is a 28 y.o. male with autoimmune neutropenia and thrombocytopenia status post 4 cycles of Rituxan  Patient is currently on a tapering dose of steroids and will be done with his steroids in the next week.  He is also completed 4 cycles of weekly Rituxan.  His neutropenia and thrombocytopenia has resolved.  He has been requiring Rituxan And steroids roughly every 10 months.  Repeat CBC with differential in 1 month 2 months and 3 months and I will see him back in 3 months.  He can come off Bactrim and Protonix once he is done with his steroids.  He has already received flu shot for this season   Visit Diagnosis 1. Autoimmune pancytopenia ([place] )      [name], MD, MPH [facility] at [facility] [phone] 12/18/2018 9:42 AM

## 2018-12-18 NOTE — Progress Notes (Signed)
Pt not having any problems 

## 2019-01-17 ENCOUNTER — Other Ambulatory Visit: Payer: Self-pay

## 2019-01-18 ENCOUNTER — Inpatient Hospital Stay: Payer: Medicare Other | Attending: Oncology

## 2019-01-18 ENCOUNTER — Telehealth: Payer: Self-pay | Admitting: *Deleted

## 2019-01-18 ENCOUNTER — Other Ambulatory Visit: Payer: Self-pay

## 2019-01-18 ENCOUNTER — Other Ambulatory Visit: Payer: Self-pay | Admitting: *Deleted

## 2019-01-18 DIAGNOSIS — D61818 Other pancytopenia: Secondary | ICD-10-CM

## 2019-01-18 LAB — CBC WITH DIFFERENTIAL/PLATELET
Abs Immature Granulocytes: 0 10*3/uL (ref 0.00–0.07)
Basophils Absolute: 0 10*3/uL (ref 0.0–0.1)
Basophils Relative: 1 %
Eosinophils Absolute: 0.1 10*3/uL (ref 0.0–0.5)
Eosinophils Relative: 3 %
HCT: 42.4 % (ref 39.0–52.0)
Hemoglobin: 14.2 g/dL (ref 13.0–17.0)
Immature Granulocytes: 0 %
Lymphocytes Relative: 40 %
Lymphs Abs: 1.1 10*3/uL (ref 0.7–4.0)
MCH: 29 pg (ref 26.0–34.0)
MCHC: 33.5 g/dL (ref 30.0–36.0)
MCV: 86.7 fL (ref 80.0–100.0)
Monocytes Absolute: 0.4 10*3/uL (ref 0.1–1.0)
Monocytes Relative: 13 %
Neutro Abs: 1.3 10*3/uL — ABNORMAL LOW (ref 1.7–7.7)
Neutrophils Relative %: 43 %
Platelets: 138 10*3/uL — ABNORMAL LOW (ref 150–400)
RBC: 4.89 MIL/uL (ref 4.22–5.81)
RDW: 12.7 % (ref 11.5–15.5)
WBC: 2.9 10*3/uL — ABNORMAL LOW (ref 4.0–10.5)
nRBC: 0 % (ref 0.0–0.2)

## 2019-01-18 NOTE — Telephone Encounter (Signed)
Called and spoke to Bobby Dean and told her that pt wbc and plt low but not bad drop. She wanted pt to have labs in 4 weeks. While I was on phone with Bobby Dean dr Janese Banks said to do labs in 2 weeks and 4 week and Bobby Dean was given both appt while on phone

## 2019-01-18 NOTE — Telephone Encounter (Signed)
-----   Message from Sindy Guadeloupe, MD sent at 01/18/2019 11:38 AM EST ----- Neutropenia fairly quickly after rituxan is concerning. I would like to repeat cbc with diff in 1 month to see wheer this is going. If it is lower, I will restart steroids and he will need to see surgeon for possible splenectomy. Thanks, Astrid Divine

## 2019-01-29 ENCOUNTER — Other Ambulatory Visit: Payer: Self-pay

## 2019-01-29 DIAGNOSIS — R5381 Other malaise: Secondary | ICD-10-CM | POA: Diagnosis not present

## 2019-01-29 DIAGNOSIS — D72819 Decreased white blood cell count, unspecified: Secondary | ICD-10-CM | POA: Diagnosis not present

## 2019-01-29 DIAGNOSIS — I10 Essential (primary) hypertension: Secondary | ICD-10-CM | POA: Diagnosis not present

## 2019-01-29 DIAGNOSIS — D72818 Other decreased white blood cell count: Secondary | ICD-10-CM | POA: Diagnosis not present

## 2019-01-29 DIAGNOSIS — E7849 Other hyperlipidemia: Secondary | ICD-10-CM | POA: Diagnosis not present

## 2019-01-29 DIAGNOSIS — D72810 Lymphocytopenia: Secondary | ICD-10-CM | POA: Diagnosis not present

## 2019-01-30 ENCOUNTER — Telehealth: Payer: Self-pay | Admitting: *Deleted

## 2019-01-30 ENCOUNTER — Other Ambulatory Visit: Payer: Self-pay

## 2019-01-30 ENCOUNTER — Inpatient Hospital Stay: Payer: Medicare Other

## 2019-01-30 DIAGNOSIS — D61818 Other pancytopenia: Secondary | ICD-10-CM | POA: Diagnosis not present

## 2019-01-30 LAB — CBC WITH DIFFERENTIAL/PLATELET
Abs Immature Granulocytes: 0 10*3/uL (ref 0.00–0.07)
Basophils Absolute: 0 10*3/uL (ref 0.0–0.1)
Basophils Relative: 1 %
Eosinophils Absolute: 0.1 10*3/uL (ref 0.0–0.5)
Eosinophils Relative: 3 %
HCT: 42.5 % (ref 39.0–52.0)
Hemoglobin: 14.3 g/dL (ref 13.0–17.0)
Immature Granulocytes: 0 %
Lymphocytes Relative: 39 %
Lymphs Abs: 1.2 10*3/uL (ref 0.7–4.0)
MCH: 28.9 pg (ref 26.0–34.0)
MCHC: 33.6 g/dL (ref 30.0–36.0)
MCV: 86 fL (ref 80.0–100.0)
Monocytes Absolute: 0.4 10*3/uL (ref 0.1–1.0)
Monocytes Relative: 12 %
Neutro Abs: 1.5 10*3/uL — ABNORMAL LOW (ref 1.7–7.7)
Neutrophils Relative %: 45 %
Platelets: 140 10*3/uL — ABNORMAL LOW (ref 150–400)
RBC: 4.94 MIL/uL (ref 4.22–5.81)
RDW: 12.8 % (ref 11.5–15.5)
WBC: 3.1 10*3/uL — ABNORMAL LOW (ref 4.0–10.5)
nRBC: 0 % (ref 0.0–0.2)

## 2019-01-30 NOTE — Telephone Encounter (Signed)
Called Bobby Dean and told her the numbers is better and she is glad and we will keep the lab appt 12/3, he also had one for 12/4 and I cancelled that. Bobby Dean is aware

## 2019-02-15 ENCOUNTER — Other Ambulatory Visit: Payer: Self-pay

## 2019-02-15 ENCOUNTER — Inpatient Hospital Stay: Payer: Medicare Other | Attending: Oncology

## 2019-02-15 DIAGNOSIS — D61818 Other pancytopenia: Secondary | ICD-10-CM | POA: Diagnosis not present

## 2019-02-15 LAB — CBC WITH DIFFERENTIAL/PLATELET
Abs Immature Granulocytes: 0.01 10*3/uL (ref 0.00–0.07)
Basophils Absolute: 0 10*3/uL (ref 0.0–0.1)
Basophils Relative: 0 %
Eosinophils Absolute: 0.1 10*3/uL (ref 0.0–0.5)
Eosinophils Relative: 3 %
HCT: 41.2 % (ref 39.0–52.0)
Hemoglobin: 13.4 g/dL (ref 13.0–17.0)
Immature Granulocytes: 0 %
Lymphocytes Relative: 35 %
Lymphs Abs: 1.2 10*3/uL (ref 0.7–4.0)
MCH: 28.8 pg (ref 26.0–34.0)
MCHC: 32.5 g/dL (ref 30.0–36.0)
MCV: 88.4 fL (ref 80.0–100.0)
Monocytes Absolute: 0.4 10*3/uL (ref 0.1–1.0)
Monocytes Relative: 11 %
Neutro Abs: 1.8 10*3/uL (ref 1.7–7.7)
Neutrophils Relative %: 51 %
Platelets: 145 10*3/uL — ABNORMAL LOW (ref 150–400)
RBC: 4.66 MIL/uL (ref 4.22–5.81)
RDW: 13 % (ref 11.5–15.5)
WBC: 3.6 10*3/uL — ABNORMAL LOW (ref 4.0–10.5)
nRBC: 0 % (ref 0.0–0.2)

## 2019-02-16 ENCOUNTER — Telehealth: Payer: Self-pay | Admitting: *Deleted

## 2019-02-16 ENCOUNTER — Other Ambulatory Visit: Payer: Self-pay | Admitting: *Deleted

## 2019-02-16 ENCOUNTER — Other Ambulatory Visit: Payer: Medicare Other

## 2019-02-16 DIAGNOSIS — D61818 Other pancytopenia: Secondary | ICD-10-CM

## 2019-02-16 NOTE — Telephone Encounter (Signed)
Called [name] to let her know that Josh's white count had came up to be 3.6 and his platelets have come up to be 145.  Since the patient is coming up slowly Dr. Janese Banks says that we will do another lab work in 2 weeks and the appointment was given December 17 at 1015.  He will keep his original appointment that he already had for March 19, 2019.  When he would ride in her book and make sure he is here for that date .

## 2019-03-01 ENCOUNTER — Other Ambulatory Visit: Payer: Medicare Other

## 2019-03-05 ENCOUNTER — Other Ambulatory Visit: Payer: Self-pay

## 2019-03-05 ENCOUNTER — Inpatient Hospital Stay: Payer: Medicare Other

## 2019-03-05 DIAGNOSIS — D61818 Other pancytopenia: Secondary | ICD-10-CM | POA: Diagnosis not present

## 2019-03-05 LAB — CBC WITH DIFFERENTIAL/PLATELET
Abs Immature Granulocytes: 0 10*3/uL (ref 0.00–0.07)
Basophils Absolute: 0 10*3/uL (ref 0.0–0.1)
Basophils Relative: 1 %
Eosinophils Absolute: 0.1 10*3/uL (ref 0.0–0.5)
Eosinophils Relative: 1 %
HCT: 44.4 % (ref 39.0–52.0)
Hemoglobin: 14.3 g/dL (ref 13.0–17.0)
Immature Granulocytes: 0 %
Lymphocytes Relative: 40 %
Lymphs Abs: 1.5 10*3/uL (ref 0.7–4.0)
MCH: 28.7 pg (ref 26.0–34.0)
MCHC: 32.2 g/dL (ref 30.0–36.0)
MCV: 89 fL (ref 80.0–100.0)
Monocytes Absolute: 0.4 10*3/uL (ref 0.1–1.0)
Monocytes Relative: 11 %
Neutro Abs: 1.8 10*3/uL (ref 1.7–7.7)
Neutrophils Relative %: 47 %
Platelets: 150 10*3/uL (ref 150–400)
RBC: 4.99 MIL/uL (ref 4.22–5.81)
RDW: 13 % (ref 11.5–15.5)
WBC: 3.8 10*3/uL — ABNORMAL LOW (ref 4.0–10.5)
nRBC: 0 % (ref 0.0–0.2)

## 2019-03-15 ENCOUNTER — Other Ambulatory Visit: Payer: Medicare Other

## 2019-03-19 ENCOUNTER — Encounter: Payer: Self-pay | Admitting: Oncology

## 2019-03-19 ENCOUNTER — Inpatient Hospital Stay (HOSPITAL_BASED_OUTPATIENT_CLINIC_OR_DEPARTMENT_OTHER): Payer: Medicare Other | Admitting: Oncology

## 2019-03-19 ENCOUNTER — Inpatient Hospital Stay: Payer: Medicare Other | Attending: Oncology

## 2019-03-19 ENCOUNTER — Other Ambulatory Visit: Payer: Self-pay

## 2019-03-19 VITALS — BP 124/83 | HR 68 | Temp 97.8°F | Ht 74.0 in | Wt 174.0 lb

## 2019-03-19 DIAGNOSIS — F840 Autistic disorder: Secondary | ICD-10-CM | POA: Insufficient documentation

## 2019-03-19 DIAGNOSIS — D61818 Other pancytopenia: Secondary | ICD-10-CM | POA: Diagnosis not present

## 2019-03-19 LAB — CBC WITH DIFFERENTIAL/PLATELET
Abs Immature Granulocytes: 0 10*3/uL (ref 0.00–0.07)
Basophils Absolute: 0 10*3/uL (ref 0.0–0.1)
Basophils Relative: 0 %
Eosinophils Absolute: 0.1 10*3/uL (ref 0.0–0.5)
Eosinophils Relative: 3 %
HCT: 42.9 % (ref 39.0–52.0)
Hemoglobin: 13.7 g/dL (ref 13.0–17.0)
Immature Granulocytes: 0 %
Lymphocytes Relative: 37 %
Lymphs Abs: 1.4 10*3/uL (ref 0.7–4.0)
MCH: 28.8 pg (ref 26.0–34.0)
MCHC: 31.9 g/dL (ref 30.0–36.0)
MCV: 90.3 fL (ref 80.0–100.0)
Monocytes Absolute: 0.4 10*3/uL (ref 0.1–1.0)
Monocytes Relative: 12 %
Neutro Abs: 1.8 10*3/uL (ref 1.7–7.7)
Neutrophils Relative %: 48 %
Platelets: 167 10*3/uL (ref 150–400)
RBC: 4.75 MIL/uL (ref 4.22–5.81)
RDW: 12.9 % (ref 11.5–15.5)
WBC: 3.7 10*3/uL — ABNORMAL LOW (ref 4.0–10.5)
nRBC: 0 % (ref 0.0–0.2)

## 2019-03-19 NOTE — Progress Notes (Signed)
Patient stated that he had been doing well with no complaints. 

## 2019-03-19 NOTE — Progress Notes (Signed)
Hematology/Oncology Consult note [facility]  Telephone:([phone] [phone]  Patient Care Team: [name], MD as PCP - General (Internal Medicine)   Name of the patient: Bobby Dean  [id number]  [birth year]   Date of visit: 03/19/19  Diagnosis- autoimmune neutropenia and thrombocytopenia  Chief complaint/ Reason for visit-routine follow-up of autoimmune pancytopenia  Heme/Onc history: Patient is a 29 year old male was then referred to [place] for evaluation and management of thrombocytopenia. He has a history of autism and intellectual visibility and currently lives in a group home for the last 7 months. His medical decision maker is [name] from [place] office.   Blood work from 04/28/2016 showed white count of 2.1 with an H&H of 15.5/45.1 and a platelet count of 51. CMP was within normal limits. TSH was normal at 1.09. At this time I do not have any prior CBCs for comparison.  Bone marrow biopsy on 05/14/2016 showed normal trilineage hematopoiesis with no evidence of lymphoma or leukemia. Peripheral flow cytometry showed reversal of CD4 to CD8 ratio. HIV hepatitis B and hepatitis C testing was negative. B12 and folate were within normal limits. PT/PTT INR was within normal limits.  Review of peripheral smear showed leukopenia and moderate neutropenia. Thrombocytopenia. RBC morphology is normal. Scattered reactive appearing lymphocytes are seen. No blasts or early forms noted.  Monospot testing was positive. ANA was negative. Quantitative immunoglobulins were within normal limits. Serum copper was within normal limits. CMV DNA PCR negative  Patient was started on empiric high dose steroids for possible autoimmune etiology on 05/28/16. His counts normalized. Then his steroid taper was started. His WBC remained normal but his platelets began to decline 60's.   Plan was to restart full dose steroids and switch to rituxan  second line after receiving pre splenectomy vaccines in anticipation of splenectomy should he not respond to rituxan  Patient has received all his pre splenectomy vaccines. He startedrituxan on 10/14/16. Seen at [facility] for second opinion by [name] who agrees with the plan.  [name] andfollowed byslow steroid taper. Steroids were stopped in September 2018  recurrent neutropenia/ TCP noted in sept 2019. rituxan and steroids restarted on 10/3/19and counts normalized.  Patient has been requiring Rituxan roughly every 8 to 12 months   Interval history-overall he feels good today and has gained weight since his last visit.  Denies any complaints at this time  ECOG PS- 0 Pain scale- 0   Review of systems- Review of Systems  Constitutional: Negative for chills, fever, malaise/fatigue and weight loss.  HENT: Negative for congestion, ear discharge and nosebleeds.   Eyes: Negative for blurred vision.  Respiratory: Negative for cough, hemoptysis, sputum production, shortness of breath and wheezing.   Cardiovascular: Negative for chest pain, palpitations, orthopnea and claudication.  Gastrointestinal: Negative for abdominal pain, blood in stool, constipation, diarrhea, heartburn, melena, nausea and vomiting.  Genitourinary: Negative for dysuria, flank pain, frequency, hematuria and urgency.  Musculoskeletal: Negative for back pain, joint pain and myalgias.  Skin: Negative for rash.  Neurological: Negative for dizziness, tingling, focal weakness, seizures, weakness and headaches.  Endo/Heme/Allergies: Does not bruise/bleed easily.  Psychiatric/Behavioral: Negative for depression and suicidal ideas. The patient does not have insomnia.      No Known Allergies   Past Medical History:  Diagnosis Date  . Anxiety   . Autism   . Autism   . 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  No pertinent surgical history.  Social History    Socioeconomic History  . Marital status: Single    Spouse name: Not on file  . Number of children: Not on file  . Years of education: Not on file  . Highest education level: Not on file  Occupational History  . Not on file  Tobacco Use  . Smoking status: Never Smoker  . Smokeless tobacco: Never Used  Substance and Sexual Activity  . Alcohol use: No  . Drug use: No  . Sexual activity: Never  Other Topics Concern  . Not on file  Social History Narrative  . Not on file   Social Determinants of Health   Financial Resource Strain:   . Difficulty of Paying Living Expenses: Not on file  Food Insecurity:   . Worried About Charity fundraiser in the Last Year: Not on file  . Ran Out of Food in the Last Year: Not on file  Transportation Needs:   . Lack of Transportation (Medical): Not on file  . Lack of Transportation (Non-Medical): Not on file  Physical Activity:   . Days of Exercise per Week: Not on file  . Minutes of Exercise per Session: Not on file  Stress:   . Feeling of Stress : Not on file  Social Connections:   . Frequency of Communication with Friends and Family: Not on file  . Frequency of Social Gatherings with Friends and Family: Not on file  . Attends Religious Services: Not on file  . Active Member of Clubs or Organizations: Not on file  . Attends Archivist Meetings: Not on file  . Marital Status: Not on file  Intimate Partner Violence:   . Fear of Current or Ex-Partner: Not on file  . Emotionally Abused: Not on file  . Physically Abused: Not on file  . Sexually Abused: Not on file    Family History  Problem Relation Age of Onset  . Cancer Mother      Current Outpatient Medications:  .  Ascorbic Acid (VITAMIN C) 1000 MG tablet, Take 1,000 mg by mouth daily., Disp: , Rfl:  .  cholecalciferol (VITAMIN D) 25 MCG (1000 UT) tablet, Take 1 tablet by mouth daily., Disp: , Rfl:  .  pantoprazole (PROTONIX) 20 MG tablet, TAKE 1 TABLET BY MOUTH ONCE  DAILY. (Patient not taking: Reported on 03/19/2019), Disp: 10 tablet, Rfl: 0 .  predniSONE (DELTASONE) 10 MG tablet, 5 tablets daily x 1 wk, 4 tablets daily x 1 wk, 3 tablets daily x 1 wk, 2 tablets daily x 1 wk, then 1 tablet daily x 1 wk -(with food every day) (Patient not taking: Reported on 03/19/2019), Disp: 105 tablet, Rfl: 0 .  sulfamethoxazole-trimethoprim (BACTRIM DS) 800-160 MG tablet, Take 1 tablet by mouth 3 (three) times a week. (Patient not taking: Reported on 03/19/2019), Disp: 15 tablet, Rfl: 0  Physical exam:  Vitals:   03/19/19 0930  BP: 124/83  Pulse: 68  Temp: 97.8 F (36.6 C)  TempSrc: Tympanic  Weight: 174 lb (78.9 kg)  Height: '6\' 2"'$  (1.88 m)   Physical Exam Constitutional:      General: He is not in acute distress. HENT:     Head: Normocephalic and atraumatic.  Eyes:     Pupils: Pupils are equal, round, and reactive to light.  Cardiovascular:     Rate and Rhythm: Normal rate and regular rhythm.     Heart sounds: Normal heart sounds.  Pulmonary:  Effort: Pulmonary effort is normal.     Breath sounds: Normal breath sounds.  Abdominal:     General: Bowel sounds are normal.     Palpations: Abdomen is soft.  Musculoskeletal:     Cervical back: Normal range of motion.  Skin:    General: Skin is warm and dry.  Neurological:     Mental Status: He is alert and oriented to person, place, and time.      CMP Latest Ref Rng & Units 12/18/2018  Glucose 70 - 99 mg/dL 103(H)  BUN 6 - 20 mg/dL 13  Creatinine 0.61 - 1.24 mg/dL 0.79  Sodium 135 - 145 mmol/L 140  Potassium 3.5 - 5.1 mmol/L 3.7  Chloride 98 - 111 mmol/L 103  CO2 22 - 32 mmol/L 26  Calcium 8.9 - 10.3 mg/dL 9.0  Total Protein 6.5 - 8.1 g/dL 6.7  Total Bilirubin 0.3 - 1.2 mg/dL 0.7  Alkaline Phos 38 - 126 U/L 41  AST 15 - 41 U/L 14(L)  ALT 0 - 44 U/L 12   CBC Latest Ref Rng & Units 03/19/2019  WBC 4.0 - 10.5 K/uL 3.7(L)  Hemoglobin 13.0 - 17.0 g/dL 13.7  Hematocrit 39.0 - 52.0 % 42.9  Platelets 150  - 400 K/uL 167      Assessment and plan- Patient is a 29 y.o. male with autoimmune pancytopenia here for routine follow-up  I have personally reviewed his labs that were done today.  Patient continues to have mild leukopenia but his white cell count has remained stable between 3-4.  ANC today is 1.8.  Hemoglobin and platelets are normal.  He last received Rituxan on 11/22/2018.  Given the stability of counts he does not require any steroids or Rituxan at this time.  Repeat CBC with differential in 6 weeks and 12 weeks and I will see him back in 12 weeks for a video visit.  With regards to: Vaccine: Although he has an underlying possible autoimmune etiology, I do believe that the benefits of Covid vaccine outweigh the risks in his case.  He does have mild neutropenia and if he were to get symptomatic Covid he is at a risk of complications.  Also he is closer to 4 months away from his Rituxan and it would be in his best interest to receive vaccine as he gets further away from Rituxan.  If he requires Rituxan again in the future his B-cell immunity would be suppressed for a while and he may not mount an adequate immune response to the vaccine.   Visit Diagnosis 1. 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, MD, MPH [facility] at [facility] [phone] 03/19/2019 2:49 PM

## 2019-03-21 DIAGNOSIS — Z23 Encounter for immunization: Secondary | ICD-10-CM | POA: Diagnosis not present

## 2019-04-19 DIAGNOSIS — Z23 Encounter for immunization: Secondary | ICD-10-CM | POA: Diagnosis not present

## 2019-04-30 ENCOUNTER — Inpatient Hospital Stay: Payer: Medicare Other

## 2019-05-01 ENCOUNTER — Inpatient Hospital Stay: Payer: Medicare Other | Attending: Oncology

## 2019-05-01 ENCOUNTER — Other Ambulatory Visit: Payer: Self-pay

## 2019-05-01 DIAGNOSIS — D61818 Other pancytopenia: Secondary | ICD-10-CM | POA: Diagnosis not present

## 2019-05-01 LAB — CBC WITH DIFFERENTIAL/PLATELET
Abs Immature Granulocytes: 0 10*3/uL (ref 0.00–0.07)
Basophils Absolute: 0 10*3/uL (ref 0.0–0.1)
Basophils Relative: 0 %
Eosinophils Absolute: 0.1 10*3/uL (ref 0.0–0.5)
Eosinophils Relative: 3 %
HCT: 42 % (ref 39.0–52.0)
Hemoglobin: 13.7 g/dL (ref 13.0–17.0)
Immature Granulocytes: 0 %
Lymphocytes Relative: 41 %
Lymphs Abs: 1.3 10*3/uL (ref 0.7–4.0)
MCH: 29 pg (ref 26.0–34.0)
MCHC: 32.6 g/dL (ref 30.0–36.0)
MCV: 88.8 fL (ref 80.0–100.0)
Monocytes Absolute: 0.3 10*3/uL (ref 0.1–1.0)
Monocytes Relative: 11 %
Neutro Abs: 1.5 10*3/uL — ABNORMAL LOW (ref 1.7–7.7)
Neutrophils Relative %: 45 %
Platelets: 181 10*3/uL (ref 150–400)
RBC: 4.73 MIL/uL (ref 4.22–5.81)
RDW: 12.3 % (ref 11.5–15.5)
WBC: 3.2 10*3/uL — ABNORMAL LOW (ref 4.0–10.5)
nRBC: 0 % (ref 0.0–0.2)

## 2019-06-11 ENCOUNTER — Other Ambulatory Visit: Payer: Self-pay

## 2019-06-11 ENCOUNTER — Inpatient Hospital Stay: Payer: Medicare Other | Attending: Oncology

## 2019-06-11 DIAGNOSIS — D61818 Other pancytopenia: Secondary | ICD-10-CM | POA: Diagnosis not present

## 2019-06-11 LAB — CBC WITH DIFFERENTIAL/PLATELET
Abs Immature Granulocytes: 0 10*3/uL (ref 0.00–0.07)
Basophils Absolute: 0 10*3/uL (ref 0.0–0.1)
Basophils Relative: 1 %
Eosinophils Absolute: 0.1 10*3/uL (ref 0.0–0.5)
Eosinophils Relative: 2 %
HCT: 41.7 % (ref 39.0–52.0)
Hemoglobin: 14.3 g/dL (ref 13.0–17.0)
Immature Granulocytes: 0 %
Lymphocytes Relative: 34 %
Lymphs Abs: 1 10*3/uL (ref 0.7–4.0)
MCH: 29.3 pg (ref 26.0–34.0)
MCHC: 34.3 g/dL (ref 30.0–36.0)
MCV: 85.5 fL (ref 80.0–100.0)
Monocytes Absolute: 0.3 10*3/uL (ref 0.1–1.0)
Monocytes Relative: 9 %
Neutro Abs: 1.6 10*3/uL — ABNORMAL LOW (ref 1.7–7.7)
Neutrophils Relative %: 54 %
Platelets: 134 10*3/uL — ABNORMAL LOW (ref 150–400)
RBC: 4.88 MIL/uL (ref 4.22–5.81)
RDW: 12.2 % (ref 11.5–15.5)
WBC: 3 10*3/uL — ABNORMAL LOW (ref 4.0–10.5)
nRBC: 0 % (ref 0.0–0.2)

## 2019-06-12 ENCOUNTER — Encounter: Payer: Self-pay | Admitting: Oncology

## 2019-06-12 ENCOUNTER — Inpatient Hospital Stay (HOSPITAL_BASED_OUTPATIENT_CLINIC_OR_DEPARTMENT_OTHER): Payer: Medicare Other | Admitting: Oncology

## 2019-06-12 DIAGNOSIS — D61818 Other pancytopenia: Secondary | ICD-10-CM | POA: Diagnosis not present

## 2019-06-12 NOTE — Progress Notes (Signed)
Patient's caregiver-Wendy stated that she is not currently with patient. However, she provided the number for the group home. Toniann Fail stated that patient had been doing well with no complaints.

## 2019-06-14 NOTE — Progress Notes (Signed)
I connected with Bobby Dean on 06/14/19 at  1:00 PM EDT by video enabled telemedicine visit and verified that I am speaking with the correct person using two identifiers.   I discussed the limitations, risks, security and privacy concerns of performing an evaluation and management service by telemedicine and the availability of in-person appointments. I also discussed with the patient that there may be a patient responsible charge related to this service. The patient expressed understanding and agreed to proceed.  Other persons participating in the visit and their role in the encounter: Patient's caregiver at group home Bobby Dean  Patient's location: Group home Provider's location: Work  Chief Complaint: Routine follow-up of autoimmune pancytopenia  History of present illness:  Patient is a 29 year old male was then referred to [place] for evaluation and management of thrombocytopenia. He has a history of autism and intellectual visibility and currently lives in a group home for the last 7 months. His medical decision maker is Bobby Dean from [place] office.   Blood work from 04/28/2016 showed white count of 2.1 with an H&H of 15.5/45.1 and a platelet count of 51. CMP was within normal limits. TSH was normal at 1.09. At this time I do not have any prior CBCs for comparison.  Bone marrow biopsy on 05/14/2016 showed normal trilineage hematopoiesis with no evidence of lymphoma or leukemia. Peripheral flow cytometry showed reversal of CD4 to CD8 ratio. HIV hepatitis B and hepatitis C testing was negative. B12 and folate were within normal limits. PT/PTT INR was within normal limits.  Review of peripheral smear showed leukopenia and moderate neutropenia. Thrombocytopenia. RBC morphology is normal. Scattered reactive appearing lymphocytes are seen. No blasts or early forms noted.  Monospot testing was positive. ANA was negative. Quantitative immunoglobulins were within normal limits.  Serum copper was within normal limits. CMV DNA PCR negative  Patient was started on empiric high dose steroids for possible autoimmune etiology on 05/28/16. His counts normalized. Then his steroid taper was started. His WBC remained normal but his platelets began to decline 60's.   Plan was to restart full dose steroids and switch to rituxan second line after receiving pre splenectomy vaccines in anticipation of splenectomy should he not respond to rituxan  Patient has received all his pre splenectomy vaccines. He startedrituxan on 10/14/16. Seen at [facility] for second opinion by [name] who agrees with the plan.  [name] andfollowed byslow steroid taper. Steroids were stopped in September 2018  recurrent neutropenia/ TCP noted in sept 2019. rituxan and steroids restarted on 10/3/19and counts normalized.  Patient has been requiring Rituxan roughly every 8 to 12 months   Interval history : Patient is doing well at his group home.  Denies any complaints at this time.  He has received his Covid vaccine.   Review of Systems  Constitutional: Negative for chills, fever, malaise/fatigue and weight loss.  HENT: Negative for congestion, ear discharge and nosebleeds.   Eyes: Negative for blurred vision.  Respiratory: Negative for cough, hemoptysis, sputum production, shortness of breath and wheezing.   Cardiovascular: Negative for chest pain, palpitations, orthopnea and claudication.  Gastrointestinal: Negative for abdominal pain, blood in stool, constipation, diarrhea, heartburn, melena, nausea and vomiting.  Genitourinary: Negative for dysuria, flank pain, frequency, hematuria and urgency.  Musculoskeletal: Negative for back pain, joint pain and myalgias.  Skin: Negative for rash.  Neurological: Negative for dizziness, tingling, focal weakness, seizures, weakness and headaches.  Endo/Heme/Allergies: Does not bruise/bleed easily.  Psychiatric/Behavioral: Negative for depression and suicidal ideas. The patient does not have insomnia.     No Known Allergies  Past Medical History:  Diagnosis Date  . Anxiety   . Autism   . Autism   . 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 No pertinent surgical history.  Social History   Socioeconomic History  . Marital status: Single    Spouse name: Not on file  . Number of children: Not on file  . Years of education: Not on file  . Highest education level: Not on file  Occupational History  . Not on file  Tobacco Use  . Smoking status: Never Smoker  . Smokeless tobacco: Never Used  Substance and Sexual Activity  . Alcohol use: No  . Drug use: No  . Sexual activity: Never  Other Topics Concern  . Not on file  Social History Narrative  . Not on file   Social Determinants of Health   Financial Resource Strain:   . Difficulty of Paying Living Expenses:   Food Insecurity:   . Worried About Charity fundraiser in the Last Year:   . Arboriculturist in the Last Year:   Transportation Needs:   . Film/video editor (Medical):   Marland Kitchen Lack of Transportation (Non-Medical):   Physical Activity:   . Days of Exercise per Week:   . Minutes of Exercise per Session:   Stress:   . Feeling of Stress :   Social Connections:   . Frequency of Communication with Friends and Family:   . Frequency of Social Gatherings with Friends and Family:   . Attends Religious Services:   . Active Member of Clubs or Organizations:   . Attends Archivist Meetings:   Marland Kitchen Marital Status:   Intimate Partner Violence:   . Fear of Current or Ex-Partner:   . Emotionally Abused:   Marland Kitchen Physically Abused:   . Sexually Abused:     Family History  Problem Relation Age of Onset  . Cancer Mother      Current Outpatient Medications:  .  Ascorbic Acid (VITAMIN C) 1000 MG tablet, Take 1,000 mg by mouth daily., Disp: , Rfl:  .  cholecalciferol (VITAMIN D) 25 MCG (1000 UT)  tablet, Take 1 tablet by mouth daily., Disp: , Rfl:   No results found.  No images are attached to the encounter.   CMP Latest Ref Rng & Units 12/18/2018  Glucose 70 - 99 mg/dL 103(H)  BUN 6 - 20 mg/dL 13  Creatinine 0.61 - 1.24 mg/dL 0.79  Sodium 135 - 145 mmol/L 140  Potassium 3.5 - 5.1 mmol/L 3.7  Chloride 98 - 111 mmol/L 103  CO2 22 - 32 mmol/L 26  Calcium 8.9 - 10.3 mg/dL 9.0  Total Protein 6.5 - 8.1 g/dL 6.7  Total Bilirubin 0.3 - 1.2 mg/dL 0.7  Alkaline Phos 38 - 126 U/L 41  AST 15 - 41 U/L 14(L)  ALT 0 - 44 U/L 12   CBC Latest Ref Rng & Units 06/11/2019  WBC 4.0 - 10.5 K/uL 3.0(L)  Hemoglobin 13.0 - 17.0 g/dL 14.3  Hematocrit 39.0 - 52.0 % 41.7  Platelets 150 - 400 K/uL 134(L)     Observation/objective: Appears in no acute distress over video visit today.  Breathing is nonlabored  Assessment and plan: Patient is a 29 year old male with autoimmune pancytopenia for routine follow-up  Patient's white cell count is 3 today with an ANC of 1.6 which has  remained overall stable over the last 4 months.  H&H is normal and he has mild baseline thrombocytopenia with a platelet count of 134.  He does not require Rituxan at this time and I will continue to monitor his CBC every 2 months.  Follow-up instructions: Repeat CBC with differential in 2 months in 4 months and I will see him back in 4 months  I discussed the assessment and treatment plan with the patient. The patient was provided an opportunity to ask questions and all were answered. The patient agreed with the plan and demonstrated an understanding of the instructions.   The patient was advised to call back or seek an in-person evaluation if the symptoms worsen or if the condition fails to improve as anticipated.    Visit Diagnosis: 1. 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, MD, MPH [facility] at [facility] Tel- [phone] 06/14/2019 5:43 PM

## 2019-07-03 ENCOUNTER — Telehealth: Payer: Self-pay | Admitting: Oncology

## 2019-07-03 NOTE — Telephone Encounter (Signed)
MD will be out of the office on 10-15-19. Writer spoke with patient's caregiver and rescheduled patient's appts.

## 2019-08-07 ENCOUNTER — Ambulatory Visit (INDEPENDENT_AMBULATORY_CARE_PROVIDER_SITE_OTHER): Payer: Medicare Other | Admitting: Internal Medicine

## 2019-08-07 ENCOUNTER — Encounter: Payer: Self-pay | Admitting: Internal Medicine

## 2019-08-07 ENCOUNTER — Other Ambulatory Visit: Payer: Self-pay

## 2019-08-07 VITALS — BP 116/77 | HR 63 | Wt 164.0 lb

## 2019-08-07 DIAGNOSIS — F840 Autistic disorder: Secondary | ICD-10-CM | POA: Diagnosis not present

## 2019-08-07 DIAGNOSIS — D61818 Other pancytopenia: Secondary | ICD-10-CM

## 2019-08-07 NOTE — Progress Notes (Addendum)
Established Patient Office Visit  Subjective:  Patient ID: Bobby Dean, male    DOB: 05/19/1990  Age: 29 y.o. MRN: [id number]  CC:  Chief Complaint  Patient presents with  . Follow-up    patient here for follow up, he is with group home     HPI  Bobby Dean presents for patient is doing well denies chest pain nausea vomiting or shortness of breath.  He already has received Covid shot.  Past Medical History:  Diagnosis Date  . Anxiety   . Autism   . Autism   . Autoimmune pancytopenia ([facility])     No past surgical history on file.  Family History  Problem Relation Age of Onset  . Cancer Mother     Social History   Socioeconomic History  . Marital status: Single    Spouse name: Not on file  . Number of children: Not on file  . Years of education: Not on file  . Highest education level: Not on file  Occupational History  . Not on file  Tobacco Use  . Smoking status: Never Smoker  . Smokeless tobacco: Never Used  Substance and Sexual Activity  . Alcohol use: No  . Drug use: No  . Sexual activity: Never  Other Topics Concern  . Not on file  Social History Narrative  . Not on file   Social Determinants of Health   Financial Resource Strain:   . Difficulty of Paying Living Expenses:   Food Insecurity:   . Worried About Programme researcher, broadcasting/film/video in the Last Year:   . Barista in the Last Year:   Transportation Needs:   . Freight forwarder (Medical):   Marland Kitchen Lack of Transportation (Non-Medical):   Physical Activity:   . Days of Exercise per Week:   . Minutes of Exercise per Session:   Stress:   . Feeling of Stress :   Social Connections:   . Frequency of Communication with Friends and Family:   . Frequency of Social Gatherings with Friends and Family:   . Attends Religious Services:   . Active Member of Clubs or Organizations:   . Attends Banker Meetings:   Marland Kitchen Marital Status:   Intimate Partner Violence:   . Fear of Current or  Ex-Partner:   . Emotionally Abused:   Marland Kitchen Physically Abused:   . Sexually Abused:      Current Outpatient Medications:  .  Ascorbic Acid (VITAMIN C) 1000 MG tablet, Take 1,000 mg by mouth daily., Disp: , Rfl:  .  cholecalciferol (VITAMIN D) 25 MCG (1000 UT) tablet, Take 1 tablet by mouth daily., Disp: , Rfl:    No Known Allergies  ROS Review of Systems  Constitutional: Negative.   HENT: Negative.   Eyes: Negative for pain.  Respiratory: Negative for chest tightness.   Cardiovascular: Negative for chest pain.  Gastrointestinal: Negative for abdominal distention.  Endocrine: Negative.   Genitourinary: Negative.   Musculoskeletal: Negative.   Neurological: Negative.   Psychiatric/Behavioral: Negative for agitation and behavioral problems.      Objective:    Physical Exam  Constitutional: He is oriented to person, place, and time. He appears well-developed and well-nourished.  HENT:  Head: Normocephalic and atraumatic.  Mouth/Throat: Oropharynx is clear and moist.  Eyes: Pupils are equal, round, and reactive to light.  Neck: No JVD present. No tracheal deviation present.  Pulmonary/Chest: Effort normal. He has no wheezes.  Abdominal: There is no abdominal tenderness.  Musculoskeletal:        General: Normal range of motion.     Cervical back: Normal range of motion.  Lymphadenopathy:    He has no cervical adenopathy.  Neurological: He is alert and oriented to person, place, and time.    BP 116/77   Pulse 63   Wt 164 lb (74.4 kg)   BMI 21.06 kg/m  Wt Readings from Last 3 Encounters:  08/07/19 164 lb (74.4 kg)  03/19/19 174 lb (78.9 kg)  12/18/18 165 lb 6.4 oz (75 kg)     There are no preventive care reminders to display for this patient.  There are no preventive care reminders to display for this patient.  Lab Results  Component Value Date   TSH 0.809 01/19/2017   Lab Results  Component Value Date   WBC 3.0 (L) 06/11/2019   HGB 14.3 06/11/2019   HCT  41.7 06/11/2019   MCV 85.5 06/11/2019   PLT 134 (L) 06/11/2019   Lab Results  Component Value Date   NA 140 12/18/2018   K 3.7 12/18/2018   CO2 26 12/18/2018   GLUCOSE 103 (H) 12/18/2018   BUN 13 12/18/2018   CREATININE 0.79 12/18/2018   BILITOT 0.7 12/18/2018   ALKPHOS 41 12/18/2018   AST 14 (L) 12/18/2018   ALT 12 12/18/2018   PROT 6.7 12/18/2018   ALBUMIN 4.2 12/18/2018   CALCIUM 9.0 12/18/2018   ANIONGAP 11 12/18/2018   No results found for: CHOL No results found for: HDL No results found for: LDLCALC No results found for: TRIG No results found for: CHOLHDL No results found for: HGBA1C    Assessment & Plan:   Problem List Items Addressed This Visit      Hematopoietic and Hemostatic   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  Patient sees the oncology hematology every 3 months for the blood drawn.        Other   Autism - Primary    Stable.         No orders of the defined types were placed in this encounter.   1. Autism Stable. .  2. Autoimmune pancytopenia ([place]) To hematology.  Pt is non smoker Follow-up: No follow-ups on file.    Bobby Athens, MD

## 2019-08-07 NOTE — Assessment & Plan Note (Addendum)
Referred to hematology.  Patient sees the oncology hematology every 3 months for the blood drawn.

## 2019-08-07 NOTE — Assessment & Plan Note (Signed)
Stable

## 2019-08-10 NOTE — Addendum Note (Signed)
Addended by: Corky Downs on: 08/10/2019 05:38 PM   Modules accepted: Level of Service

## 2019-08-14 ENCOUNTER — Inpatient Hospital Stay: Payer: Medicare Other | Attending: Oncology

## 2019-08-14 ENCOUNTER — Other Ambulatory Visit: Payer: Self-pay

## 2019-08-14 DIAGNOSIS — D61818 Other pancytopenia: Secondary | ICD-10-CM | POA: Diagnosis present

## 2019-08-14 LAB — CBC WITH DIFFERENTIAL/PLATELET
Abs Immature Granulocytes: 0 10*3/uL (ref 0.00–0.07)
Basophils Absolute: 0 10*3/uL (ref 0.0–0.1)
Basophils Relative: 0 %
Eosinophils Absolute: 0.1 10*3/uL (ref 0.0–0.5)
Eosinophils Relative: 3 %
HCT: 40.8 % (ref 39.0–52.0)
Hemoglobin: 14 g/dL (ref 13.0–17.0)
Immature Granulocytes: 0 %
Lymphocytes Relative: 41 %
Lymphs Abs: 1.4 10*3/uL (ref 0.7–4.0)
MCH: 29.5 pg (ref 26.0–34.0)
MCHC: 34.3 g/dL (ref 30.0–36.0)
MCV: 86.1 fL (ref 80.0–100.0)
Monocytes Absolute: 0.4 10*3/uL (ref 0.1–1.0)
Monocytes Relative: 12 %
Neutro Abs: 1.6 10*3/uL — ABNORMAL LOW (ref 1.7–7.7)
Neutrophils Relative %: 44 %
Platelets: 146 10*3/uL — ABNORMAL LOW (ref 150–400)
RBC: 4.74 MIL/uL (ref 4.22–5.81)
RDW: 12.8 % (ref 11.5–15.5)
WBC: 3.5 10*3/uL — ABNORMAL LOW (ref 4.0–10.5)
nRBC: 0 % (ref 0.0–0.2)

## 2019-10-04 ENCOUNTER — Other Ambulatory Visit: Payer: Self-pay

## 2019-10-04 ENCOUNTER — Inpatient Hospital Stay: Payer: Medicare Other | Attending: Oncology

## 2019-10-04 DIAGNOSIS — D61818 Other pancytopenia: Secondary | ICD-10-CM | POA: Diagnosis not present

## 2019-10-04 DIAGNOSIS — F840 Autistic disorder: Secondary | ICD-10-CM | POA: Insufficient documentation

## 2019-10-04 DIAGNOSIS — Z9081 Acquired absence of spleen: Secondary | ICD-10-CM | POA: Insufficient documentation

## 2019-10-04 LAB — CBC WITH DIFFERENTIAL/PLATELET
Abs Immature Granulocytes: 0 10*3/uL (ref 0.00–0.07)
Basophils Absolute: 0 10*3/uL (ref 0.0–0.1)
Basophils Relative: 1 %
Eosinophils Absolute: 0.1 10*3/uL (ref 0.0–0.5)
Eosinophils Relative: 3 %
HCT: 42.4 % (ref 39.0–52.0)
Hemoglobin: 14.7 g/dL (ref 13.0–17.0)
Immature Granulocytes: 0 %
Lymphocytes Relative: 38 %
Lymphs Abs: 1.2 10*3/uL (ref 0.7–4.0)
MCH: 29.7 pg (ref 26.0–34.0)
MCHC: 34.7 g/dL (ref 30.0–36.0)
MCV: 85.7 fL (ref 80.0–100.0)
Monocytes Absolute: 0.3 10*3/uL (ref 0.1–1.0)
Monocytes Relative: 8 %
Neutro Abs: 1.6 10*3/uL — ABNORMAL LOW (ref 1.7–7.7)
Neutrophils Relative %: 50 %
Platelets: 149 10*3/uL — ABNORMAL LOW (ref 150–400)
RBC: 4.95 MIL/uL (ref 4.22–5.81)
RDW: 12 % (ref 11.5–15.5)
WBC: 3.3 10*3/uL — ABNORMAL LOW (ref 4.0–10.5)
nRBC: 0 % (ref 0.0–0.2)

## 2019-10-08 ENCOUNTER — Inpatient Hospital Stay (HOSPITAL_BASED_OUTPATIENT_CLINIC_OR_DEPARTMENT_OTHER): Payer: Medicare Other | Admitting: Oncology

## 2019-10-08 DIAGNOSIS — D61818 Other pancytopenia: Secondary | ICD-10-CM | POA: Diagnosis not present

## 2019-10-08 NOTE — Progress Notes (Signed)
I connected with Bobby Dean on 10/08/19 at  2:30 PM EDT by video enabled telemedicine visit and verified that I am speaking with the correct person using two identifiers.   I discussed the limitations, risks, security and privacy concerns of performing an evaluation and management service by telemedicine and the availability of in-person appointments. I also discussed with the patient that there may be a patient responsible charge related to this service. The patient expressed understanding and agreed to proceed.  Other persons participating in the visit and their role in the encounter:  [name] from group home  Patient's location:  Group home Provider's location:  work   Diagnosis-autoimmune pancytopenia  Higher education careers adviser Reason for visit-routine follow-up of autoimmune pancytopenia  History of present illness: Patient is a 29 year old male was then referred to [place] for evaluation and management of thrombocytopenia. He has a history of autism and intellectual visibility and currently lives in a group home for the last 7 months. His medical decision maker is [name] from [place] office.   Blood work from 04/28/2016 showed white count of 2.1 with an H&H of 15.5/45.1 and a platelet count of 51. CMP was within normal limits. TSH was normal at 1.09. At this time I do not have any prior CBCs for comparison.  Bone marrow biopsy on 05/14/2016 showed normal trilineage hematopoiesis with no evidence of lymphoma or leukemia. Peripheral flow cytometry showed reversal of CD4 to CD8 ratio. HIV hepatitis B and hepatitis C testing was negative. B12 and folate were within normal limits. PT/PTT INR was within normal limits.  Review of peripheral smear showed leukopenia and moderate neutropenia. Thrombocytopenia. RBC morphology is normal. Scattered reactive appearing lymphocytes are seen. No blasts or early forms noted.  Monospot testing was positive. ANA was negative. Quantitative  immunoglobulins were within normal limits. Serum copper was within normal limits. CMV DNA PCR negative  Patient was started on empiric high dose steroids for possible autoimmune etiology on 05/28/16. His counts normalized. Then his steroid taper was started. His WBC remained normal but his platelets began to decline 60's.   Plan was to restart full dose steroids and switch to rituxan second line after receiving pre splenectomy vaccines in anticipation of splenectomy should he not respond to rituxan  Patient has received all his pre splenectomy vaccines. He startedrituxan on 10/14/16. Seen at [facility] for second opinion by [name] who agrees with the plan.  [name] andfollowed byslow steroid taper. Steroids were stopped in September 2018  recurrent neutropenia/ TCP noted in sept 2019. rituxan and steroids restarted on 10/3/19and counts normalized.Patient has been requiring Rituxan roughly every 8 to 12 months   Interval history patient reports doing well and denies any complaints.  No history of any recurrent infections or hospitalizations.  No fever.  Patient is already taken his Covid vaccine   Review of Systems  Constitutional: Negative for chills, fever, malaise/fatigue and weight loss.  HENT: Negative for congestion, ear discharge and nosebleeds.   Eyes: Negative for blurred vision.  Respiratory: Negative for cough, hemoptysis, sputum production, shortness of breath and wheezing.   Cardiovascular: Negative for chest pain, palpitations, orthopnea and claudication.  Gastrointestinal: Negative for abdominal pain, blood in stool, constipation, diarrhea, heartburn, melena, nausea and vomiting.  Genitourinary: Negative for dysuria, flank pain, frequency, hematuria and urgency.  Musculoskeletal: Negative for back pain, joint pain and myalgias.  Skin: Negative for rash.  Neurological: Negative for dizziness, tingling, focal weakness, seizures,  weakness  and headaches.  Endo/Heme/Allergies: Does not bruise/bleed easily.  Psychiatric/Behavioral: Negative for depression and suicidal ideas. The patient does not have insomnia.     No Known Allergies  Past Medical History:  Diagnosis Date  . Anxiety   . Autism   . Autism   . Autoimmune pancytopenia (HCC)     No past surgical history on file.  Social History   Socioeconomic History  . Marital status: Single    Spouse name: Not on file  . Number of children: Not on file  . Years of education: Not on file  . Highest education level: Not on file  Occupational History  . Not on file  Tobacco Use  . Smoking status: Never Smoker  . Smokeless tobacco: Never Used  Vaping Use  . Vaping Use: Never used  Substance and Sexual Activity  . Alcohol use: No  . Drug use: No  . Sexual activity: Never  Other Topics Concern  . Not on file  Social History Narrative  . Not on file   Social Determinants of Health   Financial Resource Strain:   . Difficulty of Paying Living Expenses:   Food Insecurity:   . Worried About Charity fundraiser in the Last Year:   . Arboriculturist in the Last Year:   Transportation Needs:   . Film/video editor (Medical):   Marland Kitchen Lack of Transportation (Non-Medical):   Physical Activity:   . Days of Exercise per Week:   . Minutes of Exercise per Session:   Stress:   . Feeling of Stress :   Social Connections:   . Frequency of Communication with Friends and Family:   . Frequency of Social Gatherings with Friends and Family:   . Attends Religious Services:   . Active Member of Clubs or Organizations:   . Attends Archivist Meetings:   Marland Kitchen Marital Status:   Intimate Partner Violence:   . Fear of Current or Ex-Partner:   . Emotionally Abused:   Marland Kitchen Physically Abused:   . Sexually Abused:     Family History  Problem Relation Age of Onset  . Cancer Mother      Current Outpatient Medications:  .  Ascorbic Acid (VITAMIN C) 1000 MG  tablet, Take 1,000 mg by mouth daily., Disp: , Rfl:  .  cholecalciferol (VITAMIN D) 25 MCG (1000 UT) tablet, Take 1 tablet by mouth daily., Disp: , Rfl:   No results found.  No images are attached to the encounter.   CMP Latest Ref Rng & Units 12/18/2018  Glucose 70 - 99 mg/dL 103(H)  BUN 6 - 20 mg/dL 13  Creatinine 0.61 - 1.24 mg/dL 0.79  Sodium 135 - 145 mmol/L 140  Potassium 3.5 - 5.1 mmol/L 3.7  Chloride 98 - 111 mmol/L 103  CO2 22 - 32 mmol/L 26  Calcium 8.9 - 10.3 mg/dL 9.0  Total Protein 6.5 - 8.1 g/dL 6.7  Total Bilirubin 0.3 - 1.2 mg/dL 0.7  Alkaline Phos 38 - 126 U/L 41  AST 15 - 41 U/L 14(L)  ALT 0 - 44 U/L 12   CBC Latest Ref Rng & Units 10/04/2019  WBC 4.0 - 10.5 K/uL 3.3(L)  Hemoglobin 13.0 - 17.0 g/dL 14.7  Hematocrit 39 - 52 % 42.4  Platelets 150 - 400 K/uL 149(L)     Observation/objective: Appears in no acute distress over video visit today.  Breathing is nonlabored  Assessment and plan: Patient is a 29 year old male with autoimmune pancytopenia  who gets periodic Rituxan infusions once a year.  This is a routine follow-up visit  Patient does have mild leukopenia with an [place] that has remained stable around 1.6 over the last 6 to 8 months.  Hemoglobin is normal and platelets are mildly low.  He does not require Rituxan at this time.  In the past when he has required Rituxan he has severe thrombocytopenia as well as neutropenia.  Follow-up instructions: Repeat CBC with differential in 2 months in 4 months and I will see him back in 4 months  I discussed the assessment and treatment plan with the patient. The patient was provided an opportunity to ask questions and all were answered. The patient agreed with the plan and demonstrated an understanding of the instructions.   The patient was advised to call back or seek an in-person evaluation if the symptoms worsen or if the condition fails to improve as anticipated.  Visit Diagnosis: 1. Autoimmune pancytopenia  ([place])     [name], MD, MPH [facility] Tel- [phone] 10/08/2019 3:05 PM

## 2019-10-12 ENCOUNTER — Other Ambulatory Visit: Payer: Medicare Other

## 2019-10-15 ENCOUNTER — Telehealth: Payer: Medicare Other | Admitting: Oncology

## 2019-11-07 ENCOUNTER — Ambulatory Visit (INDEPENDENT_AMBULATORY_CARE_PROVIDER_SITE_OTHER): Payer: Medicare Other | Admitting: Internal Medicine

## 2019-11-07 ENCOUNTER — Encounter: Payer: Self-pay | Admitting: Internal Medicine

## 2019-11-07 ENCOUNTER — Other Ambulatory Visit: Payer: Self-pay

## 2019-11-07 VITALS — BP 117/79 | HR 78 | Ht 75.0 in | Wt 171.9 lb

## 2019-11-07 DIAGNOSIS — D61818 Other pancytopenia: Secondary | ICD-10-CM | POA: Diagnosis not present

## 2019-11-07 DIAGNOSIS — F840 Autistic disorder: Secondary | ICD-10-CM

## 2019-11-07 DIAGNOSIS — F419 Anxiety disorder, unspecified: Secondary | ICD-10-CM | POA: Diagnosis not present

## 2019-11-07 NOTE — Assessment & Plan Note (Signed)
Stable

## 2019-11-07 NOTE — Assessment & Plan Note (Signed)
Patient platelet count is stable WBC count is slightly low.  Hematologist on a regular basis.

## 2019-11-07 NOTE — Assessment & Plan Note (Signed)
Mentally patient is alert cooperative he is not depressed no evidence of hallucinations or delusions.

## 2019-11-07 NOTE — Progress Notes (Signed)
Established Patient Office Visit  Subjective:  Patient ID: Bobby Dean, male    DOB: 03-Jul-1990  Age: 29 y.o. MRN: [id number]  CC:  Chief Complaint  Patient presents with  . Follow-up    HPI  [name] presents for patient has a problem with autism also is known to have pancytopenia.  There is no evidence of leukemia or lymphoma or repeated blood testing his platelet count was borderline low.  He does not have any evidence of hepatitis type C.  Thyroid test was negative in the past.  Patient denies any history of chest pain shortness of breath paroxysmal nocturnal dyspnea.  Negative ANA.  Treated with steroids in the past also suggests Rituxan  Past Medical History:  Diagnosis Date  . Anxiety   . Autism   . Autism   . Autoimmune pancytopenia (HCC)     History reviewed. No pertinent surgical history.  Family History  Problem Relation Age of Onset  . Cancer Mother     Social History   Socioeconomic History  . Marital status: Single    Spouse name: Not on file  . Number of children: Not on file  . Years of education: Not on file  . Highest education level: Not on file  Occupational History  . Not on file  Tobacco Use  . Smoking status: Never Smoker  . Smokeless tobacco: Never Used  Vaping Use  . Vaping Use: Never used  Substance and Sexual Activity  . Alcohol use: No  . Drug use: No  . Sexual activity: Never  Other Topics Concern  . Not on file  Social History Narrative  . Not on file   Social Determinants of Health   Financial Resource Strain:   . Difficulty of Paying Living Expenses: Not on file  Food Insecurity:   . Worried About Programme researcher, broadcasting/film/video in the Last Year: Not on file  . Ran Out of Food in the Last Year: Not on file  Transportation Needs:   . Lack of Transportation (Medical): Not on file  . Lack of Transportation (Non-Medical): Not on file  Physical Activity:   . Days of Exercise per Week: Not on file  . Minutes of Exercise  per Session: Not on file  Stress:   . Feeling of Stress : Not on file  Social Connections:   . Frequency of Communication with Friends and Family: Not on file  . Frequency of Social Gatherings with Friends and Family: Not on file  . Attends Religious Services: Not on file  . Active Member of Clubs or Organizations: Not on file  . Attends Banker Meetings: Not on file  . Marital Status: Not on file  Intimate Partner Violence:   . Fear of Current or Ex-Partner: Not on file  . Emotionally Abused: Not on file  . Physically Abused: Not on file  . Sexually Abused: Not on file     Current Outpatient Medications:  .  Ascorbic Acid (VITAMIN C) 1000 MG tablet, Take 1,000 mg by mouth daily., Disp: , Rfl:  .  cholecalciferol (VITAMIN D) 25 MCG (1000 UT) tablet, Take 1 tablet by mouth daily., Disp: , Rfl:    No Known Allergies  ROS Review of Systems  Constitutional: Negative.   HENT: Negative.   Eyes: Negative.   Respiratory: Negative.   Cardiovascular: Negative.   Gastrointestinal: Negative.   Endocrine: Negative.   Genitourinary: Negative.   Musculoskeletal: Negative.   Skin: Negative.   Allergic/Immunologic: Negative.  Neurological: Negative.   Hematological: Negative.   Psychiatric/Behavioral: Negative.   All other systems reviewed and are negative.     Objective:    Physical Exam Vitals reviewed.  Constitutional:      Appearance: Normal appearance.  HENT:     Mouth/Throat:     Mouth: Mucous membranes are moist.  Eyes:     Pupils: Pupils are equal, round, and reactive to light.  Neck:     Vascular: No carotid bruit.  Cardiovascular:     Rate and Rhythm: Normal rate and regular rhythm.     Pulses: Normal pulses.     Heart sounds: Normal heart sounds.  Pulmonary:     Effort: Pulmonary effort is normal.     Breath sounds: Normal breath sounds.  Abdominal:     General: Bowel sounds are normal.     Palpations: Abdomen is soft. There is no  hepatomegaly, splenomegaly or mass.     Tenderness: There is no abdominal tenderness.     Hernia: No hernia is present.  Musculoskeletal:     Cervical back: Neck supple.     Right lower leg: No edema.     Left lower leg: No edema.  Skin:    Findings: No rash.  Neurological:     Mental Status: He is alert and oriented to person, place, and time.     Motor: No weakness.  Psychiatric:        Mood and Affect: Mood normal.        Behavior: Behavior normal.     BP 117/79   Pulse 78   Ht 6\' 3"  (1.905 m)   Wt 171 lb 14.4 oz (78 kg)   BMI 21.49 kg/m  Wt Readings from Last 3 Encounters:  11/07/19 171 lb 14.4 oz (78 kg)  08/07/19 164 lb (74.4 kg)  03/19/19 174 lb (78.9 kg)     Health Maintenance Due  Topic Date Due  . INFLUENZA VACCINE  10/14/2019    There are no preventive care reminders to display for this patient.  Lab Results  Component Value Date   TSH 0.809 01/19/2017   Lab Results  Component Value Date   WBC 3.3 (L) 10/04/2019   HGB 14.7 10/04/2019   HCT 42.4 10/04/2019   MCV 85.7 10/04/2019   PLT 149 (L) 10/04/2019   Lab Results  Component Value Date   NA 140 12/18/2018   K 3.7 12/18/2018   CO2 26 12/18/2018   GLUCOSE 103 (H) 12/18/2018   BUN 13 12/18/2018   CREATININE 0.79 12/18/2018   BILITOT 0.7 12/18/2018   ALKPHOS 41 12/18/2018   AST 14 (L) 12/18/2018   ALT 12 12/18/2018   PROT 6.7 12/18/2018   ALBUMIN 4.2 12/18/2018   CALCIUM 9.0 12/18/2018   ANIONGAP 11 12/18/2018   No results found for: CHOL No results found for: HDL No results found for: LDLCALC No results found for: TRIG No results found for: CHOLHDL No results found for: 02/17/2019    Assessment & Plan:   Problem List Items Addressed This Visit      Hematopoietic and Hemostatic   Autoimmune pancytopenia (HCC)    Patient platelet count is stable WBC count is slightly low.  Hematologist on a regular basis.        Other   Autism - Primary    Mentally patient is alert cooperative  he is not depressed no evidence of hallucinations or delusions.      Anxiety    Stable.  No orders of the defined types were placed in this encounter.   Follow-up: No follow-ups on file.    [name], MD

## 2019-12-03 ENCOUNTER — Other Ambulatory Visit: Payer: Self-pay | Admitting: Internal Medicine

## 2019-12-10 ENCOUNTER — Inpatient Hospital Stay: Payer: Medicare Other

## 2019-12-10 ENCOUNTER — Other Ambulatory Visit: Payer: Self-pay

## 2019-12-10 ENCOUNTER — Inpatient Hospital Stay: Payer: Medicare Other | Attending: Oncology

## 2019-12-10 DIAGNOSIS — D61818 Other pancytopenia: Secondary | ICD-10-CM | POA: Diagnosis not present

## 2019-12-10 LAB — CBC WITH DIFFERENTIAL/PLATELET
Abs Immature Granulocytes: 0 10*3/uL (ref 0.00–0.07)
Basophils Absolute: 0 10*3/uL (ref 0.0–0.1)
Basophils Relative: 1 %
Eosinophils Absolute: 0.1 10*3/uL (ref 0.0–0.5)
Eosinophils Relative: 2 %
HCT: 41.4 % (ref 39.0–52.0)
Hemoglobin: 14.3 g/dL (ref 13.0–17.0)
Immature Granulocytes: 0 %
Lymphocytes Relative: 33 %
Lymphs Abs: 1.2 10*3/uL (ref 0.7–4.0)
MCH: 29.4 pg (ref 26.0–34.0)
MCHC: 34.5 g/dL (ref 30.0–36.0)
MCV: 85 fL (ref 80.0–100.0)
Monocytes Absolute: 0.3 10*3/uL (ref 0.1–1.0)
Monocytes Relative: 8 %
Neutro Abs: 2.1 10*3/uL (ref 1.7–7.7)
Neutrophils Relative %: 56 %
Platelets: 120 10*3/uL — ABNORMAL LOW (ref 150–400)
RBC: 4.87 MIL/uL (ref 4.22–5.81)
RDW: 12.5 % (ref 11.5–15.5)
WBC: 3.6 10*3/uL — ABNORMAL LOW (ref 4.0–10.5)
nRBC: 0 % (ref 0.0–0.2)

## 2019-12-14 ENCOUNTER — Other Ambulatory Visit: Payer: Self-pay | Admitting: Internal Medicine

## 2020-01-07 ENCOUNTER — Other Ambulatory Visit: Payer: Self-pay

## 2020-01-07 ENCOUNTER — Ambulatory Visit (INDEPENDENT_AMBULATORY_CARE_PROVIDER_SITE_OTHER): Payer: Medicare Other

## 2020-01-07 DIAGNOSIS — Z23 Encounter for immunization: Secondary | ICD-10-CM | POA: Diagnosis not present

## 2020-01-22 ENCOUNTER — Other Ambulatory Visit: Payer: Self-pay

## 2020-01-22 ENCOUNTER — Ambulatory Visit (INDEPENDENT_AMBULATORY_CARE_PROVIDER_SITE_OTHER): Payer: Medicare Other | Admitting: Internal Medicine

## 2020-01-22 ENCOUNTER — Encounter: Payer: Self-pay | Admitting: Internal Medicine

## 2020-01-22 VITALS — BP 121/84 | HR 61 | Ht 75.0 in | Wt 177.3 lb

## 2020-01-22 DIAGNOSIS — F840 Autistic disorder: Secondary | ICD-10-CM

## 2020-01-22 DIAGNOSIS — Z23 Encounter for immunization: Secondary | ICD-10-CM

## 2020-01-22 DIAGNOSIS — F419 Anxiety disorder, unspecified: Secondary | ICD-10-CM | POA: Diagnosis not present

## 2020-01-22 DIAGNOSIS — D61818 Other pancytopenia: Secondary | ICD-10-CM | POA: Diagnosis not present

## 2020-01-22 DIAGNOSIS — Z0000 Encounter for general adult medical examination without abnormal findings: Secondary | ICD-10-CM | POA: Diagnosis not present

## 2020-01-22 NOTE — Progress Notes (Signed)
Established Patient Office Visit  Subjective:  Patient ID: Bobby Dean, male    DOB: 18-Jun-1990  Age: 29 y.o. MRN: [id number]  CC: No chief complaint on file.   HPI  Bobby Dean presents for annual check  Past Medical History:  Diagnosis Date  . Anxiety   . Autism   . Autism   . Autoimmune pancytopenia (HCC)     History reviewed. No pertinent surgical history.  Family History  Problem Relation Age of Onset  . Cancer Mother     Social History   Socioeconomic History  . Marital status: Single    Spouse name: Not on file  . Number of children: Not on file  . Years of education: Not on file  . Highest education level: Not on file  Occupational History  . Not on file  Tobacco Use  . Smoking status: Never Smoker  . Smokeless tobacco: Never Used  Vaping Use  . Vaping Use: Never used  Substance and Sexual Activity  . Alcohol use: No  . Drug use: No  . Sexual activity: Never  Other Topics Concern  . Not on file  Social History Narrative  . Not on file   Social Determinants of Health   Financial Resource Strain:   . Difficulty of Paying Living Expenses: Not on file  Food Insecurity:   . Worried About Programme researcher, broadcasting/film/video in the Last Year: Not on file  . Ran Out of Food in the Last Year: Not on file  Transportation Needs:   . Lack of Transportation (Medical): Not on file  . Lack of Transportation (Non-Medical): Not on file  Physical Activity:   . Days of Exercise per Week: Not on file  . Minutes of Exercise per Session: Not on file  Stress:   . Feeling of Stress : Not on file  Social Connections:   . Frequency of Communication with Friends and Family: Not on file  . Frequency of Social Gatherings with Friends and Family: Not on file  . Attends Religious Services: Not on file  . Active Member of Clubs or Organizations: Not on file  . Attends Banker Meetings: Not on file  . Marital Status: Not on file  Intimate Partner Violence:     . Fear of Current or Ex-Partner: Not on file  . Emotionally Abused: Not on file  . Physically Abused: Not on file  . Sexually Abused: Not on file     Current Outpatient Medications:  .  ascorbic acid (GNP VITAMIN C) 500 MG tablet, Take 1 tablet by mouth daily, Disp: 30 tablet, Rfl: 0 .  Ascorbic Acid (VITAMIN C) 1000 MG tablet, Take 1,000 mg by mouth daily., Disp: , Rfl:  .  cholecalciferol (VITAMIN D) 25 MCG (1000 UNIT) tablet, TAKE 1 TABLET BY MOUTH ONCE DAILY., Disp: 30 tablet, Rfl: 0   No Known Allergies  ROS Review of Systems  Constitutional: Negative.   HENT: Negative.   Eyes: Negative.   Respiratory: Negative.   Cardiovascular: Negative.   Gastrointestinal: Negative.   Endocrine: Negative.   Genitourinary: Negative.   Musculoskeletal: Negative.   Skin: Negative.   Allergic/Immunologic: Negative.   Neurological: Negative.   Hematological: Negative.   Psychiatric/Behavioral: Negative.   All other systems reviewed and are negative.     Objective:    Physical Exam Vitals reviewed.  Constitutional:      Appearance: Normal appearance.  HENT:     Mouth/Throat:     Mouth: Mucous membranes  are moist.  Eyes:     Pupils: Pupils are equal, round, and reactive to light.  Neck:     Vascular: No carotid bruit.  Cardiovascular:     Rate and Rhythm: Normal rate and regular rhythm.     Pulses: Normal pulses.     Heart sounds: Normal heart sounds.  Pulmonary:     Effort: Pulmonary effort is normal.     Breath sounds: Normal breath sounds.  Abdominal:     General: Bowel sounds are normal.     Palpations: Abdomen is soft. There is no hepatomegaly, splenomegaly or mass.     Tenderness: There is no abdominal tenderness.     Hernia: No hernia is present.  Musculoskeletal:     Cervical back: Neck supple.     Right lower leg: No edema.     Left lower leg: No edema.  Skin:    Findings: No rash.  Neurological:     Mental Status: He is alert and oriented to person,  place, and time.     Motor: No weakness.  Psychiatric:        Mood and Affect: Mood normal.        Behavior: Behavior normal.     BP 121/84   Pulse 61   Ht 6\' 3"  (1.905 m)   Wt 177 lb 4.8 oz (80.4 kg)   BMI 22.16 kg/m  Wt Readings from Last 3 Encounters:  01/22/20 177 lb 4.8 oz (80.4 kg)  11/07/19 171 lb 14.4 oz (78 kg)  08/07/19 164 lb (74.4 kg)     There are no preventive care reminders to display for this patient.  There are no preventive care reminders to display for this patient.  Lab Results  Component Value Date   TSH 0.809 01/19/2017   Lab Results  Component Value Date   WBC 3.6 (L) 12/10/2019   HGB 14.3 12/10/2019   HCT 41.4 12/10/2019   MCV 85.0 12/10/2019   PLT 120 (L) 12/10/2019   Lab Results  Component Value Date   NA 140 12/18/2018   K 3.7 12/18/2018   CO2 26 12/18/2018   GLUCOSE 103 (H) 12/18/2018   BUN 13 12/18/2018   CREATININE 0.79 12/18/2018   BILITOT 0.7 12/18/2018   ALKPHOS 41 12/18/2018   AST 14 (L) 12/18/2018   ALT 12 12/18/2018   PROT 6.7 12/18/2018   ALBUMIN 4.2 12/18/2018   CALCIUM 9.0 12/18/2018   ANIONGAP 11 12/18/2018   No results found for: CHOL No results found for: HDL No results found for: LDLCALC No results found for: TRIG No results found for: CHOLHDL No results found for: 02/17/2019    Assessment & Plan:   Problem List Items Addressed This Visit      Hematopoietic and Hemostatic   Autoimmune pancytopenia (HCC)    Patient is being followed up on a regular basis by hematologist.        Other   Autism    There is no worsening of patient preexistent autism.  He does not have any abnormal behavior depression hallucination or bipolar ideas      Annual physical exam    Physical examination is normal HEENT i is normal, neck is supple there is no lymphadenopathy.  There is no splenomegaly there is no rash      Anxiety    Anxiety is under control.      Need for COVID-19 vaccine - Primary    Patient has been  immunized against Covid.  Relevant Orders   Moderna Covid-19 Booster (Completed)      No orders of the defined types were placed in this encounter.   Follow-up: No follow-ups on file.    Corky Downs, MD

## 2020-01-22 NOTE — Assessment & Plan Note (Signed)
Physical examination is normal HEENT i is normal, neck is supple there is no lymphadenopathy.  There is no splenomegaly there is no rash

## 2020-01-22 NOTE — Assessment & Plan Note (Signed)
Patient is being followed up on a regular basis by hematologist.

## 2020-01-22 NOTE — Assessment & Plan Note (Signed)
Patient has been immunized against Covid.

## 2020-01-22 NOTE — Assessment & Plan Note (Signed)
Anxiety is under control.

## 2020-01-22 NOTE — Assessment & Plan Note (Signed)
There is no worsening of patient preexistent autism.  He does not have any abnormal behavior depression hallucination or bipolar ideas

## 2020-02-09 ENCOUNTER — Other Ambulatory Visit: Payer: Self-pay | Admitting: Internal Medicine

## 2020-02-11 ENCOUNTER — Ambulatory Visit: Payer: Medicare Other | Admitting: Oncology

## 2020-02-11 ENCOUNTER — Other Ambulatory Visit: Payer: Medicare Other

## 2020-02-12 ENCOUNTER — Inpatient Hospital Stay: Payer: Medicare Other

## 2020-02-12 ENCOUNTER — Inpatient Hospital Stay: Payer: Medicare Other | Attending: Oncology | Admitting: Oncology

## 2020-02-12 ENCOUNTER — Encounter: Payer: Self-pay | Admitting: Oncology

## 2020-02-12 ENCOUNTER — Other Ambulatory Visit: Payer: Self-pay

## 2020-02-12 ENCOUNTER — Telehealth: Payer: Self-pay | Admitting: Oncology

## 2020-02-12 DIAGNOSIS — Z5181 Encounter for therapeutic drug level monitoring: Secondary | ICD-10-CM | POA: Diagnosis not present

## 2020-02-12 DIAGNOSIS — F79 Unspecified intellectual disabilities: Secondary | ICD-10-CM | POA: Diagnosis not present

## 2020-02-12 DIAGNOSIS — Z79620 Long term (current) use of immunosuppressive biologic: Secondary | ICD-10-CM

## 2020-02-12 DIAGNOSIS — Z79899 Other long term (current) drug therapy: Secondary | ICD-10-CM | POA: Diagnosis not present

## 2020-02-12 DIAGNOSIS — D61818 Other pancytopenia: Secondary | ICD-10-CM

## 2020-02-12 DIAGNOSIS — F419 Anxiety disorder, unspecified: Secondary | ICD-10-CM | POA: Insufficient documentation

## 2020-02-12 DIAGNOSIS — F840 Autistic disorder: Secondary | ICD-10-CM | POA: Insufficient documentation

## 2020-02-12 LAB — CBC WITH DIFFERENTIAL/PLATELET
Abs Immature Granulocytes: 0.01 10*3/uL (ref 0.00–0.07)
Basophils Absolute: 0 10*3/uL (ref 0.0–0.1)
Basophils Relative: 1 %
Eosinophils Absolute: 0.1 10*3/uL (ref 0.0–0.5)
Eosinophils Relative: 3 %
HCT: 44.2 % (ref 39.0–52.0)
Hemoglobin: 14.8 g/dL (ref 13.0–17.0)
Immature Granulocytes: 0 %
Lymphocytes Relative: 35 %
Lymphs Abs: 1.1 10*3/uL (ref 0.7–4.0)
MCH: 28.7 pg (ref 26.0–34.0)
MCHC: 33.5 g/dL (ref 30.0–36.0)
MCV: 85.8 fL (ref 80.0–100.0)
Monocytes Absolute: 0.5 10*3/uL (ref 0.1–1.0)
Monocytes Relative: 15 %
Neutro Abs: 1.5 10*3/uL — ABNORMAL LOW (ref 1.7–7.7)
Neutrophils Relative %: 46 %
Platelets: 38 10*3/uL — ABNORMAL LOW (ref 150–400)
RBC: 5.15 MIL/uL (ref 4.22–5.81)
RDW: 12.5 % (ref 11.5–15.5)
WBC: 3.2 10*3/uL — ABNORMAL LOW (ref 4.0–10.5)
nRBC: 0 % (ref 0.0–0.2)

## 2020-02-12 MED ORDER — SULFAMETHOXAZOLE-TRIMETHOPRIM 800-160 MG PO TABS: 1.0000 | ORAL_TABLET | ORAL | 0 refills | Status: DC

## 2020-02-12 MED ORDER — PANTOPRAZOLE SODIUM 20 MG PO TBEC: 20.0000 mg | DELAYED_RELEASE_TABLET | Freq: Every day | ORAL | 2 refills | Status: DC

## 2020-02-12 MED ORDER — PREDNISONE 50 MG PO TABS: ORAL_TABLET | ORAL | 0 refills | Status: DC

## 2020-02-12 NOTE — Telephone Encounter (Signed)
Pt Guardian has changed. Toniann Fail asked that Cordelia Pen give her a call for new information for consent.

## 2020-02-12 NOTE — Progress Notes (Signed)
Pt doing well, no concerns. No fevers, infections or any sickness since last time he was seen.

## 2020-02-13 ENCOUNTER — Telehealth: Payer: Self-pay | Admitting: *Deleted

## 2020-02-13 NOTE — Telephone Encounter (Signed)
I called pharmacy and spoke to [place]. We will taper him but his plt were 38 and he can't start rituxan til 12/10 and once the medicine starts helping then we will send in another prescription with tapering steroids. Greig Castilla is ok with this.

## 2020-02-13 NOTE — Telephone Encounter (Signed)
Called and spoke to wendy about the patient has a new guardian [place]. Her name is Bobby Dean.  Her email is : [email or web address] Fax # [phone] Phone# [phone]  I called Bobby and she gave me verbal consent and I just need to fax her the consent and she wanted last notes from MD and she will take care of it tomorrow.  She wanted to know what is the next treatment if the rituxan stops working for this pt. I told her that I will ask her tomorrow because Dr. Smith Robert does not work on Wednesday. She is agreeable to getting the next step on another day. I faxed the papers to her already this afternoon.

## 2020-02-13 NOTE — Telephone Encounter (Signed)
I called wendy and got info on the new guardian for pt.

## 2020-02-13 NOTE — Telephone Encounter (Signed)
Bobby Dean, [facility] Pharmacist called asking about the prescription for Prednisone 50 mg and if he will need a tapering dose after 21 days of Prednisone 50 mg. Please return his call [phone]

## 2020-02-13 NOTE — Progress Notes (Signed)
Hematology/Oncology Consult note [facility]  Telephone:([phone] [phone]  Patient Care Team: [name], MD as PCP - General (Internal Medicine) Sindy Guadeloupe, MD as Consulting Physician (Hematology and Oncology)   Name of the patient: Bobby Dean  [id number]  1990/10/23   Date of visit: 02/13/20  Diagnosis- autoimmune neutropenia and thrombocytopenia  Chief complaint/ Reason for visit-routine follow-up of autoimmune pancytopenia  Heme/Onc history:  Patient is a 29 year old male was then referred to [place] for evaluation and management of thrombocytopenia. He has a history of autism and intellectual visibility and currently lives in a group home for the last 7 months. His medical decision maker is [name] from [place] office.   Blood work from 04/28/2016 showed white count of 2.1 with an H&H of 15.5/45.1 and a platelet count of 51. CMP was within normal limits. TSH was normal at 1.09. At this time I do not have any prior CBCs for comparison.  Bone marrow biopsy on 05/14/2016 showed normal trilineage hematopoiesis with no evidence of lymphoma or leukemia. Peripheral flow cytometry showed reversal of CD4 to CD8 ratio. HIV hepatitis B and hepatitis C testing was negative. B12 and folate were within normal limits. PT/PTT INR was within normal limits.  Review of peripheral smear showed leukopenia and moderate neutropenia. Thrombocytopenia. RBC morphology is normal. Scattered reactive appearing lymphocytes are seen. No blasts or early forms noted.  Monospot testing was positive. ANA was negative. Quantitative immunoglobulins were within normal limits. Serum copper was within normal limits. CMV DNA PCR negative  Patient was started on empiric high dose steroids for possible autoimmune etiology on 05/28/16. His counts normalized. Then his steroid taper was started. His WBC remained normal but his platelets began to decline  60's.   Plan was to restart full dose steroids and switch to rituxan second line after receiving pre splenectomy vaccines in anticipation of splenectomy should he not respond to rituxan  Patient has received all his pre splenectomy vaccines. He startedrituxan on 10/14/16. Seen at [facility] for second opinion by [name] who agrees with the plan.  [name] andfollowed byslow steroid taper. Steroids were stopped in September 2018  recurrent neutropenia/ TCP noted in sept 2019. rituxan and steroids restarted on 10/3/19and counts normalized.  Patient has been requiring Rituxan roughly every 8 to 12 months   Interval history-patient reports doing well and denies any complaints at this time.  Appetite and weight have remained stable.  Denies any drenching night sweats.  Denies any infections or hospitalizations.  He has received his Covid booster shot as well.  ECOG PS- 0 Pain scale- 0   Review of systems- Review of Systems  Constitutional: Negative for chills, fever, malaise/fatigue and weight loss.  HENT: Negative for congestion, ear discharge and nosebleeds.   Eyes: Negative for blurred vision.  Respiratory: Negative for cough, hemoptysis, sputum production, shortness of breath and wheezing.   Cardiovascular: Negative for chest pain, palpitations, orthopnea and claudication.  Gastrointestinal: Negative for abdominal pain, blood in stool, constipation, diarrhea, heartburn, melena, nausea and vomiting.  Genitourinary: Negative for dysuria, flank pain, frequency, hematuria and urgency.  Musculoskeletal: Negative for back pain, joint pain and myalgias.  Skin: Negative for rash.  Neurological: Negative for dizziness, tingling, focal weakness, seizures, weakness and headaches.  Endo/Heme/Allergies: Does not bruise/bleed easily.  Psychiatric/Behavioral: Negative for depression and suicidal ideas. The patient does not have insomnia.      No Known  Allergies  Past Medical History:  Diagnosis Date  . Anxiety   . Autism   . Autism   . 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 No pertinent surgical history.  Social History   Socioeconomic History  . Marital status: Single    Spouse name: Not on file  . Number of children: Not on file  . Years of education: Not on file  . Highest education level: Not on file  Occupational History  . Not on file  Tobacco Use  . Smoking status: Never Smoker  . Smokeless tobacco: Never Used  Vaping Use  . Vaping Use: Never used  Substance and Sexual Activity  . Alcohol use: No  . Drug use: No  . Sexual activity: Never  Other Topics Concern  . Not on file  Social History Narrative  . Not on file   Social Determinants of Health   Financial Resource Strain:   . Difficulty of Paying Living Expenses: Not on file  Food Insecurity:   . Worried About Charity fundraiser in the Last Year: Not on file  . Ran Out of Food in the Last Year: Not on file  Transportation Needs:   . Lack of Transportation (Medical): Not on file  . Lack of Transportation (Non-Medical): Not on file  Physical Activity:   . Days of Exercise per Week: Not on file  . Minutes of Exercise per Session: Not on file  Stress:   . Feeling of Stress : Not on file  Social Connections:   . Frequency of Communication with Friends and Family: Not on file  . Frequency of Social Gatherings with Friends and Family: Not on file  . Attends Religious Services: Not on file  . Active Member of Clubs or Organizations: Not on file  . Attends Archivist Meetings: Not on file  . Marital Status: Not on file  Intimate Partner Violence:   . Fear of Current or Ex-Partner: Not on file  . Emotionally Abused: Not on file  . Physically Abused: Not on file  . Sexually Abused: Not on file    Family History  Problem Relation Age of Onset  . Cancer Mother      Current Outpatient Medications:  .  ascorbic acid (GNP  VITAMIN C) 500 MG tablet, Take 1 tablet by mouth daily, Disp: 30 tablet, Rfl: 0 .  cholecalciferol (VITAMIN D) 25 MCG (1000 UNIT) tablet, TAKE 1 TABLET BY MOUTH ONCE DAILY., Disp: 30 tablet, Rfl: 0 .  pantoprazole (PROTONIX) 20 MG tablet, Take 1 tablet (20 mg total) by mouth daily., Disp: 30 tablet, Rfl: 2 .  predniSONE (DELTASONE) 50 MG tablet, 1 tablet daily with food, Disp: 21 tablet, Rfl: 0 .  sulfamethoxazole-trimethoprim (BACTRIM DS) 800-160 MG tablet, Take 1 tablet by mouth 3 (three) times a week., Disp: 15 tablet, Rfl: 0  Physical exam: There were no vitals filed for this visit. Physical Exam Constitutional:      General: He is not in acute distress. Eyes:     Pupils: Pupils are equal, round, and reactive to light.  Cardiovascular:     Rate and Rhythm: Normal rate and regular rhythm.     Heart sounds: Normal heart sounds.  Pulmonary:     Effort: Pulmonary effort is normal.     Breath sounds: Normal breath sounds.  Abdominal:     General: Bowel sounds are normal.     Palpations: Abdomen is soft.     Comments: No palpable splenomegaly  Lymphadenopathy:     Comments: No palpable cervical, supraclavicular, axillary or inguinal adenopathy   Skin:    General: Skin is warm and dry.  Neurological:     Mental Status: He is alert and oriented to person, place, and time.      CMP Latest Ref Rng & Units 12/18/2018  Glucose 70 - 99 mg/dL 103(H)  BUN 6 - 20 mg/dL 13  Creatinine 0.61 - 1.24 mg/dL 0.79  Sodium 135 - 145 mmol/L 140  Potassium 3.5 - 5.1 mmol/L 3.7  Chloride 98 - 111 mmol/L 103  CO2 22 - 32 mmol/L 26  Calcium 8.9 - 10.3 mg/dL 9.0  Total Protein 6.5 - 8.1 g/dL 6.7  Total Bilirubin 0.3 - 1.2 mg/dL 0.7  Alkaline Phos 38 - 126 U/L 41  AST 15 - 41 U/L 14(L)  ALT 0 - 44 U/L 12   CBC Latest Ref Rng & Units 02/12/2020  WBC 4.0 - 10.5 K/uL 3.2(L)  Hemoglobin 13.0 - 17.0 g/dL 14.8  Hematocrit 39 - 52 % 44.2  Platelets 150 - 400 K/uL 38(L)    Assessment and plan-  Patient is a 29 y.o. male with autoimmune pancytopenia here for routine follow-up  Patient received steroids and Rituxan a year ago in August 2020 when his platelet counts dropped to 29.  After receiving steroids and Rituxan his platelet count has improved significantly and have remained stable between 120s to 180s.  Today it has dropped down to 38.  Hemoglobin has always been normal.  He also has neutropenia which is chronic which responds again to steroids and Rituxan but tends to drift down subsequently and remained stable around 1.5.    Given his significant thrombocytopenia I will proceed with weekly Rituxan x4 which he will likely receive in 1 to 2 weeks depending on how soon we can get him scheduled for it.  In the meanwhile he will start on prednisone 50 mg once a day without taper which she will continue to take until third dose of Rituxan and following that we will plan to taper him off steroids slowly.  While he is on steroids he will also remain on Bactrim prophylaxis and Protonix.  He has tolerated Rituxan well in the past without any significant side effects.  Patient and his caregiver [name] verbalized understanding.  Visit Diagnosis 1. Visit for monitoring Rituxan therapy   2. 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, MD, MPH [facility] at [facility] [phone] 02/13/2020 10:49 AM

## 2020-02-22 ENCOUNTER — Inpatient Hospital Stay: Payer: Medicare Other

## 2020-02-22 ENCOUNTER — Inpatient Hospital Stay: Payer: Medicare Other | Attending: Oncology

## 2020-02-22 ENCOUNTER — Ambulatory Visit: Payer: Medicare Other

## 2020-02-22 ENCOUNTER — Other Ambulatory Visit: Payer: Self-pay | Admitting: Oncology

## 2020-02-22 VITALS — BP 144/92 | HR 92 | Temp 97.4°F

## 2020-02-22 DIAGNOSIS — Z79620 Long term (current) use of immunosuppressive biologic: Secondary | ICD-10-CM

## 2020-02-22 DIAGNOSIS — Z809 Family history of malignant neoplasm, unspecified: Secondary | ICD-10-CM | POA: Diagnosis not present

## 2020-02-22 DIAGNOSIS — D61818 Other pancytopenia: Secondary | ICD-10-CM

## 2020-02-22 DIAGNOSIS — F840 Autistic disorder: Secondary | ICD-10-CM | POA: Insufficient documentation

## 2020-02-22 DIAGNOSIS — D704 Cyclic neutropenia: Secondary | ICD-10-CM | POA: Insufficient documentation

## 2020-02-22 DIAGNOSIS — D693 Immune thrombocytopenic purpura: Secondary | ICD-10-CM | POA: Diagnosis not present

## 2020-02-22 DIAGNOSIS — F419 Anxiety disorder, unspecified: Secondary | ICD-10-CM | POA: Diagnosis not present

## 2020-02-22 DIAGNOSIS — Z5112 Encounter for antineoplastic immunotherapy: Secondary | ICD-10-CM | POA: Diagnosis not present

## 2020-02-22 DIAGNOSIS — Z79899 Other long term (current) drug therapy: Secondary | ICD-10-CM | POA: Insufficient documentation

## 2020-02-22 DIAGNOSIS — Z5181 Encounter for therapeutic drug level monitoring: Secondary | ICD-10-CM

## 2020-02-22 LAB — COMPREHENSIVE METABOLIC PANEL
ALT: 13 U/L (ref 0–44)
AST: 13 U/L — ABNORMAL LOW (ref 15–41)
Albumin: 4.2 g/dL (ref 3.5–5.0)
Alkaline Phosphatase: 43 U/L (ref 38–126)
Anion gap: 9 (ref 5–15)
BUN: 17 mg/dL (ref 6–20)
CO2: 29 mmol/L (ref 22–32)
Calcium: 9.1 mg/dL (ref 8.9–10.3)
Chloride: 101 mmol/L (ref 98–111)
Creatinine, Ser: 0.67 mg/dL (ref 0.61–1.24)
GFR, Estimated: >60 mL/min (ref >60)
Glucose, Bld: 96 mg/dL (ref 70–99)
Potassium: 3.9 mmol/L (ref 3.5–5.1)
Sodium: 139 mmol/L (ref 135–145)
Total Bilirubin: 0.7 mg/dL (ref 0.3–1.2)
Total Protein: 6.8 g/dL (ref 6.5–8.1)

## 2020-02-22 LAB — CBC WITH DIFFERENTIAL/PLATELET
Abs Immature Granulocytes: 0.01 10*3/uL (ref 0.00–0.07)
Basophils Absolute: 0 10*3/uL (ref 0.0–0.1)
Basophils Relative: 0 %
Eosinophils Absolute: 0 10*3/uL (ref 0.0–0.5)
Eosinophils Relative: 0 %
HCT: 41.6 % (ref 39.0–52.0)
Hemoglobin: 14.3 g/dL (ref 13.0–17.0)
Immature Granulocytes: 0 %
Lymphocytes Relative: 21 %
Lymphs Abs: 1 10*3/uL (ref 0.7–4.0)
MCH: 29.6 pg (ref 26.0–34.0)
MCHC: 34.4 g/dL (ref 30.0–36.0)
MCV: 86.1 fL (ref 80.0–100.0)
Monocytes Absolute: 0.4 10*3/uL (ref 0.1–1.0)
Monocytes Relative: 8 %
Neutro Abs: 3.2 10*3/uL (ref 1.7–7.7)
Neutrophils Relative %: 71 %
Platelets: 54 10*3/uL — ABNORMAL LOW (ref 150–400)
RBC: 4.83 MIL/uL (ref 4.22–5.81)
RDW: 12.9 % (ref 11.5–15.5)
WBC: 4.6 10*3/uL (ref 4.0–10.5)
nRBC: 0 % (ref 0.0–0.2)

## 2020-02-22 MED ORDER — SODIUM CHLORIDE 0.9 % IV SOLN
375.0000 mg/m2 | Freq: Once | INTRAVENOUS | Status: AC
  Administered 2020-02-22: 800 mg via INTRAVENOUS
  Filled 2020-02-22: qty 50

## 2020-02-22 MED ORDER — DIPHENHYDRAMINE HCL 25 MG PO CAPS
50.0000 mg | ORAL_CAPSULE | Freq: Once | ORAL | Status: AC
  Administered 2020-02-22: 50 mg via ORAL
  Filled 2020-02-22: qty 2

## 2020-02-22 MED ORDER — ACETAMINOPHEN 325 MG PO TABS
650.0000 mg | ORAL_TABLET | Freq: Once | ORAL | Status: AC
  Administered 2020-02-22: 650 mg via ORAL
  Filled 2020-02-22: qty 2

## 2020-02-22 MED ORDER — SODIUM CHLORIDE 0.9 % IV SOLN
Freq: Once | INTRAVENOUS | Status: AC
  Filled 2020-02-22: qty 250

## 2020-02-29 ENCOUNTER — Other Ambulatory Visit: Payer: Self-pay

## 2020-02-29 ENCOUNTER — Inpatient Hospital Stay (HOSPITAL_BASED_OUTPATIENT_CLINIC_OR_DEPARTMENT_OTHER): Payer: Medicare Other | Admitting: Oncology

## 2020-02-29 ENCOUNTER — Encounter: Payer: Self-pay | Admitting: Oncology

## 2020-02-29 ENCOUNTER — Inpatient Hospital Stay: Payer: Medicare Other

## 2020-02-29 VITALS — BP 127/86 | HR 67 | Temp 98.6°F | Resp 18 | Ht 75.0 in | Wt 183.9 lb

## 2020-02-29 DIAGNOSIS — Z5181 Encounter for therapeutic drug level monitoring: Secondary | ICD-10-CM | POA: Diagnosis not present

## 2020-02-29 DIAGNOSIS — Z79899 Other long term (current) drug therapy: Secondary | ICD-10-CM

## 2020-02-29 DIAGNOSIS — Z5112 Encounter for antineoplastic immunotherapy: Secondary | ICD-10-CM | POA: Diagnosis not present

## 2020-02-29 DIAGNOSIS — D693 Immune thrombocytopenic purpura: Secondary | ICD-10-CM | POA: Diagnosis not present

## 2020-02-29 DIAGNOSIS — D61818 Other pancytopenia: Secondary | ICD-10-CM | POA: Diagnosis not present

## 2020-02-29 DIAGNOSIS — F840 Autistic disorder: Secondary | ICD-10-CM | POA: Diagnosis not present

## 2020-02-29 DIAGNOSIS — F419 Anxiety disorder, unspecified: Secondary | ICD-10-CM | POA: Diagnosis not present

## 2020-02-29 DIAGNOSIS — D704 Cyclic neutropenia: Secondary | ICD-10-CM | POA: Diagnosis not present

## 2020-02-29 DIAGNOSIS — Z79620 Long term (current) use of immunosuppressive biologic: Secondary | ICD-10-CM

## 2020-02-29 LAB — COMPREHENSIVE METABOLIC PANEL
ALT: 18 U/L (ref 0–44)
AST: 16 U/L (ref 15–41)
Albumin: 4.1 g/dL (ref 3.5–5.0)
Alkaline Phosphatase: 42 U/L (ref 38–126)
Anion gap: 10 (ref 5–15)
BUN: 25 mg/dL — ABNORMAL HIGH (ref 6–20)
CO2: 31 mmol/L (ref 22–32)
Calcium: 9.3 mg/dL (ref 8.9–10.3)
Chloride: 98 mmol/L (ref 98–111)
Creatinine, Ser: 0.65 mg/dL (ref 0.61–1.24)
GFR, Estimated: >60 mL/min (ref >60)
Glucose, Bld: 88 mg/dL (ref 70–99)
Potassium: 3.7 mmol/L (ref 3.5–5.1)
Sodium: 139 mmol/L (ref 135–145)
Total Bilirubin: 0.9 mg/dL (ref 0.3–1.2)
Total Protein: 6.9 g/dL (ref 6.5–8.1)

## 2020-02-29 LAB — CBC WITH DIFFERENTIAL/PLATELET
Abs Immature Granulocytes: 0.01 10*3/uL (ref 0.00–0.07)
Basophils Absolute: 0 10*3/uL (ref 0.0–0.1)
Basophils Relative: 0 %
Eosinophils Absolute: 0 10*3/uL (ref 0.0–0.5)
Eosinophils Relative: 1 %
HCT: 43.5 % (ref 39.0–52.0)
Hemoglobin: 14.7 g/dL (ref 13.0–17.0)
Immature Granulocytes: 0 %
Lymphocytes Relative: 27 %
Lymphs Abs: 1.6 10*3/uL (ref 0.7–4.0)
MCH: 29.4 pg (ref 26.0–34.0)
MCHC: 33.8 g/dL (ref 30.0–36.0)
MCV: 87 fL (ref 80.0–100.0)
Monocytes Absolute: 0.7 10*3/uL (ref 0.1–1.0)
Monocytes Relative: 13 %
Neutro Abs: 3.4 10*3/uL (ref 1.7–7.7)
Neutrophils Relative %: 59 %
Platelets: 70 10*3/uL — ABNORMAL LOW (ref 150–400)
RBC: 5 MIL/uL (ref 4.22–5.81)
RDW: 13.1 % (ref 11.5–15.5)
WBC: 5.8 10*3/uL (ref 4.0–10.5)
nRBC: 0 % (ref 0.0–0.2)

## 2020-02-29 MED ORDER — SODIUM CHLORIDE 0.9 % IV SOLN
Freq: Once | INTRAVENOUS | Status: AC
  Filled 2020-02-29: qty 250

## 2020-02-29 MED ORDER — SODIUM CHLORIDE 0.9 % IV SOLN: 375.0000 mg/m2 | Freq: Once | INTRAVENOUS | Status: DC

## 2020-02-29 MED ORDER — PREDNISONE 50 MG PO TABS: ORAL_TABLET | ORAL | 0 refills | Status: DC

## 2020-02-29 MED ORDER — PANTOPRAZOLE SODIUM 20 MG PO TBEC: 20.0000 mg | DELAYED_RELEASE_TABLET | Freq: Every day | ORAL | 2 refills | Status: DC

## 2020-02-29 MED ORDER — DIPHENHYDRAMINE HCL 25 MG PO CAPS
50.0000 mg | ORAL_CAPSULE | Freq: Once | ORAL | Status: AC
  Administered 2020-02-29: 11:00:00 50 mg via ORAL
  Filled 2020-02-29: qty 2

## 2020-02-29 MED ORDER — SODIUM CHLORIDE 0.9 % IV SOLN
375.0000 mg/m2 | Freq: Once | INTRAVENOUS | Status: AC
  Administered 2020-02-29: 12:00:00 800 mg via INTRAVENOUS
  Filled 2020-02-29: qty 50

## 2020-02-29 MED ORDER — SULFAMETHOXAZOLE-TRIMETHOPRIM 800-160 MG PO TABS: 1.0000 | ORAL_TABLET | ORAL | 0 refills | Status: DC

## 2020-02-29 MED ORDER — ACETAMINOPHEN 325 MG PO TABS
650.0000 mg | ORAL_TABLET | Freq: Once | ORAL | Status: AC
  Administered 2020-02-29: 11:00:00 650 mg via ORAL
  Filled 2020-02-29: qty 2

## 2020-02-29 NOTE — Progress Notes (Signed)
Pt received rapid ritux infusion today. Tolerated well. Ambulatory at d/c.

## 2020-02-29 NOTE — Progress Notes (Signed)
No new changes noted today 

## 2020-02-29 NOTE — Progress Notes (Signed)
Hematology/Oncology Consult note [facility]  Telephone:([phone] [phone]  Patient Care Team: [name], MD as PCP - General (Internal Medicine) Sindy Guadeloupe, MD as Consulting Physician (Hematology and Oncology)   Name of the patient: Bobby Dean  [id number]  01/06/1991   Date of visit: 02/29/20  Diagnosis- autoimmune neutropenia and thrombocytopenia  Chief complaint/ Reason for visit-on treatment assessment prior to cycle 2 of weekly Rituxan  Heme/Onc history: Patient is a 29 year old male was then referred to [place] for evaluation and management of thrombocytopenia. He has a history of autism and intellectual visibility and currently lives in a group home for the last 7 months. His medical decision maker is [name] from [place] office.   Blood work from 04/28/2016 showed white count of 2.1 with an H&H of 15.5/45.1 and a platelet count of 51. CMP was within normal limits. TSH was normal at 1.09. At this time I do not have any prior CBCs for comparison.  Bone marrow biopsy on 05/14/2016 showed normal trilineage hematopoiesis with no evidence of lymphoma or leukemia. Peripheral flow cytometry showed reversal of CD4 to CD8 ratio. HIV hepatitis B and hepatitis C testing was negative. B12 and folate were within normal limits. PT/PTT INR was within normal limits.  Review of peripheral smear showed leukopenia and moderate neutropenia. Thrombocytopenia. RBC morphology is normal. Scattered reactive appearing lymphocytes are seen. No blasts or early forms noted.  Monospot testing was positive. ANA was negative. Quantitative immunoglobulins were within normal limits. Serum copper was within normal limits. CMV DNA PCR negative  Patient was started on empiric high dose steroids for possible autoimmune etiology on 05/28/16. His counts normalized. Then his steroid taper was started. His WBC remained normal but his platelets began to  decline 60's.   Plan was to restart full dose steroids and switch to rituxan second line after receiving pre splenectomy vaccines in anticipation of splenectomy should he not respond to rituxan  Patient has received all his pre splenectomy vaccines. He startedrituxan on 10/14/16. Seen at [facility] for second opinion by [name] who agrees with the plan.  [name] andfollowed byslow steroid taper. Steroids were stopped in September 2018  recurrent neutropenia/ TCP noted in sept 2019. rituxan and steroids restarted on 10/3/19and counts normalized.Patient has been requiring Rituxan roughly every 8 to 12 months  Interval history-patient reports feeling well and denies any complaints at this time.  He is tolerating his steroids without any side effects.  He is also taking Protonix And Bactrim.  ECOG PS- 0 Pain scale- 0   Review of systems- Review of Systems  Constitutional: Negative for chills, fever, malaise/fatigue and weight loss.  HENT: Negative for congestion, ear discharge and nosebleeds.   Eyes: Negative for blurred vision.  Respiratory: Negative for cough, hemoptysis, sputum production, shortness of breath and wheezing.   Cardiovascular: Negative for chest pain, palpitations, orthopnea and claudication.  Gastrointestinal: Negative for abdominal pain, blood in stool, constipation, diarrhea, heartburn, melena, nausea and vomiting.  Genitourinary: Negative for dysuria, flank pain, frequency, hematuria and urgency.  Musculoskeletal: Negative for back pain, joint pain and myalgias.  Skin: Negative for rash.  Neurological: Negative for dizziness, tingling, focal weakness, seizures, weakness and headaches.  Endo/Heme/Allergies: Does not bruise/bleed easily.  Psychiatric/Behavioral: Negative for depression and suicidal ideas. The patient does not have insomnia.       No Known Allergies   Past Medical History:  Diagnosis Date  .  Anxiety   .  Autism   . Autism   . 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 No pertinent surgical history.  Social History   Socioeconomic History  . Marital status: Single    Spouse name: Not on file  . Number of children: Not on file  . Years of education: Not on file  . Highest education level: Not on file  Occupational History  . Not on file  Tobacco Use  . Smoking status: Never Smoker  . Smokeless tobacco: Never Used  Vaping Use  . Vaping Use: Never used  Substance and Sexual Activity  . Alcohol use: No  . Drug use: No  . Sexual activity: Never  Other Topics Concern  . Not on file  Social History Narrative  . Not on file   Social Determinants of Health   Financial Resource Strain: Not on file  Food Insecurity: Not on file  Transportation Needs: Not on file  Physical Activity: Not on file  Stress: Not on file  Social Connections: Not on file  Intimate Partner Violence: Not on file    Family History  Problem Relation Age of Onset  . Cancer Mother      Current Outpatient Medications:  .  ascorbic acid (GNP VITAMIN C) 500 MG tablet, Take 1 tablet by mouth daily, Disp: 30 tablet, Rfl: 0 .  cholecalciferol (VITAMIN D) 25 MCG (1000 UNIT) tablet, TAKE 1 TABLET BY MOUTH ONCE DAILY., Disp: 30 tablet, Rfl: 0 .  pantoprazole (PROTONIX) 20 MG tablet, Take 1 tablet (20 mg total) by mouth daily., Disp: 30 tablet, Rfl: 2 .  predniSONE (DELTASONE) 50 MG tablet, 1 tablet daily with food, Disp: 21 tablet, Rfl: 0 .  sulfamethoxazole-trimethoprim (BACTRIM DS) 800-160 MG tablet, Take 1 tablet by mouth 3 (three) times a week., Disp: 15 tablet, Rfl: 0  Physical exam:  Vitals:   02/29/20 1011  BP: 127/86  Pulse: 67  Resp: 18  Temp: 98.6 F (37 C)  TempSrc: Tympanic  SpO2: 100%  Weight: 183 lb 14.4 oz (83.4 kg)  Height: _0  (1.905 m)   Physical Exam Constitutional:      General: He is not in acute distress. Eyes:     Extraocular Movements: EOM normal.   Cardiovascular:     Rate and Rhythm: Normal rate and regular rhythm.     Heart sounds: Normal heart sounds.  Pulmonary:     Effort: Pulmonary effort is normal.     Breath sounds: Normal breath sounds.  Abdominal:     General: Bowel sounds are normal.     Palpations: Abdomen is soft.  Skin:    General: Skin is warm and dry.  Neurological:     Mental Status: He is alert and oriented to person, place, and time.      CMP Latest Ref Rng & Units 02/29/2020  Glucose 70 - 99 mg/dL 88  BUN 6 - 20 mg/dL 25(H)  Creatinine 0.61 - 1.24 mg/dL 0.65  Sodium 135 - 145 mmol/L 139  Potassium 3.5 - 5.1 mmol/L 3.7  Chloride 98 - 111 mmol/L 98  CO2 22 - 32 mmol/L 31  Calcium 8.9 - 10.3 mg/dL 9.3  Total Protein 6.5 - 8.1 g/dL 6.9  Total Bilirubin 0.3 - 1.2 mg/dL 0.9  Alkaline Phos 38 - 126 U/L 42  AST 15 - 41 U/L 16  ALT 0 - 44 U/L 18   CBC Latest Ref Rng & Units 02/29/2020  WBC 4.0 -  10.5 K/uL 5.8  Hemoglobin 13.0 - 17.0 g/dL 14.7  Hematocrit 39.0 - 52.0 % 43.5  Platelets 150 - 400 K/uL 70(L)      Assessment and plan- Patient is a 29 y.o. male with autoimmune pancytopenia mainly thrombocytopenia and mild neutropenia here for on treatment assessment prior to cycle 2 of weekly Rituxan chemotherapy  Counts okay to proceed with cycle 2 of weekly Rituxan chemotherapy today.  He will also continue to take prednisone 50 mg once a day until he completes 4 weekly cycles of Rituxan following which I will start a steroid taper.  Platelet counts have already improved from 38-70 in the last 10 days.  He will directly proceed with cycle 3 of weekly Rituxan in 1 week and I will see him back in 2 weeks for last cycle of Rituxan   Visit Diagnosis 1. 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 Visit for monitoring Rituxan therapy      [name], MD, MPH [facility] at [facility] [phone] 02/29/2020 2:03 PM

## 2020-03-06 ENCOUNTER — Inpatient Hospital Stay: Payer: Medicare Other

## 2020-03-06 VITALS — BP 144/94 | HR 98 | Temp 98.1°F | Resp 16

## 2020-03-06 DIAGNOSIS — F840 Autistic disorder: Secondary | ICD-10-CM | POA: Diagnosis not present

## 2020-03-06 DIAGNOSIS — D61818 Other pancytopenia: Secondary | ICD-10-CM

## 2020-03-06 DIAGNOSIS — F419 Anxiety disorder, unspecified: Secondary | ICD-10-CM | POA: Diagnosis not present

## 2020-03-06 DIAGNOSIS — Z79899 Other long term (current) drug therapy: Secondary | ICD-10-CM | POA: Diagnosis not present

## 2020-03-06 DIAGNOSIS — Z5181 Encounter for therapeutic drug level monitoring: Secondary | ICD-10-CM

## 2020-03-06 DIAGNOSIS — Z5112 Encounter for antineoplastic immunotherapy: Secondary | ICD-10-CM | POA: Diagnosis not present

## 2020-03-06 DIAGNOSIS — D704 Cyclic neutropenia: Secondary | ICD-10-CM | POA: Diagnosis not present

## 2020-03-06 DIAGNOSIS — D693 Immune thrombocytopenic purpura: Secondary | ICD-10-CM | POA: Diagnosis not present

## 2020-03-06 LAB — CBC WITH DIFFERENTIAL/PLATELET
Abs Immature Granulocytes: 0.03 10*3/uL (ref 0.00–0.07)
Basophils Absolute: 0 10*3/uL (ref 0.0–0.1)
Basophils Relative: 0 %
Eosinophils Absolute: 0 10*3/uL (ref 0.0–0.5)
Eosinophils Relative: 0 %
HCT: 43.1 % (ref 39.0–52.0)
Hemoglobin: 14.9 g/dL (ref 13.0–17.0)
Immature Granulocytes: 1 %
Lymphocytes Relative: 25 %
Lymphs Abs: 1.3 10*3/uL (ref 0.7–4.0)
MCH: 30 pg (ref 26.0–34.0)
MCHC: 34.6 g/dL (ref 30.0–36.0)
MCV: 86.7 fL (ref 80.0–100.0)
Monocytes Absolute: 0.5 10*3/uL (ref 0.1–1.0)
Monocytes Relative: 9 %
Neutro Abs: 3.5 10*3/uL (ref 1.7–7.7)
Neutrophils Relative %: 65 %
Platelets: 73 10*3/uL — ABNORMAL LOW (ref 150–400)
RBC: 4.97 MIL/uL (ref 4.22–5.81)
RDW: 13 % (ref 11.5–15.5)
WBC: 5.3 10*3/uL (ref 4.0–10.5)
nRBC: 0 % (ref 0.0–0.2)

## 2020-03-06 LAB — COMPREHENSIVE METABOLIC PANEL
ALT: 18 U/L (ref 0–44)
AST: 18 U/L (ref 15–41)
Albumin: 4.3 g/dL (ref 3.5–5.0)
Alkaline Phosphatase: 42 U/L (ref 38–126)
Anion gap: 10 (ref 5–15)
BUN: 12 mg/dL (ref 6–20)
CO2: 30 mmol/L (ref 22–32)
Calcium: 9.3 mg/dL (ref 8.9–10.3)
Chloride: 98 mmol/L (ref 98–111)
Creatinine, Ser: 0.68 mg/dL (ref 0.61–1.24)
GFR, Estimated: >60 mL/min (ref >60)
Glucose, Bld: 55 mg/dL — ABNORMAL LOW (ref 70–99)
Potassium: 3.4 mmol/L — ABNORMAL LOW (ref 3.5–5.1)
Sodium: 138 mmol/L (ref 135–145)
Total Bilirubin: 1.2 mg/dL (ref 0.3–1.2)
Total Protein: 6.9 g/dL (ref 6.5–8.1)

## 2020-03-06 MED ORDER — SODIUM CHLORIDE 0.9 % IV SOLN
375.0000 mg/m2 | Freq: Once | INTRAVENOUS | Status: AC
  Administered 2020-03-06: 10:00:00 800 mg via INTRAVENOUS
  Filled 2020-03-06: qty 50

## 2020-03-06 MED ORDER — DIPHENHYDRAMINE HCL 25 MG PO CAPS
50.0000 mg | ORAL_CAPSULE | Freq: Once | ORAL | Status: AC
  Administered 2020-03-06: 09:00:00 50 mg via ORAL
  Filled 2020-03-06: qty 2

## 2020-03-06 MED ORDER — SODIUM CHLORIDE 0.9 % IV SOLN
Freq: Once | INTRAVENOUS | Status: AC
Start: 2020-03-06 — End: 2020-03-06
  Filled 2020-03-06: qty 250

## 2020-03-06 MED ORDER — SODIUM CHLORIDE 0.9 % IV SOLN: 375.0000 mg/m2 | Freq: Once | INTRAVENOUS | Status: DC

## 2020-03-06 MED ORDER — ACETAMINOPHEN 325 MG PO TABS
650.0000 mg | ORAL_TABLET | Freq: Once | ORAL | Status: AC
  Administered 2020-03-06: 09:00:00 650 mg via ORAL
  Filled 2020-03-06: qty 2

## 2020-03-06 NOTE — Progress Notes (Signed)
Patient tolerated infusion well. Discharged home.  

## 2020-03-17 ENCOUNTER — Inpatient Hospital Stay: Payer: Medicare Other

## 2020-03-17 ENCOUNTER — Inpatient Hospital Stay: Payer: Medicare Other | Admitting: Oncology

## 2020-03-20 ENCOUNTER — Other Ambulatory Visit: Payer: Self-pay

## 2020-03-20 MED ORDER — ASCORBIC ACID 500 MG PO TABS: 500.0000 mg | ORAL_TABLET | Freq: Every day | ORAL | 1 refills | Status: DC

## 2020-03-25 ENCOUNTER — Inpatient Hospital Stay (HOSPITAL_BASED_OUTPATIENT_CLINIC_OR_DEPARTMENT_OTHER): Payer: Medicare Other | Admitting: Oncology

## 2020-03-25 ENCOUNTER — Inpatient Hospital Stay: Payer: Medicare Other | Attending: Oncology

## 2020-03-25 ENCOUNTER — Other Ambulatory Visit: Payer: Self-pay | Admitting: *Deleted

## 2020-03-25 ENCOUNTER — Encounter: Payer: Self-pay | Admitting: Oncology

## 2020-03-25 ENCOUNTER — Inpatient Hospital Stay: Payer: Medicare Other

## 2020-03-25 VITALS — BP 140/86 | HR 92 | Resp 19

## 2020-03-25 VITALS — BP 118/77 | HR 69 | Temp 97.4°F | Ht 75.0 in | Wt 180.7 lb

## 2020-03-25 DIAGNOSIS — Z5112 Encounter for antineoplastic immunotherapy: Secondary | ICD-10-CM | POA: Insufficient documentation

## 2020-03-25 DIAGNOSIS — F79 Unspecified intellectual disabilities: Secondary | ICD-10-CM | POA: Insufficient documentation

## 2020-03-25 DIAGNOSIS — D61818 Other pancytopenia: Secondary | ICD-10-CM | POA: Diagnosis not present

## 2020-03-25 DIAGNOSIS — Z5181 Encounter for therapeutic drug level monitoring: Secondary | ICD-10-CM | POA: Diagnosis not present

## 2020-03-25 DIAGNOSIS — D693 Immune thrombocytopenic purpura: Secondary | ICD-10-CM | POA: Insufficient documentation

## 2020-03-25 DIAGNOSIS — D708 Other neutropenia: Secondary | ICD-10-CM | POA: Diagnosis not present

## 2020-03-25 DIAGNOSIS — Z79899 Other long term (current) drug therapy: Secondary | ICD-10-CM | POA: Diagnosis not present

## 2020-03-25 DIAGNOSIS — F840 Autistic disorder: Secondary | ICD-10-CM | POA: Insufficient documentation

## 2020-03-25 DIAGNOSIS — Z79620 Long term (current) use of immunosuppressive biologic: Secondary | ICD-10-CM

## 2020-03-25 LAB — CBC WITH DIFFERENTIAL/PLATELET
Abs Immature Granulocytes: 0.01 10*3/uL (ref 0.00–0.07)
Basophils Absolute: 0 10*3/uL (ref 0.0–0.1)
Basophils Relative: 0 %
Eosinophils Absolute: 0 10*3/uL (ref 0.0–0.5)
Eosinophils Relative: 1 %
HCT: 44.9 % (ref 39.0–52.0)
Hemoglobin: 15.2 g/dL (ref 13.0–17.0)
Immature Granulocytes: 0 %
Lymphocytes Relative: 34 %
Lymphs Abs: 1.9 10*3/uL (ref 0.7–4.0)
MCH: 29.3 pg (ref 26.0–34.0)
MCHC: 33.9 g/dL (ref 30.0–36.0)
MCV: 86.5 fL (ref 80.0–100.0)
Monocytes Absolute: 0.7 10*3/uL (ref 0.1–1.0)
Monocytes Relative: 12 %
Neutro Abs: 2.9 10*3/uL (ref 1.7–7.7)
Neutrophils Relative %: 53 %
Platelets: 187 10*3/uL (ref 150–400)
RBC: 5.19 MIL/uL (ref 4.22–5.81)
RDW: 12.8 % (ref 11.5–15.5)
WBC: 5.4 10*3/uL (ref 4.0–10.5)
nRBC: 0 % (ref 0.0–0.2)

## 2020-03-25 LAB — COMPREHENSIVE METABOLIC PANEL
ALT: 15 U/L (ref 0–44)
AST: 16 U/L (ref 15–41)
Albumin: 4.1 g/dL (ref 3.5–5.0)
Alkaline Phosphatase: 41 U/L (ref 38–126)
Anion gap: 8 (ref 5–15)
BUN: 17 mg/dL (ref 6–20)
CO2: 30 mmol/L (ref 22–32)
Calcium: 9.2 mg/dL (ref 8.9–10.3)
Chloride: 98 mmol/L (ref 98–111)
Creatinine, Ser: 0.72 mg/dL (ref 0.61–1.24)
GFR, Estimated: >60 mL/min (ref >60)
Glucose, Bld: 66 mg/dL — ABNORMAL LOW (ref 70–99)
Potassium: 3.3 mmol/L — ABNORMAL LOW (ref 3.5–5.1)
Sodium: 136 mmol/L (ref 135–145)
Total Bilirubin: 0.6 mg/dL (ref 0.3–1.2)
Total Protein: 6.9 g/dL (ref 6.5–8.1)

## 2020-03-25 MED ORDER — SODIUM CHLORIDE 0.9 % IV SOLN
375.0000 mg/m2 | Freq: Once | INTRAVENOUS | Status: AC
  Administered 2020-03-25: 800 mg via INTRAVENOUS
  Filled 2020-03-25: qty 50

## 2020-03-25 MED ORDER — SODIUM CHLORIDE 0.9 % IV SOLN
Freq: Once | INTRAVENOUS | Status: AC
  Filled 2020-03-25: qty 250

## 2020-03-25 MED ORDER — PREDNISONE 10 MG PO TABS: 10.0000 mg | ORAL_TABLET | Freq: Every day | ORAL | 0 refills | Status: DC

## 2020-03-25 MED ORDER — ACETAMINOPHEN 325 MG PO TABS
650.0000 mg | ORAL_TABLET | Freq: Once | ORAL | Status: AC
  Administered 2020-03-25: 650 mg via ORAL
  Filled 2020-03-25: qty 2

## 2020-03-25 MED ORDER — DIPHENHYDRAMINE HCL 25 MG PO CAPS
50.0000 mg | ORAL_CAPSULE | Freq: Once | ORAL | Status: AC
  Administered 2020-03-25: 50 mg via ORAL
  Filled 2020-03-25: qty 2

## 2020-03-25 NOTE — Progress Notes (Signed)
Pt tolerated infusion well with no complications. VSS. Pt stable for discharge.   Kary Sugrue  

## 2020-03-25 NOTE — Progress Notes (Signed)
Hematology/Oncology Consult note [facility]  Telephone:([phone] Fax:([phone]  Patient Care Team: [name], MD as PCP - General (Internal Medicine) Sindy Guadeloupe, MD as Consulting Physician (Hematology and Oncology)   Name of the patient: Bobby Dean  [id number]  05-11-1990   Date of visit: 03/25/20  Diagnosis- autoimmune neutropenia and thrombocytopenia   Chief complaint/ Reason for visit-on treatment assessment prior to cycle 4 of weekly Rituxan  Heme/Onc history: Patient is a 30 year old male was then referred to [place] for evaluation and management of thrombocytopenia. He has a history of autism and intellectual visibility and currently lives in a group home for the last 7 months. His medical decision maker is [name] from [place] office.   Blood work from 04/28/2016 showed white count of 2.1 with an H&H of 15.5/45.1 and a platelet count of 51. CMP was within normal limits. TSH was normal at 1.09. At this time I do not have any prior CBCs for comparison.  Bone marrow biopsy on 05/14/2016 showed normal trilineage hematopoiesis with no evidence of lymphoma or leukemia. Peripheral flow cytometry showed reversal of CD4 to CD8 ratio. HIV hepatitis B and hepatitis C testing was negative. B12 and folate were within normal limits. PT/PTT INR was within normal limits.  Review of peripheral smear showed leukopenia and moderate neutropenia. Thrombocytopenia. RBC morphology is normal. Scattered reactive appearing lymphocytes are seen. No blasts or early forms noted.  Monospot testing was positive. ANA was negative. Quantitative immunoglobulins were within normal limits. Serum copper was within normal limits. CMV DNA PCR negative  Patient was started on empiric high dose steroids for possible autoimmune etiology on 05/28/16. His counts normalized. Then his steroid taper was started. His WBC remained normal but his platelets began  to decline 60's.   Plan was to restart full dose steroids and switch to rituxan second line after receiving pre splenectomy vaccines in anticipation of splenectomy should he not respond to rituxan  Patient has received all his pre splenectomy vaccines. He startedrituxan on 10/14/16. Seen at [facility] for second opinion by [name] who agrees with the plan.  [name] andfollowed byslow steroid taper. Steroids were stopped in September 2018  recurrent neutropenia/ TCP noted in sept 2019. rituxan and steroids restarted on 10/3/19and counts normalized.Patient has been requiring Rituxan roughly every 8 to 12 months   Interval history-patient is doing well overall and denies any complaints at this time.  ECOG PS- 0 Pain scale- 0   Review of systems- Review of Systems  Constitutional: Negative for chills, fever, malaise/fatigue and weight loss.  HENT: Negative for congestion, ear discharge and nosebleeds.   Eyes: Negative for blurred vision.  Respiratory: Negative for cough, hemoptysis, sputum production, shortness of breath and wheezing.   Cardiovascular: Negative for chest pain, palpitations, orthopnea and claudication.  Gastrointestinal: Negative for abdominal pain, blood in stool, constipation, diarrhea, heartburn, melena, nausea and vomiting.  Genitourinary: Negative for dysuria, flank pain, frequency, hematuria and urgency.  Musculoskeletal: Negative for back pain, joint pain and myalgias.  Skin: Negative for rash.  Neurological: Negative for dizziness, tingling, focal weakness, seizures, weakness and headaches.  Endo/Heme/Allergies: Does not bruise/bleed easily.  Psychiatric/Behavioral: Negative for depression and suicidal ideas. The patient does not have insomnia.       No Known Allergies   Past Medical History:  Diagnosis Date  . Anxiety   . Autism   . Autism   . 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  Social History   Socioeconomic History  . Marital status: Single    Spouse name: Not on file  . Number of children: Not on file  . Years of education: Not on file  . Highest education level: Not on file  Occupational History  . Not on file  Tobacco Use  . Smoking status: Never Smoker  . Smokeless tobacco: Never Used  Vaping Use  . Vaping Use: Never used  Substance and Sexual Activity  . Alcohol use: No  . Drug use: No  . Sexual activity: Never  Other Topics Concern  . Not on file  Social History Narrative  . Not on file   Social Determinants of Health   Financial Resource Strain: Not on file  Food Insecurity: Not on file  Transportation Needs: Not on file  Physical Activity: Not on file  Stress: Not on file  Social Connections: Not on file  Intimate Partner Violence: Not on file    Family History  Problem Relation Age of Onset  . Cancer Mother      Current Outpatient Medications:  .  ascorbic acid (GNP VITAMIN C) 500 MG tablet, Take 1 tablet (500 mg total) by mouth daily., Disp: 90 tablet, Rfl: 1 .  cholecalciferol (VITAMIN D) 25 MCG (1000 UNIT) tablet, TAKE 1 TABLET BY MOUTH ONCE DAILY., Disp: 30 tablet, Rfl: 0 .  pantoprazole (PROTONIX) 20 MG tablet, Take 1 tablet (20 mg total) by mouth daily., Disp: 30 tablet, Rfl: 2 .  predniSONE (DELTASONE) 50 MG tablet, 1 tablet daily with food, Disp: 21 tablet, Rfl: 0 .  sulfamethoxazole-trimethoprim (BACTRIM DS) 800-160 MG tablet, Take 1 tablet by mouth 3 (three) times a week., Disp: 15 tablet, Rfl: 0  Physical exam:  Vitals:   03/25/20 0841  BP: 118/77  Pulse: 69  Temp: (!) 97.4 F (36.3 C)  TempSrc: Oral  Weight: 180 lb 11.2 oz (82 kg)  Height: 6' 3"  (1.905 m)   Physical Exam HENT:     Mouth/Throat:     Mouth: Mucous membranes are moist.     Pharynx: Oropharynx is clear.  Eyes:     Extraocular Movements: EOM normal.  Cardiovascular:     Rate and Rhythm: Normal rate and regular rhythm.     Heart  sounds: Normal heart sounds.  Pulmonary:     Effort: Pulmonary effort is normal.     Breath sounds: Normal breath sounds.  Skin:    General: Skin is warm and dry.  Neurological:     Mental Status: He is alert and oriented to person, place, and time.      CMP Latest Ref Rng & Units 03/06/2020  Glucose 70 - 99 mg/dL 55(L)  BUN 6 - 20 mg/dL 12  Creatinine 0.61 - 1.24 mg/dL 0.68  Sodium 135 - 145 mmol/L 138  Potassium 3.5 - 5.1 mmol/L 3.4(L)  Chloride 98 - 111 mmol/L 98  CO2 22 - 32 mmol/L 30  Calcium 8.9 - 10.3 mg/dL 9.3  Total Protein 6.5 - 8.1 g/dL 6.9  Total Bilirubin 0.3 - 1.2 mg/dL 1.2  Alkaline Phos 38 - 126 U/L 42  AST 15 - 41 U/L 18  ALT 0 - 44 U/L 18   CBC Latest Ref Rng & Units 03/25/2020  WBC 4.0 - 10.5 K/uL 5.4  Hemoglobin 13.0 - 17.0 g/dL 15.2  Hematocrit 39.0 - 52.0 % 44.9  Platelets 150 - 400 K/uL 187     Assessment and  plan- Patient is a 30 y.o. 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  He will therefore be receiving his cycle 4 today.  Neutropenia and thrombocytopenia have resolved.  He will now start his steroid taper.  Patient is currently on 50 mg of prednisone which she will taper by 10 mg every 4 days and completely come off steroids in 1 month.  He will stop his Protonix once he is off steroids.  He will continue Bactrim prophylaxis for 2 to 3 months after stopping steroids.  CBC with differential in 2 weeks in 1 month and I will see him back in 1 month   Visit Diagnosis 1. 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 Encounter for monitoring rituximab therapy      [name], MD, MPH [facility] [phone] 03/25/2020 10:34 AM

## 2020-04-03 ENCOUNTER — Other Ambulatory Visit: Payer: Self-pay | Admitting: Internal Medicine

## 2020-04-07 ENCOUNTER — Other Ambulatory Visit: Payer: Self-pay | Admitting: *Deleted

## 2020-04-07 DIAGNOSIS — D61818 Other pancytopenia: Secondary | ICD-10-CM

## 2020-04-08 ENCOUNTER — Inpatient Hospital Stay: Payer: Medicare Other

## 2020-04-08 DIAGNOSIS — D61818 Other pancytopenia: Secondary | ICD-10-CM | POA: Diagnosis not present

## 2020-04-08 DIAGNOSIS — D693 Immune thrombocytopenic purpura: Secondary | ICD-10-CM | POA: Diagnosis not present

## 2020-04-08 DIAGNOSIS — F79 Unspecified intellectual disabilities: Secondary | ICD-10-CM | POA: Diagnosis not present

## 2020-04-08 DIAGNOSIS — F840 Autistic disorder: Secondary | ICD-10-CM | POA: Diagnosis not present

## 2020-04-08 DIAGNOSIS — D708 Other neutropenia: Secondary | ICD-10-CM | POA: Diagnosis not present

## 2020-04-08 DIAGNOSIS — Z5181 Encounter for therapeutic drug level monitoring: Secondary | ICD-10-CM

## 2020-04-08 DIAGNOSIS — Z5112 Encounter for antineoplastic immunotherapy: Secondary | ICD-10-CM | POA: Diagnosis not present

## 2020-04-08 LAB — CBC WITH DIFFERENTIAL/PLATELET
Abs Immature Granulocytes: 0 10*3/uL (ref 0.00–0.07)
Basophils Absolute: 0 10*3/uL (ref 0.0–0.1)
Basophils Relative: 0 %
Eosinophils Absolute: 0 10*3/uL (ref 0.0–0.5)
Eosinophils Relative: 1 %
HCT: 41.5 % (ref 39.0–52.0)
Hemoglobin: 14.2 g/dL (ref 13.0–17.0)
Immature Granulocytes: 0 %
Lymphocytes Relative: 20 %
Lymphs Abs: 0.8 10*3/uL (ref 0.7–4.0)
MCH: 29.3 pg (ref 26.0–34.0)
MCHC: 34.2 g/dL (ref 30.0–36.0)
MCV: 85.6 fL (ref 80.0–100.0)
Monocytes Absolute: 0.4 10*3/uL (ref 0.1–1.0)
Monocytes Relative: 9 %
Neutro Abs: 2.8 10*3/uL (ref 1.7–7.7)
Neutrophils Relative %: 70 %
Platelets: 108 10*3/uL — ABNORMAL LOW (ref 150–400)
RBC: 4.85 MIL/uL (ref 4.22–5.81)
RDW: 13.1 % (ref 11.5–15.5)
WBC: 4 10*3/uL (ref 4.0–10.5)
nRBC: 0 % (ref 0.0–0.2)

## 2020-04-08 LAB — COMPREHENSIVE METABOLIC PANEL
ALT: 13 U/L (ref 0–44)
AST: 15 U/L (ref 15–41)
Albumin: 4.1 g/dL (ref 3.5–5.0)
Alkaline Phosphatase: 39 U/L (ref 38–126)
Anion gap: 6 (ref 5–15)
BUN: 13 mg/dL (ref 6–20)
CO2: 32 mmol/L (ref 22–32)
Calcium: 9.2 mg/dL (ref 8.9–10.3)
Chloride: 100 mmol/L (ref 98–111)
Creatinine, Ser: 0.65 mg/dL (ref 0.61–1.24)
GFR, Estimated: >60 mL/min (ref >60)
Glucose, Bld: 86 mg/dL (ref 70–99)
Potassium: 3.8 mmol/L (ref 3.5–5.1)
Sodium: 138 mmol/L (ref 135–145)
Total Bilirubin: 0.6 mg/dL (ref 0.3–1.2)
Total Protein: 6.6 g/dL (ref 6.5–8.1)

## 2020-04-22 ENCOUNTER — Encounter: Payer: Self-pay | Admitting: Oncology

## 2020-04-22 ENCOUNTER — Inpatient Hospital Stay (HOSPITAL_BASED_OUTPATIENT_CLINIC_OR_DEPARTMENT_OTHER): Payer: Medicare Other | Admitting: Oncology

## 2020-04-22 ENCOUNTER — Inpatient Hospital Stay: Payer: Medicare Other | Attending: Oncology

## 2020-04-22 VITALS — BP 119/85 | HR 63 | Temp 98.6°F | Resp 20 | Wt 181.1 lb

## 2020-04-22 DIAGNOSIS — D61818 Other pancytopenia: Secondary | ICD-10-CM

## 2020-04-22 DIAGNOSIS — F419 Anxiety disorder, unspecified: Secondary | ICD-10-CM | POA: Diagnosis not present

## 2020-04-22 DIAGNOSIS — F840 Autistic disorder: Secondary | ICD-10-CM | POA: Diagnosis not present

## 2020-04-22 DIAGNOSIS — Z5181 Encounter for therapeutic drug level monitoring: Secondary | ICD-10-CM

## 2020-04-22 LAB — CBC WITH DIFFERENTIAL/PLATELET
Abs Immature Granulocytes: 0.01 10*3/uL (ref 0.00–0.07)
Basophils Absolute: 0 10*3/uL (ref 0.0–0.1)
Basophils Relative: 1 %
Eosinophils Absolute: 0.1 10*3/uL (ref 0.0–0.5)
Eosinophils Relative: 1 %
HCT: 43.8 % (ref 39.0–52.0)
Hemoglobin: 14.8 g/dL (ref 13.0–17.0)
Immature Granulocytes: 0 %
Lymphocytes Relative: 26 %
Lymphs Abs: 1.1 10*3/uL (ref 0.7–4.0)
MCH: 29.1 pg (ref 26.0–34.0)
MCHC: 33.8 g/dL (ref 30.0–36.0)
MCV: 86.2 fL (ref 80.0–100.0)
Monocytes Absolute: 0.4 10*3/uL (ref 0.1–1.0)
Monocytes Relative: 10 %
Neutro Abs: 2.6 10*3/uL (ref 1.7–7.7)
Neutrophils Relative %: 62 %
Platelets: 143 10*3/uL — ABNORMAL LOW (ref 150–400)
RBC: 5.08 MIL/uL (ref 4.22–5.81)
RDW: 13.2 % (ref 11.5–15.5)
WBC: 4.2 10*3/uL (ref 4.0–10.5)
nRBC: 0 % (ref 0.0–0.2)

## 2020-04-27 NOTE — Progress Notes (Signed)
Hematology/Oncology Consult note [facility]  Telephone:([phone] Fax:([phone]  Patient Care Team: [name], MD as PCP - General (Internal Medicine) Sindy Guadeloupe, MD as Consulting Physician (Hematology and Oncology)   Name of the patient: Bobby Dean  [id number]  10/23/1990   Date of visit: 04/27/20  Diagnosis- autoimmune neutropenia and thrombocytopenia  Chief complaint/ Reason for visit-routine follow-up of autoimmune pancytopenia  Heme/Onc history: Patient is a 30 year old male was then referred to [place] for evaluation and management of thrombocytopenia. He has a history of autism and intellectual visibility and currently lives in a group home for the last 7 months. His medical decision maker is [name] from [place] office.   Blood work from 04/28/2016 showed white count of 2.1 with an H&H of 15.5/45.1 and a platelet count of 51. CMP was within normal limits. TSH was normal at 1.09. At this time I do not have any prior CBCs for comparison.  Bone marrow biopsy on 05/14/2016 showed normal trilineage hematopoiesis with no evidence of lymphoma or leukemia. Peripheral flow cytometry showed reversal of CD4 to CD8 ratio. HIV hepatitis B and hepatitis C testing was negative. B12 and folate were within normal limits. PT/PTT INR was within normal limits.  Review of peripheral smear showed leukopenia and moderate neutropenia. Thrombocytopenia. RBC morphology is normal. Scattered reactive appearing lymphocytes are seen. No blasts or early forms noted.  Monospot testing was positive. ANA was negative. Quantitative immunoglobulins were within normal limits. Serum copper was within normal limits. CMV DNA PCR negative  Patient was started on empiric high dose steroids for possible autoimmune etiology on 05/28/16. His counts normalized. Then his steroid taper was started. His WBC remained normal but his platelets began to decline 60's.    Plan was to restart full dose steroids and switch to rituxan second line after receiving pre splenectomy vaccines in anticipation of splenectomy should he not respond to rituxan  Patient has received all his pre splenectomy vaccines. He startedrituxan on 10/14/16. Seen at [facility] for second opinion by [name] who agrees with the plan.  [name] andfollowed byslow steroid taper. Steroids were stopped in September 2018  recurrent neutropenia/ TCP noted in sept 2019. rituxan and steroids restarted on 10/3/19and counts normalized.Patient has been requiring Rituxan roughly every 8 to 12 months   Interval history-patient reports doing well and denies any complaints at this time  ECOG PS- 0 Pain scale- 0   Review of systems- Review of Systems  Constitutional: Negative for chills, fever, malaise/fatigue and weight loss.  HENT: Negative for congestion, ear discharge and nosebleeds.   Eyes: Negative for blurred vision.  Respiratory: Negative for cough, hemoptysis, sputum production, shortness of breath and wheezing.   Cardiovascular: Negative for chest pain, palpitations, orthopnea and claudication.  Gastrointestinal: Negative for abdominal pain, blood in stool, constipation, diarrhea, heartburn, melena, nausea and vomiting.  Genitourinary: Negative for dysuria, flank pain, frequency, hematuria and urgency.  Musculoskeletal: Negative for back pain, joint pain and myalgias.  Skin: Negative for rash.  Neurological: Negative for dizziness, tingling, focal weakness, seizures, weakness and headaches.  Endo/Heme/Allergies: Does not bruise/bleed easily.  Psychiatric/Behavioral: Negative for depression and suicidal ideas. The patient does not have insomnia.       No Known Allergies   Past Medical History:  Diagnosis Date  . Anxiety   . Autism   . Autism   . Autoimmune pancytopenia (H[facility])      No past  surgical history on file.  Social  History   Socioeconomic History  . Marital status: Single    Spouse name: Not on file  . Number of children: Not on file  . Years of education: Not on file  . Highest education level: Not on file  Occupational History  . Not on file  Tobacco Use  . Smoking status: Never Smoker  . Smokeless tobacco: Never Used  Vaping Use  . Vaping Use: Never used  Substance and Sexual Activity  . Alcohol use: No  . Drug use: No  . Sexual activity: Not Currently  Other Topics Concern  . Not on file  Social History Narrative  . Not on file   Social Determinants of Health   Financial Resource Strain: Not on file  Food Insecurity: Not on file  Transportation Needs: Not on file  Physical Activity: Not on file  Stress: Not on file  Social Connections: Not on file  Intimate Partner Violence: Not on file    Family History  Problem Relation Age of Onset  . Cancer Mother      Current Outpatient Medications:  .  ascorbic acid (GNP VITAMIN C) 500 MG tablet, Take 1 tablet (500 mg total) by mouth daily., Disp: 90 tablet, Rfl: 1 .  cholecalciferol (VITAMIN D) 25 MCG (1000 UNIT) tablet, TAKE 1 TABLET BY MOUTH ONCE DAILY., Disp: 30 tablet, Rfl: 0 .  pantoprazole (PROTONIX) 20 MG tablet, Take 1 tablet (20 mg total) by mouth daily., Disp: 30 tablet, Rfl: 2  Physical exam:  Vitals:   04/22/20 0951  BP: 119/85  Pulse: 63  Resp: 20  Temp: 98.6 F (37 C)  TempSrc: Tympanic  SpO2: 100%  Weight: 181 lb 1.6 oz (82.1 kg)   Physical Exam Eyes:     Extraocular Movements: EOM normal.     Pupils: Pupils are equal, round, and reactive to light.  Cardiovascular:     Rate and Rhythm: Normal rate and regular rhythm.     Heart sounds: Normal heart sounds.  Pulmonary:     Effort: Pulmonary effort is normal.     Breath sounds: Normal breath sounds.  Skin:    General: Skin is warm and dry.  Neurological:     Mental Status: He is alert and oriented to person, place, and time.      CMP Latest Ref  Rng & Units 04/08/2020  Glucose 70 - 99 mg/dL 86  BUN 6 - 20 mg/dL 13  Creatinine 1.32 - 7.77 mg/dL 2.47  Sodium 214 - 753 mmol/L 138  Potassium 3.5 - 5.1 mmol/L 3.8  Chloride 98 - 111 mmol/L 100  CO2 22 - 32 mmol/L 32  Calcium 8.9 - 10.3 mg/dL 9.2  Total Protein 6.5 - 8.1 g/dL 6.6  Total Bilirubin 0.3 - 1.2 mg/dL 0.6  Alkaline Phos 38 - 126 U/L 39  AST 15 - 41 U/L 15  ALT 0 - 44 U/L 13   CBC Latest Ref Rng & Units 04/22/2020  WBC 4.0 - 10.5 K/uL 4.2  Hemoglobin 13.0 - 17.0 g/dL 38.9  Hematocrit 49.5 - 52.0 % 43.8  Platelets 150 - 400 K/uL 143(L)      Assessment and plan- Patient is a 30 y.o. male with autoimmune pancytopenia s/p 4 cycles of weekly Rituxan here for routine follow-up.   Patient has been requiring Rituxan about once a year when his thrombocytopenia becomes significant.  On 02/12/2020 his platelet counts dropped at 38 when we gave him Rituxan.  Presently  his neutropenia has resolved and platelet counts are at 143 today.  Repeat CBC with differential in 2 months in 4 months and I will see him back in 4 months.  Patient can stop taking his Bactrim prophylaxis at this time as well as pantoprazole   Visit Diagnosis 1. 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, MD, MPH [facility] [phone] 04/27/2020 5:18 PM

## 2020-05-02 ENCOUNTER — Other Ambulatory Visit: Payer: Self-pay | Admitting: Oncology

## 2020-05-05 ENCOUNTER — Encounter: Payer: Self-pay | Admitting: Internal Medicine

## 2020-05-05 ENCOUNTER — Other Ambulatory Visit: Payer: Self-pay

## 2020-05-05 ENCOUNTER — Ambulatory Visit (INDEPENDENT_AMBULATORY_CARE_PROVIDER_SITE_OTHER): Payer: Medicare Other | Admitting: Internal Medicine

## 2020-05-05 VITALS — BP 117/75 | HR 59 | Ht 75.0 in | Wt 183.7 lb

## 2020-05-05 DIAGNOSIS — F840 Autistic disorder: Secondary | ICD-10-CM | POA: Diagnosis not present

## 2020-05-05 DIAGNOSIS — F419 Anxiety disorder, unspecified: Secondary | ICD-10-CM | POA: Diagnosis not present

## 2020-05-05 DIAGNOSIS — D61818 Other pancytopenia: Secondary | ICD-10-CM

## 2020-05-05 MED ORDER — ASCORBIC ACID 500 MG PO TABS: 500.0000 mg | ORAL_TABLET | Freq: Every day | ORAL | 1 refills | Status: DC

## 2020-05-05 NOTE — Progress Notes (Signed)
Established Patient Office Visit  Subjective:  Patient ID: Bobby Dean, male    DOB: 17-May-1990  Age: 30 y.o. MRN: [id number]  CC:  Chief Complaint  Patient presents with  . Follow-up    Patient is here for his 3 month general check up    HPI  Bobby Dean presents forPt  Has  Low platelets count  Past Medical History:  Diagnosis Date  . Anxiety   . Autism   . Autism   . Autoimmune pancytopenia (HCC)     History reviewed. No pertinent surgical history.  Family History  Problem Relation Age of Onset  . Cancer Mother     Social History   Socioeconomic History  . Marital status: Single    Spouse name: Not on file  . Number of children: Not on file  . Years of education: Not on file  . Highest education level: Not on file  Occupational History  . Not on file  Tobacco Use  . Smoking status: Never Smoker  . Smokeless tobacco: Never Used  Vaping Use  . Vaping Use: Never used  Substance and Sexual Activity  . Alcohol use: No  . Drug use: No  . Sexual activity: Not Currently  Other Topics Concern  . Not on file  Social History Narrative  . Not on file   Social Determinants of Health   Financial Resource Strain: Not on file  Food Insecurity: Not on file  Transportation Needs: Not on file  Physical Activity: Not on file  Stress: Not on file  Social Connections: Not on file  Intimate Partner Violence: Not on file     Current Outpatient Medications:  .  ascorbic acid (GNP VITAMIN C) 500 MG tablet, Take 1 tablet (500 mg total) by mouth daily., Disp: 90 tablet, Rfl: 1 .  cholecalciferol (VITAMIN D) 25 MCG (1000 UNIT) tablet, TAKE 1 TABLET BY MOUTH ONCE DAILY., Disp: 30 tablet, Rfl: 0   No Known Allergies  ROS Review of Systems  Constitutional: Negative.   HENT: Negative.   Eyes: Negative.   Respiratory: Negative.   Cardiovascular: Negative.   Gastrointestinal: Negative.   Endocrine: Negative.   Genitourinary: Negative.   Musculoskeletal:  Negative.   Skin: Negative.   Allergic/Immunologic: Negative.   Neurological: Negative.   Hematological: Negative.   Psychiatric/Behavioral: Negative.   All other systems reviewed and are negative.     Objective:    Physical Exam Vitals reviewed.  Constitutional:      Appearance: Normal appearance.  HENT:     Mouth/Throat:     Mouth: Mucous membranes are moist.  Eyes:     Pupils: Pupils are equal, round, and reactive to light.  Neck:     Vascular: No carotid bruit.  Cardiovascular:     Rate and Rhythm: Normal rate and regular rhythm.     Pulses: Normal pulses.     Heart sounds: Normal heart sounds.  Pulmonary:     Effort: Pulmonary effort is normal.     Breath sounds: Normal breath sounds.  Abdominal:     General: Bowel sounds are normal.     Palpations: Abdomen is soft. There is no hepatomegaly, splenomegaly or mass.     Tenderness: There is no abdominal tenderness.     Hernia: No hernia is present.  Musculoskeletal:     Cervical back: Neck supple.     Right lower leg: No edema.     Left lower leg: No edema.  Skin:    Findings:  No rash.  Neurological:     Mental Status: He is alert and oriented to person, place, and time.     Motor: No weakness.  Psychiatric:        Mood and Affect: Mood normal.        Behavior: Behavior normal.     BP 117/75   Pulse (!) 59   Ht 6\' 3"  (1.905 m)   Wt 183 lb 11.2 oz (83.3 kg)   BMI 22.96 kg/m  Wt Readings from Last 3 Encounters:  05/05/20 183 lb 11.2 oz (83.3 kg)  04/22/20 181 lb 1.6 oz (82.1 kg)  03/25/20 180 lb 11.2 oz (82 kg)     Health Maintenance Due  Topic Date Due  . COVID-19 Vaccine (2 - Moderna risk 4-dose series) 02/19/2020    There are no preventive care reminders to display for this patient.  Lab Results  Component Value Date   TSH 0.809 01/19/2017   Lab Results  Component Value Date   WBC 4.2 04/22/2020   HGB 14.8 04/22/2020   HCT 43.8 04/22/2020   MCV 86.2 04/22/2020   PLT 143 (L) 04/22/2020    Lab Results  Component Value Date   NA 138 04/08/2020   K 3.8 04/08/2020   CO2 32 04/08/2020   GLUCOSE 86 04/08/2020   BUN 13 04/08/2020   CREATININE 0.65 04/08/2020   BILITOT 0.6 04/08/2020   ALKPHOS 39 04/08/2020   AST 15 04/08/2020   ALT 13 04/08/2020   PROT 6.6 04/08/2020   ALBUMIN 4.1 04/08/2020   CALCIUM 9.2 04/08/2020   ANIONGAP 6 04/08/2020   No results found for: CHOL No results found for: HDL No results found for: LDLCALC No results found for: TRIG No results found for: CHOLHDL No results found for: 04/10/2020    Assessment & Plan:   Problem List Items Addressed This Visit      Hematopoietic and Hemostatic   Autoimmune pancytopenia (HCC) - Primary    Platelet count 143,000 patient does not have any lymphadenopathy or splenomegaly.      Relevant Medications   ascorbic acid (GNP VITAMIN C) 500 MG tablet     Other   Autism    It is a chronic problem.      Anxiety    Patient is under the care of a psychiatrist and he manages that.         Meds ordered this encounter  Medications  . ascorbic acid (GNP VITAMIN C) 500 MG tablet    Sig: Take 1 tablet (500 mg total) by mouth daily.    Dispense:  90 tablet    Refill:  1    Follow-up: No follow-ups on file.    [id number], MD

## 2020-05-09 ENCOUNTER — Encounter: Payer: Self-pay | Admitting: Internal Medicine

## 2020-05-09 NOTE — Assessment & Plan Note (Signed)
Platelet count 143,000 patient does not have any lymphadenopathy or splenomegaly.

## 2020-05-09 NOTE — Assessment & Plan Note (Signed)
Patient is under the care of a psychiatrist and he manages that.

## 2020-05-09 NOTE — Assessment & Plan Note (Signed)
It is a chronic problem 

## 2020-05-27 ENCOUNTER — Other Ambulatory Visit: Payer: Self-pay | Admitting: Internal Medicine

## 2020-06-23 ENCOUNTER — Inpatient Hospital Stay: Payer: Medicare Other | Attending: Oncology

## 2020-06-23 ENCOUNTER — Other Ambulatory Visit: Payer: Self-pay

## 2020-06-23 DIAGNOSIS — Z5181 Encounter for therapeutic drug level monitoring: Secondary | ICD-10-CM

## 2020-06-23 DIAGNOSIS — D61818 Other pancytopenia: Secondary | ICD-10-CM | POA: Diagnosis not present

## 2020-06-23 DIAGNOSIS — Z79899 Other long term (current) drug therapy: Secondary | ICD-10-CM

## 2020-06-23 LAB — CBC WITH DIFFERENTIAL/PLATELET
Abs Immature Granulocytes: 0.01 10*3/uL (ref 0.00–0.07)
Basophils Absolute: 0 10*3/uL (ref 0.0–0.1)
Basophils Relative: 1 %
Eosinophils Absolute: 0.1 10*3/uL (ref 0.0–0.5)
Eosinophils Relative: 1 %
HCT: 43.9 % (ref 39.0–52.0)
Hemoglobin: 15 g/dL (ref 13.0–17.0)
Immature Granulocytes: 0 %
Lymphocytes Relative: 40 %
Lymphs Abs: 1.4 10*3/uL (ref 0.7–4.0)
MCH: 29.4 pg (ref 26.0–34.0)
MCHC: 34.2 g/dL (ref 30.0–36.0)
MCV: 86.1 fL (ref 80.0–100.0)
Monocytes Absolute: 0.4 10*3/uL (ref 0.1–1.0)
Monocytes Relative: 12 %
Neutro Abs: 1.7 10*3/uL (ref 1.7–7.7)
Neutrophils Relative %: 46 %
Platelets: 143 10*3/uL — ABNORMAL LOW (ref 150–400)
RBC: 5.1 MIL/uL (ref 4.22–5.81)
RDW: 12.6 % (ref 11.5–15.5)
WBC: 3.5 10*3/uL — ABNORMAL LOW (ref 4.0–10.5)
nRBC: 0 % (ref 0.0–0.2)

## 2020-07-24 ENCOUNTER — Other Ambulatory Visit: Payer: Self-pay | Admitting: Internal Medicine

## 2020-08-04 ENCOUNTER — Encounter: Payer: Self-pay | Admitting: Internal Medicine

## 2020-08-04 ENCOUNTER — Ambulatory Visit (INDEPENDENT_AMBULATORY_CARE_PROVIDER_SITE_OTHER): Payer: Medicare Other | Admitting: Internal Medicine

## 2020-08-04 ENCOUNTER — Other Ambulatory Visit: Payer: Self-pay

## 2020-08-04 VITALS — BP 111/79 | HR 54 | Ht 75.0 in | Wt 172.5 lb

## 2020-08-04 DIAGNOSIS — D61818 Other pancytopenia: Secondary | ICD-10-CM | POA: Diagnosis not present

## 2020-08-04 DIAGNOSIS — F419 Anxiety disorder, unspecified: Secondary | ICD-10-CM

## 2020-08-04 DIAGNOSIS — F840 Autistic disorder: Secondary | ICD-10-CM | POA: Diagnosis not present

## 2020-08-12 ENCOUNTER — Encounter: Payer: Self-pay | Admitting: Internal Medicine

## 2020-08-12 NOTE — Assessment & Plan Note (Signed)
Chronic stable problem ?

## 2020-08-12 NOTE — Progress Notes (Signed)
Established Patient Office Visit  Subjective:  Patient ID: Bobby Dean, male    DOB: 01-23-1991  Age: 30 y.o. MRN: [id number]  CC:  Chief Complaint  Patient presents with  . Follow-up    HPI  Bobby Dean patient is known to have pancytopenia, last platelet count was normal.  Patient denies any chest pain petechial rash fever chills sore throat.  He does not smoke does not drink.  Past Medical History:  Diagnosis Date  . Anxiety   . Autism   . Autism   . Autoimmune pancytopenia (HCC)     History reviewed. No pertinent surgical history.  Family History  Problem Relation Age of Onset  . Cancer Mother     Social History   Socioeconomic History  . Marital status: Single    Spouse name: Not on file  . Number of children: Not on file  . Years of education: Not on file  . Highest education level: Not on file  Occupational History  . Not on file  Tobacco Use  . Smoking status: Never Smoker  . Smokeless tobacco: Never Used  Vaping Use  . Vaping Use: Never used  Substance and Sexual Activity  . Alcohol use: No  . Drug use: No  . Sexual activity: Not Currently  Other Topics Concern  . Not on file  Social History Narrative  . Not on file   Social Determinants of Health   Financial Resource Strain: Not on file  Food Insecurity: Not on file  Transportation Needs: Not on file  Physical Activity: Not on file  Stress: Not on file  Social Connections: Not on file  Intimate Partner Violence: Not on file     Current Outpatient Medications:  .  ascorbic acid (GNP VITAMIN C) 500 MG tablet, Take 1 tablet (500 mg total) by mouth daily., Disp: 90 tablet, Rfl: 1 .  cholecalciferol (VITAMIN D) 25 MCG (1000 UNIT) tablet, TAKE 1 TABLET BY MOUTH ONCE DAILY., Disp: 30 tablet, Rfl: 0   No Known Allergies  ROS Review of Systems  Constitutional: Negative.   HENT: Negative.   Eyes: Negative.   Respiratory: Negative.   Cardiovascular: Negative.    Gastrointestinal: Negative.   Endocrine: Negative.   Genitourinary: Negative.   Musculoskeletal: Negative.   Skin: Negative.   Allergic/Immunologic: Negative.   Neurological: Negative.   Hematological: Negative.   Psychiatric/Behavioral: Negative.   All other systems reviewed and are negative.     Objective:    Physical Exam Vitals reviewed.  Constitutional:      Appearance: Normal appearance.  HENT:     Mouth/Throat:     Mouth: Mucous membranes are moist.  Eyes:     Pupils: Pupils are equal, round, and reactive to light.  Neck:     Vascular: No carotid bruit.  Cardiovascular:     Rate and Rhythm: Normal rate and regular rhythm.     Pulses: Normal pulses.     Heart sounds: Normal heart sounds.  Pulmonary:     Effort: Pulmonary effort is normal.     Breath sounds: Normal breath sounds.  Abdominal:     General: Bowel sounds are normal.     Palpations: Abdomen is soft. There is no hepatomegaly, splenomegaly or mass.     Tenderness: There is no abdominal tenderness.     Hernia: No hernia is present.  Musculoskeletal:     Cervical back: Neck supple.     Right lower leg: No edema.     Left  lower leg: No edema.  Skin:    Findings: No rash.  Neurological:     Mental Status: He is alert and oriented to person, place, and time.     Motor: No weakness.  Psychiatric:        Mood and Affect: Mood normal.        Behavior: Behavior normal.     BP 111/79   Pulse (!) 54   Ht 6\' 3"  (1.905 m)   Wt 172 lb 8 oz (78.2 kg)   BMI 21.56 kg/m  Wt Readings from Last 3 Encounters:  08/04/20 172 lb 8 oz (78.2 kg)  05/05/20 183 lb 11.2 oz (83.3 kg)  04/22/20 181 lb 1.6 oz (82.1 kg)     Health Maintenance Due  Topic Date Due  . COVID-19 Vaccine (2 - Moderna risk 4-dose series) 02/19/2020    There are no preventive care reminders to display for this patient.  Lab Results  Component Value Date   TSH 0.809 01/19/2017   Lab Results  Component Value Date   WBC 3.5 (L)  06/23/2020   HGB 15.0 06/23/2020   HCT 43.9 06/23/2020   MCV 86.1 06/23/2020   PLT 143 (L) 06/23/2020   Lab Results  Component Value Date   NA 138 04/08/2020   K 3.8 04/08/2020   CO2 32 04/08/2020   GLUCOSE 86 04/08/2020   BUN 13 04/08/2020   CREATININE 0.65 04/08/2020   BILITOT 0.6 04/08/2020   ALKPHOS 39 04/08/2020   AST 15 04/08/2020   ALT 13 04/08/2020   PROT 6.6 04/08/2020   ALBUMIN 4.1 04/08/2020   CALCIUM 9.2 04/08/2020   ANIONGAP 6 04/08/2020   No results found for: CHOL No results found for: HDL No results found for: LDLCALC No results found for: TRIG No results found for: CHOLHDL No results found for: 04/10/2020    Assessment & Plan:   Problem List Items Addressed This Visit      Hematopoietic and Hemostatic   Autoimmune pancytopenia (HCC) - Primary    Platelet count is normal.  Pancytopenia is under control.  Patient is being followed up by hematologist on a regular basis.        Other   Autism    Chronic stable problem      Anxiety    Patient is under care of a psychiatrist.         No orders of the defined types were placed in this encounter.   Follow-up: No follow-ups on file.    [id number], MD

## 2020-08-12 NOTE — Assessment & Plan Note (Addendum)
Patient is under care of a psychiatrist 

## 2020-08-12 NOTE — Assessment & Plan Note (Signed)
Platelet count is normal.  Pancytopenia is under control.  Patient is being followed up by hematologist on a regular basis.

## 2020-08-25 ENCOUNTER — Encounter: Payer: Self-pay | Admitting: Oncology

## 2020-08-25 ENCOUNTER — Inpatient Hospital Stay: Payer: Medicare Other | Attending: Oncology

## 2020-08-25 ENCOUNTER — Other Ambulatory Visit: Payer: Self-pay

## 2020-08-25 ENCOUNTER — Other Ambulatory Visit: Payer: Self-pay | Admitting: Internal Medicine

## 2020-08-25 ENCOUNTER — Inpatient Hospital Stay (HOSPITAL_BASED_OUTPATIENT_CLINIC_OR_DEPARTMENT_OTHER): Payer: Medicare Other | Admitting: Oncology

## 2020-08-25 VITALS — BP 114/83 | HR 61 | Temp 97.0°F | Resp 17 | Ht 75.0 in | Wt 173.4 lb

## 2020-08-25 DIAGNOSIS — D61818 Other pancytopenia: Secondary | ICD-10-CM

## 2020-08-25 DIAGNOSIS — Z79620 Long term (current) use of immunosuppressive biologic: Secondary | ICD-10-CM

## 2020-08-25 LAB — CBC WITH DIFFERENTIAL/PLATELET
Abs Immature Granulocytes: 0 10*3/uL (ref 0.00–0.07)
Basophils Absolute: 0 10*3/uL (ref 0.0–0.1)
Basophils Relative: 1 %
Eosinophils Absolute: 0.1 10*3/uL (ref 0.0–0.5)
Eosinophils Relative: 1 %
HCT: 40.2 % (ref 39.0–52.0)
Hemoglobin: 13.7 g/dL (ref 13.0–17.0)
Immature Granulocytes: 0 %
Lymphocytes Relative: 31 %
Lymphs Abs: 1.2 10*3/uL (ref 0.7–4.0)
MCH: 29.1 pg (ref 26.0–34.0)
MCHC: 34.1 g/dL (ref 30.0–36.0)
MCV: 85.5 fL (ref 80.0–100.0)
Monocytes Absolute: 0.4 10*3/uL (ref 0.1–1.0)
Monocytes Relative: 9 %
Neutro Abs: 2.3 10*3/uL (ref 1.7–7.7)
Neutrophils Relative %: 58 %
Platelets: 152 10*3/uL (ref 150–400)
RBC: 4.7 MIL/uL (ref 4.22–5.81)
RDW: 12.4 % (ref 11.5–15.5)
WBC: 4 10*3/uL (ref 4.0–10.5)
nRBC: 0 % (ref 0.0–0.2)

## 2020-08-25 NOTE — Progress Notes (Signed)
Hematology/Oncology Consult note [facility]  [phone] [phone]  Patient Care Team: [name], MD as PCP - General (Internal Medicine) Sindy Guadeloupe, MD as Consulting Physician (Hematology and Oncology)   Name of the patient: Bobby Dean  [id number]  [birth year]   Date of visit: 08/25/20  Diagnosis- autoimmune neutropenia and thrombocytopenia  Chief complaint/ Reason for visit-routine follow-up of autoimmune pancytopenia  Heme/Onc history: Patient is a 30 year old male was then referred to [place] for evaluation and management of thrombocytopenia. He has a history of autism and intellectual visibility and currently lives in a group home for the last 7 months. His medical decision maker is [name] from [place] office.   Blood work from 04/28/2016 showed white count of 2.1 with an H&H of 15.5/45.1 and a platelet count of 51. CMP was within normal limits. TSH was normal at 1.09. At this time I do not have any prior CBCs for comparison.   Bone marrow biopsy on 05/14/2016 showed normal trilineage hematopoiesis with no evidence of lymphoma or leukemia. Peripheral flow cytometry showed reversal of CD4 to CD8 ratio. HIV hepatitis B and hepatitis C testing was negative. B12 and folate were within normal limits. PT/PTT INR was within normal limits.   Review of peripheral smear showed leukopenia and moderate neutropenia. Thrombocytopenia. RBC morphology is normal. Scattered reactive appearing lymphocytes are seen. No blasts or early forms noted.   Monospot testing was positive. ANA was negative. Quantitative immunoglobulins were within normal limits. Serum copper was within normal limits. CMV DNA PCR negative   Patient was started on empiric high dose steroids for possible autoimmune etiology on 05/28/16. His counts normalized. Then his steroid taper was started. His WBC remained normal but his platelets began to decline 60's.    Plan was to restart full dose steroids and switch to rituxan second line after receiving pre splenectomy vaccines in anticipation of splenectomy should he not respond to rituxan   Patient has received all his pre splenectomy vaccines. He started rituxan on 10/14/16. Seen at [facility] for second opinion by [name] who agrees with the plan.   Recurrent neutropenia/ TCP noted in sept 2019. rituxan and steroids restarted on 12/15/17 and counts normalized.  Patient has been requiring Rituxan roughly every 8 to 12 months    Interval history-patient is doing well overall and denies any specific complaints at this time.  Appetite and weight have been stable.  Denies any bleeding or bruising.  ECOG PS- 0 Pain scale- 0   Review of systems- Review of Systems  Constitutional:  Negative for chills, fever, malaise/fatigue and weight loss.  HENT:  Negative for congestion, ear discharge and nosebleeds.   Eyes:  Negative for blurred vision.  Respiratory:  Negative for cough, hemoptysis, sputum production, shortness of breath and wheezing.   Cardiovascular:  Negative for chest pain, palpitations, orthopnea and claudication.  Gastrointestinal:  Negative for abdominal pain, blood in stool, constipation, diarrhea, heartburn, melena, nausea and vomiting.  Genitourinary:  Negative for dysuria, flank pain, frequency, hematuria and urgency.  Musculoskeletal:  Negative for back pain, joint pain and myalgias.  Skin:  Negative for rash.  Neurological:  Negative for dizziness, tingling, focal weakness, seizures, weakness and headaches.  Endo/Heme/Allergies:  Does not bruise/bleed easily.  Psychiatric/Behavioral:  Negative for depression and suicidal ideas. The patient does not have insomnia.     No Known Allergies   Past Medical History:  Diagnosis Date   Anxiety    Autism  Autism    Autoimmune pancytopenia ([facility])      No past surgical history on file.  Social History   Socioeconomic History   Marital  status: Single    Spouse name: Not on file   Number of children: Not on file   Years of education: Not on file   Highest education level: Not on file  Occupational History   Not on file  Tobacco Use   Smoking status: Never   Smokeless tobacco: Never  Vaping Use   Vaping Use: Never used  Substance and Sexual Activity   Alcohol use: No   Drug use: No   Sexual activity: Not Currently  Other Topics Concern   Not on file  Social History Narrative   Not on file   Social Determinants of Health   Financial Resource Strain: Not on file  Food Insecurity: Not on file  Transportation Needs: Not on file  Physical Activity: Not on file  Stress: Not on file  Social Connections: Not on file  Intimate Partner Violence: Not on file    Family History  Problem Relation Age of Onset   Cancer Mother      Current Outpatient Medications:    ascorbic acid (GNP VITAMIN C) 500 MG tablet, Take 1 tablet (500 mg total) by mouth daily., Disp: 90 tablet, Rfl: 1   cholecalciferol (VITAMIN D) 25 MCG (1000 UNIT) tablet, TAKE 1 TABLET BY MOUTH ONCE DAILY., Disp: 30 tablet, Rfl: 0  Physical exam:  Vitals:   08/25/20 1102  BP: 114/83  Pulse: 61  Resp: 17  Temp: (!) 97 F (36.1 C)  SpO2: 100%  Weight: 173 lb 6.4 oz (78.7 kg)  Height: 6' 3"  (1.905 m)   Physical Exam Constitutional:      General: He is not in acute distress. Cardiovascular:     Rate and Rhythm: Normal rate and regular rhythm.     Heart sounds: Normal heart sounds.  Pulmonary:     Effort: Pulmonary effort is normal.     Breath sounds: Normal breath sounds.  Skin:    General: Skin is warm and dry.  Neurological:     Mental Status: He is alert and oriented to person, place, and time.     CMP Latest Ref Rng & Units 04/08/2020  Glucose 70 - 99 mg/dL 86  BUN 6 - 20 mg/dL 13  Creatinine 0.61 - 1.24 mg/dL 0.65  Sodium 135 - 145 mmol/L 138  Potassium 3.5 - 5.1 mmol/L 3.8  Chloride 98 - 111 mmol/L 100  CO2 22 - 32 mmol/L 32   Calcium 8.9 - 10.3 mg/dL 9.2  Total Protein 6.5 - 8.1 g/dL 6.6  Total Bilirubin 0.3 - 1.2 mg/dL 0.6  Alkaline Phos 38 - 126 U/L 39  AST 15 - 41 U/L 15  ALT 0 - 44 U/L 13   CBC Latest Ref Rng & Units 08/25/2020  WBC 4.0 - 10.5 K/uL 4.0  Hemoglobin 13.0 - 17.0 g/dL 13.7  Hematocrit 39.0 - 52.0 % 40.2  Platelets 150 - 400 K/uL 152     Assessment and plan- Patient is a 30 y.o. male with autoimmune pancytopenia here for routine follow-up  Patient received 4 doses of Rituxan in December 2021.  Presently his counts are stable with normal white count and platelet count which we will continue to monitor.  Repeat CBC with differential in 3 months in 6 months and I will see him back in 6 months   Visit Diagnosis 1. Autoimmune  pancytopenia [facility])      [name], MD, MPH [facility], [facility], [facility] [phone] 08/25/2020 5:14 PM

## 2020-09-16 ENCOUNTER — Other Ambulatory Visit: Payer: Self-pay | Admitting: Internal Medicine

## 2020-10-18 ENCOUNTER — Other Ambulatory Visit: Payer: Self-pay | Admitting: Internal Medicine

## 2020-11-05 ENCOUNTER — Ambulatory Visit: Payer: Medicare Other | Admitting: Internal Medicine

## 2020-11-25 ENCOUNTER — Inpatient Hospital Stay: Payer: Medicare Other | Attending: Oncology

## 2020-11-25 DIAGNOSIS — D61818 Other pancytopenia: Secondary | ICD-10-CM | POA: Diagnosis not present

## 2020-11-25 LAB — CBC WITH DIFFERENTIAL/PLATELET
Abs Immature Granulocytes: 0 10*3/uL (ref 0.00–0.07)
Basophils Absolute: 0 10*3/uL (ref 0.0–0.1)
Basophils Relative: 1 %
Eosinophils Absolute: 0.2 10*3/uL (ref 0.0–0.5)
Eosinophils Relative: 5 %
HCT: 42.3 % (ref 39.0–52.0)
Hemoglobin: 14.4 g/dL (ref 13.0–17.0)
Immature Granulocytes: 0 %
Lymphocytes Relative: 35 %
Lymphs Abs: 1.5 10*3/uL (ref 0.7–4.0)
MCH: 29.6 pg (ref 26.0–34.0)
MCHC: 34 g/dL (ref 30.0–36.0)
MCV: 87 fL (ref 80.0–100.0)
Monocytes Absolute: 0.4 10*3/uL (ref 0.1–1.0)
Monocytes Relative: 10 %
Neutro Abs: 2.1 10*3/uL (ref 1.7–7.7)
Neutrophils Relative %: 49 %
Platelets: 147 10*3/uL — ABNORMAL LOW (ref 150–400)
RBC: 4.86 MIL/uL (ref 4.22–5.81)
RDW: 12.9 % (ref 11.5–15.5)
WBC: 4.3 10*3/uL (ref 4.0–10.5)
nRBC: 0 % (ref 0.0–0.2)

## 2020-12-16 ENCOUNTER — Other Ambulatory Visit: Payer: Self-pay

## 2020-12-16 ENCOUNTER — Ambulatory Visit (INDEPENDENT_AMBULATORY_CARE_PROVIDER_SITE_OTHER): Payer: Medicare Other | Admitting: Internal Medicine

## 2020-12-16 ENCOUNTER — Encounter: Payer: Self-pay | Admitting: Internal Medicine

## 2020-12-16 VITALS — BP 119/72 | HR 54 | Ht 75.0 in | Wt 173.4 lb

## 2020-12-16 DIAGNOSIS — F419 Anxiety disorder, unspecified: Secondary | ICD-10-CM | POA: Diagnosis not present

## 2020-12-16 DIAGNOSIS — Z23 Encounter for immunization: Secondary | ICD-10-CM

## 2020-12-16 DIAGNOSIS — D61818 Other pancytopenia: Secondary | ICD-10-CM

## 2020-12-16 DIAGNOSIS — F840 Autistic disorder: Secondary | ICD-10-CM

## 2020-12-16 NOTE — Assessment & Plan Note (Signed)
Stable

## 2020-12-16 NOTE — Progress Notes (Signed)
Established Patient Office Visit  Subjective:  Patient ID: Bobby Dean, male    DOB: [birth year]  Age: 30 y.o. MRN: [id number]  CC: No chief complaint on file.   HPI  Rolland Steinert presents for insomnia, patient is little hyperactive only sleep 3 to 4 hours at night.  He has a problem with thrombocytopenia and being followed up by hematologist Past Medical History:  Diagnosis Date   Anxiety    Autism    Autism    Autoimmune pancytopenia (HCC)     History reviewed. No pertinent surgical history.  Family History  Problem Relation Age of Onset   Cancer Mother     Social History   Socioeconomic History   Marital status: Single    Spouse name: Not on file   Number of children: Not on file   Years of education: Not on file   Highest education level: Not on file  Occupational History   Not on file  Tobacco Use   Smoking status: Never   Smokeless tobacco: Never  Vaping Use   Vaping Use: Never used  Substance and Sexual Activity   Alcohol use: No   Drug use: No   Sexual activity: Not Currently  Other Topics Concern   Not on file  Social History Narrative   Not on file   Social Determinants of Health   Financial Resource Strain: Not on file  Food Insecurity: Not on file  Transportation Needs: Not on file  Physical Activity: Not on file  Stress: Not on file  Social Connections: Not on file  Intimate Partner Violence: Not on file     Current Outpatient Medications:    ascorbic acid (GNP VITAMIN C) 500 MG tablet, Take 1 tablet (500 mg total) by mouth daily., Disp: 90 tablet, Rfl: 1   cholecalciferol (VITAMIN D) 25 MCG (1000 UNIT) tablet, TAKE 1 TABLET BY MOUTH ONCE DAILY., Disp: 30 tablet, Rfl: 0   No Known Allergies  ROS Review of Systems  Constitutional: Negative.   HENT: Negative.    Eyes: Negative.   Respiratory: Negative.    Cardiovascular: Negative.   Gastrointestinal: Negative.   Endocrine: Negative.   Genitourinary: Negative.    Musculoskeletal: Negative.   Skin: Negative.   Allergic/Immunologic: Negative.   Neurological: Negative.   Hematological: Negative.   Psychiatric/Behavioral: Negative.    All other systems reviewed and are negative.    Objective:    Physical Exam Vitals reviewed.  Constitutional:      Appearance: Normal appearance.  HENT:     Mouth/Throat:     Mouth: Mucous membranes are moist.  Eyes:     Pupils: Pupils are equal, round, and reactive to light.  Neck:     Vascular: No carotid bruit.  Cardiovascular:     Rate and Rhythm: Normal rate and regular rhythm.     Pulses: Normal pulses.     Heart sounds: Normal heart sounds.  Pulmonary:     Effort: Pulmonary effort is normal.     Breath sounds: Normal breath sounds.  Abdominal:     General: Bowel sounds are normal.     Palpations: Abdomen is soft. There is no hepatomegaly, splenomegaly or mass.     Tenderness: There is no abdominal tenderness.     Hernia: No hernia is present.  Musculoskeletal:     Cervical back: Neck supple.     Right lower leg: No edema.     Left lower leg: No edema.  Skin:    Findings:  No rash.  Neurological:     Mental Status: He is alert and oriented to person, place, and time.     Motor: No weakness.  Psychiatric:        Mood and Affect: Mood normal.        Behavior: Behavior normal.    BP 119/72   Pulse (!) 54   Ht 6\' 3"  (1.905 m)   Wt 173 lb 6.4 oz (78.7 kg)   BMI 21.67 kg/m  Wt Readings from Last 3 Encounters:  12/16/20 173 lb 6.4 oz (78.7 kg)  08/25/20 173 lb 6.4 oz (78.7 kg)  08/04/20 172 lb 8 oz (78.2 kg)     Health Maintenance Due  Topic Date Due   COVID-19 Vaccine (2 - Moderna risk series) 02/19/2020    There are no preventive care reminders to display for this patient.  Lab Results  Component Value Date   TSH 0.809 01/19/2017   Lab Results  Component Value Date   WBC 4.3 11/25/2020   HGB 14.4 11/25/2020   HCT 42.3 11/25/2020   MCV 87.0 11/25/2020   PLT 147 (L)  11/25/2020   Lab Results  Component Value Date   NA 138 04/08/2020   K 3.8 04/08/2020   CO2 32 04/08/2020   GLUCOSE 86 04/08/2020   BUN 13 04/08/2020   CREATININE 0.65 04/08/2020   BILITOT 0.6 04/08/2020   ALKPHOS 39 04/08/2020   AST 15 04/08/2020   ALT 13 04/08/2020   PROT 6.6 04/08/2020   ALBUMIN 4.1 04/08/2020   CALCIUM 9.2 04/08/2020   ANIONGAP 6 04/08/2020   No results found for: CHOL No results found for: HDL No results found for: LDLCALC No results found for: TRIG No results found for: CHOLHDL No results found for: 04/10/2020    Assessment & Plan:   Problem List Items Addressed This Visit       Hematopoietic and Hemostatic   Autoimmune pancytopenia (HCC)    Stable at the present time        Other   Autism    Stable at the present time except for insomnia      Anxiety    Stable      Need for COVID-19 vaccine    Patient COVID shots up-to-date      Other Visit Diagnoses     Need for influenza vaccination    -  Primary   Relevant Orders   Flu Vaccine QUAD 6+ mos PF IM (Fluarix Quad PF) (Completed)     Patient will be referred to psychiatrist for insomnia, it is possible he may have an underlying bipolar disorder  No orders of the defined types were placed in this encounter.   Follow-up: No follow-ups on file.    [id number], MD

## 2020-12-16 NOTE — Assessment & Plan Note (Signed)
Patient COVID shots up-to-date

## 2020-12-16 NOTE — Progress Notes (Signed)
flu

## 2020-12-16 NOTE — Assessment & Plan Note (Signed)
Stable at the present time except for insomnia

## 2020-12-16 NOTE — Assessment & Plan Note (Signed)
Stable at the present time. 

## 2021-01-07 DIAGNOSIS — Z23 Encounter for immunization: Secondary | ICD-10-CM | POA: Diagnosis not present

## 2021-01-21 ENCOUNTER — Encounter: Payer: Medicare Other | Admitting: Internal Medicine

## 2021-01-23 ENCOUNTER — Ambulatory Visit (INDEPENDENT_AMBULATORY_CARE_PROVIDER_SITE_OTHER): Payer: Medicare Other | Admitting: *Deleted

## 2021-01-23 VITALS — BP 122/84 | HR 78 | Temp 98.0°F | Ht 75.0 in | Wt 175.0 lb

## 2021-01-23 DIAGNOSIS — Z0000 Encounter for general adult medical examination without abnormal findings: Secondary | ICD-10-CM

## 2021-01-25 NOTE — Progress Notes (Signed)
Subjective:   Bobby Dean is a 30 y.o. male who presents for Medicare Annual/Subsequent preventive examination. I discussed the limitations of evaluation and management by telemedicine and the availability of in person appointments. Patient expressed understanding and agreed to proceed.   Visit performed using audio  Patient:home Provider:home   Review of Systems    Defer to prvider Cardiac Risk Factors include: male gender     Objective:    Today's Vitals   01/25/21 2058  BP: 122/84  Pulse: 78  Temp: 98 F (36.7 C)  Weight: 175 lb (79.4 kg)  Height: 6\' 3"  (1.905 m)   Body mass index is 21.87 kg/m.  Advanced Directives 01/25/2021 03/25/2020 02/29/2020 02/12/2020 10/08/2019 06/12/2019 03/19/2019  Does Patient Have a Medical Advance Directive? Yes Unable to assess, patient is non-responsive or altered mental status No No No No No  Type of Advance Directive Healthcare Power of Attorney - - - - - -  Does patient want to make changes to medical advance directive? - No - Patient declined No - Patient declined No - Patient declined - No - Patient declined No - Patient declined  Would patient like information on creating a medical advance directive? - - No - Patient declined No - Patient declined - No - Patient declined No - Patient declined    Current Medications (verified) Outpatient Encounter Medications as of 01/23/2021  Medication Sig   [DISCONTINUED] ascorbic acid (GNP VITAMIN C) 500 MG tablet Take 1 tablet (500 mg total) by mouth daily.   [DISCONTINUED] cholecalciferol (VITAMIN D) 25 MCG (1000 UNIT) tablet TAKE 1 TABLET BY MOUTH ONCE DAILY.   No facility-administered encounter medications on file as of 01/23/2021.    Allergies (verified) Patient has no known allergies.   History: Past Medical History:  Diagnosis Date   Anxiety    Autism    Autism    Autoimmune pancytopenia (HCC)    History reviewed. No pertinent surgical history. Family History  Problem  Relation Age of Onset   Cancer Mother    Social History   Socioeconomic History   Marital status: Single    Spouse name: Not on file   Number of children: Not on file   Years of education: Not on file   Highest education level: Not on file  Occupational History   Not on file  Tobacco Use   Smoking status: Never   Smokeless tobacco: Never  Vaping Use   Vaping Use: Never used  Substance and Sexual Activity   Alcohol use: No   Drug use: No   Sexual activity: Not Currently  Other Topics Concern   Not on file  Social History Narrative   Not on file   Social Determinants of Health   Financial Resource Strain: Low Risk    Difficulty of Paying Living Expenses: Not hard at all  Food Insecurity: No Food Insecurity   Worried About 13/01/2021 in the Last Year: Never true   Ran Out of Food in the Last Year: Never true  Transportation Needs: No Transportation Needs   Lack of Transportation (Medical): No   Lack of Transportation (Non-Medical): No  Physical Activity: Sufficiently Active   Days of Exercise per Week: 4 days   Minutes of Exercise per Session: 40 min  Stress: No Stress Concern Present   Feeling of Stress : Not at all  Social Connections: Moderately Isolated   Frequency of Communication with Friends and Family: More than three times a week  Frequency of Social Gatherings with Friends and Family: More than three times a week   Attends Religious Services: Never   Marine scientist or Organizations: Yes   Attends Music therapist: More than 4 times per year   Marital Status: Never married    Tobacco Counseling Counseling given: Not Answered   Clinical Intake:  Pre-visit preparation completed: Yes  Pain : No/denies pain     Nutritional Risks: None Diabetes: No  How often do you need to have someone help you when you read instructions, pamphlets, or other written materials from your doctor or pharmacy?: 4 - Often What is the last  grade level you completed in school?: 12  Diabetic?No  Interpreter Needed?: No  Information entered by :: Dominica Severin   Activities of Daily Living In your present state of health, do you have any difficulty performing the following activities: 01/25/2021  Hearing? N  Vision? N  Difficulty concentrating or making decisions? Y  Walking or climbing stairs? N  Dressing or bathing? N  Doing errands, shopping? Y  Preparing Food and eating ? Y  Comment preparing food  Using the Toilet? N  In the past six months, have you accidently leaked urine? N  Do you have problems with loss of bowel control? N  Managing your Medications? N  Managing your Finances? N  Housekeeping or managing your Housekeeping? N  Some recent data might be hidden    Patient Care Team: [name], MD as PCP - General (Internal Medicine) Sindy Guadeloupe, MD as Consulting Physician (Hematology and Oncology)  Indicate any recent Medical Services you may have received from other than [facility] providers in the past year (date may be approximate).     Assessment:   This is a routine wellness examination for Bobby Dean.  Hearing/Vision screen No results found.  Dietary issues and exercise activities discussed: Current Exercise Habits: Home exercise routine, Type of exercise: walking;stretching, Time (Minutes): 40, Frequency (Times/Week): 4, Weekly Exercise (Minutes/Week): 160, Intensity: Mild, Exercise limited by: None identified   Goals Addressed   None    Depression Screen PHQ 2/9 Scores 01/25/2021 01/25/2021 01/22/2020  PHQ - 2 Score 0 0 0    Fall Risk Fall Risk  01/25/2021 01/22/2020 10/14/2016  Falls in the past year? 0 0 No  Number falls in past yr: 0 0 -  Injury with Fall? 0 0 -  Risk for fall due to : No Fall Risks - -  Follow up Falls evaluation completed - -    FALL RISK PREVENTION PERTAINING TO THE HOME:  Any stairs in or around the home? No  If so, are there any without handrails? No   Home free of loose throw rugs in walkways, pet beds, electrical cords, etc? Yes  Adequate lighting in your home to reduce risk of falls? Yes   ASSISTIVE DEVICES UTILIZED TO PREVENT FALLS:  Life alert? No  Use of a cane, walker or w/c? No  Grab bars in the bathroom? No  Shower chair or bench in shower? No  Elevated toilet seat or a handicapped toilet? No   TIMED UP AND GO:  Was the test performed? No .  Length of time to ambulate - NA    Gait steady and fast without use of assistive device  Cognitive Function: MMSE - Mini Mental State Exam 01/25/2021  Not completed: Unable to complete        Immunizations Immunization History  Administered Date(s) Administered   HiB (PRP-T)  07/29/2016   Influenza,inj,Quad PF,6+ Mos 01/07/2020, 12/16/2020   Influenza-Unspecified 12/11/2018   Meningococcal Mcv4o 07/29/2016, 09/30/2016   Moderna SARS-COV2 Booster Vaccination 01/22/2020, 01/07/2021   Pneumococcal Conjugate-13 07/29/2016   Pneumococcal Polysaccharide-23 09/30/2016   Tdap 08/12/2016   Varicella 08/12/2016    TDAP status: Up to date  Flu Vaccine status: Up to date  Pneumococcal vaccine status: Up to date  Covid-19 vaccine status: Completed vaccines  Qualifies for Shingles Vaccine? No   Zostavax completed No     Screening Tests Health Maintenance  Topic Date Due   Pneumococcal Vaccine 54-52 Years old (3 - PPSV23 if available, else PCV20) 09/30/2021   TETANUS/TDAP  08/13/2026   INFLUENZA VACCINE  Completed   Hepatitis C Screening  Completed   HIV Screening  Completed   HPV VACCINES  Aged Out    Health Maintenance  There are no preventive care reminders to display for this patient.  Does not qualify for  colonoscopy   Lung Cancer Screening: (Low Dose CT Chest recommended if Age 48-80 years, 30 pack-year currently smoking OR have quit w/in 15years.) does not qualify.   Lung Cancer Screening Referral: NA   Additional Screening:  Hepatitis C Screening:  does not qualify; Completed 05/14/2016  Vision Screening: Recommended annual ophthalmology exams for early detection of glaucoma and other disorders of the eye. Is the patient up to date with their annual eye exam?  No  Who is the provider or what is the name of the office in which the patient attends annual eye exams? no If pt is not established with a provider, would they like to be referred to a provider to establish care? No .   Dental Screening: Recommended annual dental exams for proper oral hygiene  Community Resource Referral / Chronic Care Management: CRR required this visit?  No   CCM required this visit?  No      Plan:     I have personally reviewed and noted the following in the patient's chart:   Medical and social history Use of alcohol, tobacco or illicit drugs  Current medications and supplements including opioid prescriptions. Patient is not currently taking opioid prescriptions. Functional ability and status Nutritional status Physical activity Advanced directives List of other physicians Hospitalizations, surgeries, and ER visits in previous 12 months Vitals Screenings to include cognitive, depression, and falls Referrals and appointments  In addition, I have reviewed and discussed with patient certain preventive protocols, quality metrics, and best practice recommendations. A written personalized care plan for preventive services as well as general preventive health recommendations were provided to patient.     Lacretia Nicks, [place]   01/25/2021   Nurse Notes:  Bobby Dean , Thank you for taking time to come for your Medicare Wellness Visit. I appreciate your ongoing commitment to your health goals. Please review the following plan we discussed and let me know if I can assist you in the future.   Patient will come next week for labs  These are the goals we discussed  Goals   None     This is a list of the screening recommended for you and due  dates:  Health Maintenance  Topic Date Due   Pneumococcal Vaccination (3 - PPSV23 if available, else PCV20) 09/30/2021   Tetanus Vaccine  08/13/2026   Flu Shot  Completed   Hepatitis C Screening: USPSTF Recommendation to screen - Ages 18-79 yo.  Completed   HIV Screening  Completed   HPV Vaccine  Aged Out  Time spent 25 min

## 2021-01-27 NOTE — Progress Notes (Signed)
I have reviewed this visit and agree with the documentation.   

## 2021-01-28 ENCOUNTER — Encounter: Payer: Medicare Other | Admitting: Internal Medicine

## 2021-01-28 ENCOUNTER — Other Ambulatory Visit: Payer: Self-pay

## 2021-01-29 ENCOUNTER — Ambulatory Visit (INDEPENDENT_AMBULATORY_CARE_PROVIDER_SITE_OTHER): Payer: Medicare Other | Admitting: *Deleted

## 2021-01-29 ENCOUNTER — Other Ambulatory Visit: Payer: Self-pay

## 2021-01-29 DIAGNOSIS — D61818 Other pancytopenia: Secondary | ICD-10-CM

## 2021-01-29 DIAGNOSIS — Z114 Encounter for screening for human immunodeficiency virus [HIV]: Secondary | ICD-10-CM | POA: Diagnosis not present

## 2021-01-29 DIAGNOSIS — Z0000 Encounter for general adult medical examination without abnormal findings: Secondary | ICD-10-CM | POA: Diagnosis not present

## 2021-01-30 ENCOUNTER — Encounter: Payer: Self-pay | Admitting: Oncology

## 2021-01-30 LAB — CBC WITH DIFFERENTIAL/PLATELET
Absolute Monocytes: 305 cells/uL (ref 200–950)
Basophils Absolute: 20 cells/uL (ref 0–200)
Basophils Relative: 0.7 %
Eosinophils Absolute: 41 cells/uL (ref 15–500)
Eosinophils Relative: 1.4 %
HCT: 45.3 % (ref 38.5–50.0)
Hemoglobin: 15.4 g/dL (ref 13.2–17.1)
Lymphs Abs: 1006 cells/uL (ref 850–3900)
MCH: 29.6 pg (ref 27.0–33.0)
MCHC: 34 g/dL (ref 32.0–36.0)
MCV: 86.9 fL (ref 80.0–100.0)
MPV: 11.6 fL (ref 7.5–12.5)
Monocytes Relative: 10.5 %
Neutro Abs: 1528 cells/uL (ref 1500–7800)
Neutrophils Relative %: 52.7 %
Platelets: 147 10*3/uL (ref 140–400)
RBC: 5.21 10*6/uL (ref 4.20–5.80)
RDW: 12.4 % (ref 11.0–15.0)
Total Lymphocyte: 34.7 %
WBC: 2.9 10*3/uL — ABNORMAL LOW (ref 3.8–10.8)

## 2021-01-30 LAB — COMPLETE METABOLIC PANEL WITH GFR
AG Ratio: 2 (calc) (ref 1.0–2.5)
ALT: 11 U/L (ref 9–46)
AST: 15 U/L (ref 10–40)
Albumin: 4.4 g/dL (ref 3.6–5.1)
Alkaline phosphatase (APISO): 63 U/L (ref 36–130)
BUN: 9 mg/dL (ref 7–25)
CO2: 27 mmol/L (ref 20–32)
Calcium: 9.2 mg/dL (ref 8.6–10.3)
Chloride: 102 mmol/L (ref 98–110)
Creat: 0.7 mg/dL (ref 0.60–1.24)
Globulin: 2.2 g/dL (calc) (ref 1.9–3.7)
Glucose, Bld: 81 mg/dL (ref 65–99)
Potassium: 4 mmol/L (ref 3.5–5.3)
Sodium: 140 mmol/L (ref 135–146)
Total Bilirubin: 0.9 mg/dL (ref 0.2–1.2)
Total Protein: 6.6 g/dL (ref 6.1–8.1)
eGFR: 129 mL/min/{1.73_m2} (ref >=60)

## 2021-01-30 LAB — LIPID PANEL
Cholesterol: 167 mg/dL (ref <200)
HDL: 59 mg/dL (ref >=40)
LDL Cholesterol (Calc): 93 mg/dL (calc)
Non-HDL Cholesterol (Calc): 108 mg/dL (calc) (ref <130)
Total CHOL/HDL Ratio: 2.8 (calc) (ref <5.0)
Triglycerides: 60 mg/dL (ref <150)

## 2021-01-30 LAB — TSH: TSH: 0.58 mIU/L (ref 0.40–4.50)

## 2021-01-30 LAB — HIV ANTIBODY (ROUTINE TESTING W REFLEX): HIV 1&2 Ab, 4th Generation: NONREACTIVE

## 2021-02-24 ENCOUNTER — Other Ambulatory Visit: Payer: Self-pay

## 2021-02-24 ENCOUNTER — Inpatient Hospital Stay: Payer: Medicare Other | Attending: Oncology

## 2021-02-24 ENCOUNTER — Encounter: Payer: Self-pay | Admitting: Oncology

## 2021-02-24 ENCOUNTER — Inpatient Hospital Stay (HOSPITAL_BASED_OUTPATIENT_CLINIC_OR_DEPARTMENT_OTHER): Payer: Medicare Other | Admitting: Oncology

## 2021-02-24 ENCOUNTER — Other Ambulatory Visit: Payer: Self-pay | Admitting: *Deleted

## 2021-02-24 VITALS — BP 125/77 | HR 61 | Temp 97.3°F | Ht 75.0 in | Wt 176.8 lb

## 2021-02-24 DIAGNOSIS — D61818 Other pancytopenia: Secondary | ICD-10-CM | POA: Diagnosis not present

## 2021-02-24 DIAGNOSIS — F419 Anxiety disorder, unspecified: Secondary | ICD-10-CM | POA: Insufficient documentation

## 2021-02-24 DIAGNOSIS — F840 Autistic disorder: Secondary | ICD-10-CM | POA: Insufficient documentation

## 2021-02-24 LAB — CBC WITH DIFFERENTIAL/PLATELET
Abs Immature Granulocytes: 0 10*3/uL (ref 0.00–0.07)
Basophils Absolute: 0 10*3/uL (ref 0.0–0.1)
Basophils Relative: 1 %
Eosinophils Absolute: 0.1 10*3/uL (ref 0.0–0.5)
Eosinophils Relative: 2 %
HCT: 46.5 % (ref 39.0–52.0)
Hemoglobin: 15.5 g/dL (ref 13.0–17.0)
Immature Granulocytes: 0 %
Lymphocytes Relative: 39 %
Lymphs Abs: 1.4 10*3/uL (ref 0.7–4.0)
MCH: 29.4 pg (ref 26.0–34.0)
MCHC: 33.3 g/dL (ref 30.0–36.0)
MCV: 88.1 fL (ref 80.0–100.0)
Monocytes Absolute: 0.3 10*3/uL (ref 0.1–1.0)
Monocytes Relative: 8 %
Neutro Abs: 1.8 10*3/uL (ref 1.7–7.7)
Neutrophils Relative %: 50 %
Platelets: 134 10*3/uL — ABNORMAL LOW (ref 150–400)
RBC: 5.28 MIL/uL (ref 4.22–5.81)
RDW: 12.6 % (ref 11.5–15.5)
WBC: 3.6 10*3/uL — ABNORMAL LOW (ref 4.0–10.5)
nRBC: 0 % (ref 0.0–0.2)

## 2021-02-24 NOTE — Progress Notes (Signed)
Hematology/Oncology Consult note [facility]  [phone] [phone]  Patient Care Team: [name], MD as PCP - General (Internal Medicine) Sindy Guadeloupe, MD as Consulting Physician (Hematology and Oncology)   Name of the patient: Bobby Dean  [id number]  1990/09/16   Date of visit: 02/24/21  Diagnosis- autoimmune neutropenia and thrombocytopenia  Chief complaint/ Reason for visit-routine follow-up of autoimmune pancytopenia  Heme/Onc history: Patient is a 30 year old male was then referred to [place] for evaluation and management of thrombocytopenia. He has a history of autism and intellectual visibility and currently lives in a group home for the last 7 months. His medical decision maker is [name] from [place] office.   Blood work from 04/28/2016 showed white count of 2.1 with an H&H of 15.5/45.1 and a platelet count of 51. CMP was within normal limits. TSH was normal at 1.09. At this time I do not have any prior CBCs for comparison.   Bone marrow biopsy on 05/14/2016 showed normal trilineage hematopoiesis with no evidence of lymphoma or leukemia. Peripheral flow cytometry showed reversal of CD4 to CD8 ratio. HIV hepatitis B and hepatitis C testing was negative. B12 and folate were within normal limits. PT/PTT INR was within normal limits.   Review of peripheral smear showed leukopenia and moderate neutropenia. Thrombocytopenia. RBC morphology is normal. Scattered reactive appearing lymphocytes are seen. No blasts or early forms noted.   Monospot testing was positive. ANA was negative. Quantitative immunoglobulins were within normal limits. Serum copper was within normal limits. CMV DNA PCR negative   Patient was started on empiric high dose steroids for possible autoimmune etiology on 05/28/16. His counts normalized. Then his steroid taper was started. His WBC remained normal but his platelets began to decline 60's.    Plan was to restart full dose steroids and switch to rituxan second line after receiving pre splenectomy vaccines in anticipation of splenectomy should he not respond to rituxan   Patient has received all his pre splenectomy vaccines. He started rituxan on 10/14/16. Seen at [facility] for second opinion by [name] who agrees with the plan.   Recurrent neutropenia/ TCP noted in sept 2019. rituxan and steroids restarted on 12/15/17 and counts normalized.  Patient has been requiring Rituxan roughly every 8 to 12 months   Interval history-presents today with his group home leader. He is doing well. No recent infections.   ECOG PS- 0 Pain scale- 0   Review of systems- Review of Systems  All other systems reviewed and are negative.   No Known Allergies   Past Medical History:  Diagnosis Date   Anxiety    Autism    Autism    Autoimmune pancytopenia ([place])      History reviewed. No pertinent surgical history.  Social History   Socioeconomic History   Marital status: Single    Spouse name: Not on file   Number of children: Not on file   Years of education: Not on file   Highest education level: Not on file  Occupational History   Not on file  Tobacco Use   Smoking status: Never   Smokeless tobacco: Never  Vaping Use   Vaping Use: Never used  Substance and Sexual Activity   Alcohol use: No   Drug use: No   Sexual activity: Not Currently  Other Topics Concern   Not on file  Social History Narrative   Not on file   Social Determinants of Health  Financial Resource Strain: Low Risk    Difficulty of Paying Living Expenses: Not hard at all  Food Insecurity: No Food Insecurity   Worried About Charity fundraiser in the Last Year: Never true   Ran Out of Food in the Last Year: Never true  Transportation Needs: No Transportation Needs   Lack of Transportation (Medical): No   Lack of Transportation (Non-Medical): No  Physical Activity: Sufficiently Active   Days of Exercise  per Week: 4 days   Minutes of Exercise per Session: 40 min  Stress: No Stress Concern Present   Feeling of Stress : Not at all  Social Connections: Moderately Isolated   Frequency of Communication with Friends and Family: More than three times a week   Frequency of Social Gatherings with Friends and Family: More than three times a week   Attends Religious Services: Never   Marine scientist or Organizations: Yes   Attends Music therapist: More than 4 times per year   Marital Status: Never married  Human resources officer Violence: Not At Risk   Fear of Current or Ex-Partner: No   Emotionally Abused: No   Physically Abused: No   Sexually Abused: No    Family History  Problem Relation Age of Onset   Cancer Mother     No current outpatient medications on file.  Physical exam:  Vitals:   02/24/21 1026  BP: 125/77  Pulse: 61  Temp: (!) 97.3 F (36.3 C)  TempSrc: Tympanic  SpO2: 100%  Weight: 176 lb 12.8 oz (80.2 kg)  Height: 6' 3" (1.905 m)   Physical Exam Constitutional:      Appearance: Normal appearance.  HENT:     Head: Normocephalic and atraumatic.  Eyes:     Pupils: Pupils are equal, round, and reactive to light.  Cardiovascular:     Rate and Rhythm: Normal rate and regular rhythm.     Heart sounds: Normal heart sounds. No murmur heard. Pulmonary:     Effort: Pulmonary effort is normal.     Breath sounds: Normal breath sounds. No wheezing.  Abdominal:     General: Bowel sounds are normal. There is no distension.     Palpations: Abdomen is soft.     Tenderness: There is no abdominal tenderness.  Musculoskeletal:        General: Normal range of motion.     Cervical back: Normal range of motion.  Skin:    General: Skin is warm and dry.     Findings: No rash.  Neurological:     Mental Status: He is alert and oriented to person, place, and time.  Psychiatric:        Judgment: Judgment normal.     CMP Latest Ref Rng & Units 01/29/2021  Glucose  65 - 99 mg/dL 81  BUN 7 - 25 mg/dL 9  Creatinine 0.60 - 1.24 mg/dL 0.70  Sodium 135 - 146 mmol/L 140  Potassium 3.5 - 5.3 mmol/L 4.0  Chloride 98 - 110 mmol/L 102  CO2 20 - 32 mmol/L 27  Calcium 8.6 - 10.3 mg/dL 9.2  Total Protein 6.1 - 8.1 g/dL 6.6  Total Bilirubin 0.2 - 1.2 mg/dL 0.9  Alkaline Phos 38 - 126 U/L -  AST 10 - 40 U/L 15  ALT 9 - 46 U/L 11   CBC Latest Ref Rng & Units 02/24/2021  WBC 4.0 - 10.5 K/uL 3.6(L)  Hemoglobin 13.0 - 17.0 g/dL 15.5  Hematocrit 39.0 - 52.0 %  46.5  Platelets 150 - 400 K/uL 134(L)     Assessment and plan- Patient is a 30 y.o. male with autoimmune pancytopenia here for routine follow-up.   Received 4 doses of Rituxan last in December 2021. Counts from today are slowly declining although stable. Recommend labs in 1 month. WBC and platelet count have fluctuated some over the past month although relatively stable. No immediate need for Rituxan at this time.   Disposition- Labs monthly.  RTC in 3 months for lab and to see d\Dr. Janese Banks.    Visit Diagnosis 1. Autoimmune pancytopenia ([place])    Faythe Casa, NP 02/24/2021 9:22 PM

## 2021-03-18 ENCOUNTER — Other Ambulatory Visit: Payer: Self-pay | Admitting: *Deleted

## 2021-03-18 DIAGNOSIS — D61818 Other pancytopenia: Secondary | ICD-10-CM

## 2021-03-24 ENCOUNTER — Other Ambulatory Visit: Payer: Self-pay

## 2021-03-24 ENCOUNTER — Inpatient Hospital Stay: Payer: Medicare Other | Attending: Oncology

## 2021-03-24 DIAGNOSIS — D61818 Other pancytopenia: Secondary | ICD-10-CM | POA: Diagnosis not present

## 2021-03-24 LAB — CBC WITH DIFFERENTIAL/PLATELET
Abs Immature Granulocytes: 0 10*3/uL (ref 0.00–0.07)
Basophils Absolute: 0 10*3/uL (ref 0.0–0.1)
Basophils Relative: 1 %
Eosinophils Absolute: 0 10*3/uL (ref 0.0–0.5)
Eosinophils Relative: 1 %
HCT: 46.4 % (ref 39.0–52.0)
Hemoglobin: 15.5 g/dL (ref 13.0–17.0)
Immature Granulocytes: 0 %
Lymphocytes Relative: 36 %
Lymphs Abs: 1.3 10*3/uL (ref 0.7–4.0)
MCH: 29.1 pg (ref 26.0–34.0)
MCHC: 33.4 g/dL (ref 30.0–36.0)
MCV: 87.2 fL (ref 80.0–100.0)
Monocytes Absolute: 0.3 10*3/uL (ref 0.1–1.0)
Monocytes Relative: 9 %
Neutro Abs: 2.1 10*3/uL (ref 1.7–7.7)
Neutrophils Relative %: 53 %
Platelets: 127 10*3/uL — ABNORMAL LOW (ref 150–400)
RBC: 5.32 MIL/uL (ref 4.22–5.81)
RDW: 12.4 % (ref 11.5–15.5)
WBC: 3.8 10*3/uL — ABNORMAL LOW (ref 4.0–10.5)
nRBC: 0 % (ref 0.0–0.2)

## 2021-05-26 ENCOUNTER — Other Ambulatory Visit: Payer: Self-pay

## 2021-05-26 ENCOUNTER — Inpatient Hospital Stay: Payer: Medicare Other | Attending: Oncology

## 2021-05-26 ENCOUNTER — Inpatient Hospital Stay (HOSPITAL_BASED_OUTPATIENT_CLINIC_OR_DEPARTMENT_OTHER): Payer: Medicare Other | Admitting: Oncology

## 2021-05-26 ENCOUNTER — Encounter: Payer: Self-pay | Admitting: Oncology

## 2021-05-26 VITALS — BP 118/83 | HR 57 | Temp 96.4°F | Resp 18 | Wt 177.7 lb

## 2021-05-26 DIAGNOSIS — F79 Unspecified intellectual disabilities: Secondary | ICD-10-CM | POA: Diagnosis not present

## 2021-05-26 DIAGNOSIS — F840 Autistic disorder: Secondary | ICD-10-CM | POA: Insufficient documentation

## 2021-05-26 DIAGNOSIS — Z9221 Personal history of antineoplastic chemotherapy: Secondary | ICD-10-CM | POA: Insufficient documentation

## 2021-05-26 DIAGNOSIS — D61818 Other pancytopenia: Secondary | ICD-10-CM | POA: Insufficient documentation

## 2021-05-26 LAB — CBC WITH DIFFERENTIAL/PLATELET
Abs Immature Granulocytes: 0.01 10*3/uL (ref 0.00–0.07)
Basophils Absolute: 0 10*3/uL (ref 0.0–0.1)
Basophils Relative: 1 %
Eosinophils Absolute: 0.1 10*3/uL (ref 0.0–0.5)
Eosinophils Relative: 2 %
HCT: 44.1 % (ref 39.0–52.0)
Hemoglobin: 14.8 g/dL (ref 13.0–17.0)
Immature Granulocytes: 0 %
Lymphocytes Relative: 33 %
Lymphs Abs: 1.3 10*3/uL (ref 0.7–4.0)
MCH: 29 pg (ref 26.0–34.0)
MCHC: 33.6 g/dL (ref 30.0–36.0)
MCV: 86.3 fL (ref 80.0–100.0)
Monocytes Absolute: 0.4 10*3/uL (ref 0.1–1.0)
Monocytes Relative: 10 %
Neutro Abs: 2.2 10*3/uL (ref 1.7–7.7)
Neutrophils Relative %: 54 %
Platelets: 97 10*3/uL — ABNORMAL LOW (ref 150–400)
RBC: 5.11 MIL/uL (ref 4.22–5.81)
RDW: 12.7 % (ref 11.5–15.5)
WBC: 4 10*3/uL (ref 4.0–10.5)
nRBC: 0 % (ref 0.0–0.2)

## 2021-05-26 NOTE — Progress Notes (Signed)
? ? ? ?Hematology/Oncology Consult note ?[place]  ?[phone] [phone] ? ?Patient Care Team: ?[name], MD as PCP - General (Internal Medicine) ?Sindy Guadeloupe, MD as Consulting Physician (Hematology and Oncology)  ? ?Name of the patient: Bobby Dean  ?[id number]  ?08/25/1990  ? ?Date of visit: 05/26/21 ? ?Diagnosis- autoimmune neutropenia and thrombocytopenia ? ?Chief complaint/ Reason for visit-routine follow-up of autoimmune pancytopenia ? ?Heme/Onc history: Patient is a 31 year old male was then referred to [place] for evaluation and management of thrombocytopenia. He has a history of autism and intellectual visibility and currently lives in a group home for the last 7 months. His medical decision maker is [name] from [place] office. ?  ?Blood work from 04/28/2016 showed white count of 2.1 with an H&H of 15.5/45.1 and a platelet count of 51. CMP was within normal limits. TSH was normal at 1.09. At this time I do not have any prior CBCs for comparison. ?  ?Bone marrow biopsy on 05/14/2016 showed normal trilineage hematopoiesis with no evidence of lymphoma or leukemia. Peripheral flow cytometry showed reversal of CD4 to CD8 ratio. HIV hepatitis B and hepatitis C testing was negative. B12 and folate were within normal limits. PT/PTT INR was within normal limits. ?  ?Review of peripheral smear showed leukopenia and moderate neutropenia. Thrombocytopenia. RBC morphology is normal. Scattered reactive appearing lymphocytes are seen. No blasts or early forms noted. ?  ?Monospot testing was positive. ANA was negative. Quantitative immunoglobulins were within normal limits. Serum copper was within normal limits. CMV DNA PCR negative ?  ?Patient was started on empiric high dose steroids for possible autoimmune etiology on 05/28/16. His counts normalized. Then his steroid taper was started. His WBC remained normal but his platelets began to decline 60's. ?   ?Plan was to restart full dose steroids and switch to rituxan second line after receiving pre splenectomy vaccines in anticipation of splenectomy should he not respond to rituxan ?  ?Patient has received all his pre splenectomy vaccines. He started rituxan on 10/14/16. Seen at [facility] for second opinion by [name] who agrees with the plan. ?  ?Recurrent neutropenia/ TCP noted in sept 2019. rituxan and steroids restarted on 12/15/17 and counts normalized.  Patient has been requiring Rituxan roughly every 8 to 12 months ? ? ?Interval history-presents today with his group home leader. He is doing well. No recent infections.  He is hoping to participate in the [place] in May. ? ?ECOG PS- 0 ?Pain scale- 0 ? ? ?Review of systems- Review of Systems  ?Constitutional: Negative.  Negative for chills, fever, malaise/fatigue and weight loss.  ?HENT:  Negative for congestion, ear pain and tinnitus.   ?Eyes: Negative.  Negative for blurred vision and double vision.  ?Respiratory: Negative.  Negative for cough, sputum production and shortness of breath.   ?Cardiovascular: Negative.  Negative for chest pain, palpitations and leg swelling.  ?Gastrointestinal: Negative.  Negative for abdominal pain, constipation, diarrhea, nausea and vomiting.  ?Genitourinary:  Negative for dysuria, frequency and urgency.  ?Musculoskeletal:  Negative for back pain and falls.  ?Skin: Negative.  Negative for rash.  ?Neurological: Negative.  Negative for weakness and headaches.  ?Endo/Heme/Allergies: Negative.  Does not bruise/bleed easily.  ?Psychiatric/Behavioral: Negative.  Negative for depression. The patient is not nervous/anxious and does not have insomnia.    ? ?No Known Allergies ? ? ?Past Medical History:  ?Diagnosis Date  ? Anxiety   ? Autism   ? Autism   ?  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 ? ? ?History reviewed. No pertinent surgical history. ? ?Social History  ? ?Socioeconomic History  ? Marital status: Single  ?  Spouse name: Not on  file  ? Number of children: Not on file  ? Years of education: Not on file  ? Highest education level: Not on file  ?Occupational History  ? Not on file  ?Tobacco Use  ? Smoking status: Never  ? Smokeless tobacco: Never  ?Vaping Use  ? Vaping Use: Never used  ?Substance and Sexual Activity  ? Alcohol use: No  ? Drug use: No  ? Sexual activity: Not Currently  ?Other Topics Concern  ? Not on file  ?Social History Narrative  ? Not on file  ? ?Social Determinants of Health  ? ?Financial Resource Strain: Low Risk   ? Difficulty of Paying Living Expenses: Not hard at all  ?Food Insecurity: No Food Insecurity  ? Worried About Charity fundraiser in the Last Year: Never true  ? Ran Out of Food in the Last Year: Never true  ?Transportation Needs: No Transportation Needs  ? Lack of Transportation (Medical): No  ? Lack of Transportation (Non-Medical): No  ?Physical Activity: Sufficiently Active  ? Days of Exercise per Week: 4 days  ? Minutes of Exercise per Session: 40 min  ?Stress: No Stress Concern Present  ? Feeling of Stress : Not at all  ?Social Connections: Moderately Isolated  ? Frequency of Communication with Friends and Family: More than three times a week  ? Frequency of Social Gatherings with Friends and Family: More than three times a week  ? Attends Religious Services: Never  ? Active Member of Clubs or Organizations: Yes  ? Attends Archivist Meetings: More than 4 times per year  ? Marital Status: Never married  ?Intimate Partner Violence: Not At Risk  ? Fear of Current or Ex-Partner: No  ? Emotionally Abused: No  ? Physically Abused: No  ? Sexually Abused: No  ? ? ?Family History  ?Problem Relation Age of Onset  ? Cancer Mother   ? ? ?No current outpatient medications on file. ? ?Physical exam:  ?Vitals:  ? 05/26/21 0959  ?BP: 118/83  ?Pulse: (!) 57  ?Resp: 18  ?Temp: (!) 96.4 ?F (35.8 ?C)  ?SpO2: 99%  ?Weight: 177 lb 11.2 oz (80.6 kg)  ? ?Physical Exam ?Constitutional:   ?   Appearance: Normal  appearance.  ?HENT:  ?   Head: Normocephalic and atraumatic.  ?Eyes:  ?   Pupils: Pupils are equal, round, and reactive to light.  ?Cardiovascular:  ?   Rate and Rhythm: Normal rate and regular rhythm.  ?   Heart sounds: Normal heart sounds. No murmur heard. ?Pulmonary:  ?   Effort: Pulmonary effort is normal.  ?   Breath sounds: Normal breath sounds. No wheezing.  ?Abdominal:  ?   General: Bowel sounds are normal. There is no distension.  ?   Palpations: Abdomen is soft.  ?   Tenderness: There is no abdominal tenderness.  ?Musculoskeletal:     ?   General: Normal range of motion.  ?   Cervical back: Normal range of motion.  ?Skin: ?   General: Skin is warm and dry.  ?   Findings: No rash.  ?Neurological:  ?   Mental Status: He is alert and oriented to person, place, and time.  ?Psychiatric:     ?   Judgment: Judgment normal.  ?  ? ?  CMP Latest Ref Rng & Units 01/29/2021  ?Glucose 65 - 99 mg/dL 81  ?BUN 7 - 25 mg/dL 9  ?Creatinine 0.60 - 1.24 mg/dL 0.70  ?Sodium 135 - 146 mmol/L 140  ?Potassium 3.5 - 5.3 mmol/L 4.0  ?Chloride 98 - 110 mmol/L 102  ?CO2 20 - 32 mmol/L 27  ?Calcium 8.6 - 10.3 mg/dL 9.2  ?Total Protein 6.1 - 8.1 g/dL 6.6  ?Total Bilirubin 0.2 - 1.2 mg/dL 0.9  ?Alkaline Phos 38 - 126 U/L -  ?AST 10 - 40 U/L 15  ?ALT 9 - 46 U/L 11  ? ?CBC Latest Ref Rng & Units 05/26/2021  ?WBC 4.0 - 10.5 K/uL 4.0  ?Hemoglobin 13.0 - 17.0 g/dL 14.8  ?Hematocrit 39.0 - 52.0 % 44.1  ?Platelets 150 - 400 K/uL 97(L)  ? ? ? ?Assessment and plan- Patient is a 31 y.o. male with autoimmune pancytopenia here for routine follow-up.  ? ?He is status post 4 doses of Rituxan last in December 2021.  Counts continue to fluctuate although they remain stable.  Recommend follow-up in 3 months with labs and MD assessment. ? ?Disposition- ?RTC in 3 months for lab work and to see Dr. Janese Banks. ? ?I spent 25 minutes dedicated to the care of this patient (face-to-face and non-face-to-face) on the date of the encounter to include what is described in  the assessment and plan. ?  ?Visit Diagnosis ?1. 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 ? ?Faythe Casa, NP ?05/26/2021 10:19 AM ? ? ? ? ? ?    ? ? ? ? ? ?

## 2021-05-26 NOTE — Progress Notes (Signed)
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 Also, a copy of his labs.  ?

## 2021-05-27 ENCOUNTER — Telehealth: Payer: Self-pay | Admitting: *Deleted

## 2021-05-27 NOTE — Telephone Encounter (Signed)
Bobby Dean wants to know if it is ok with Sharia Reeve to go to the special olympics. It is in May and Bobby Dean thinks it is May 5. Wants to know if pt will be able to participate. His last count 3/14 and count 97 platelet. He does not get another lab until June. ?

## 2021-05-29 ENCOUNTER — Encounter: Payer: Self-pay | Admitting: Oncology

## 2021-06-17 ENCOUNTER — Other Ambulatory Visit: Payer: Self-pay | Admitting: *Deleted

## 2021-06-17 ENCOUNTER — Telehealth: Payer: Self-pay | Admitting: *Deleted

## 2021-06-17 DIAGNOSIS — D61818 Other pancytopenia: Secondary | ICD-10-CM

## 2021-06-17 NOTE — Telephone Encounter (Signed)
Called wendy and she is ok to bring pt 5/1 for labs to make sure his labs are good to participate in the olympics. Appt at 11. All is good ?

## 2021-06-24 ENCOUNTER — Ambulatory Visit: Payer: Medicare Other | Admitting: Internal Medicine

## 2021-07-13 ENCOUNTER — Inpatient Hospital Stay: Payer: Medicare Other | Attending: Oncology

## 2021-07-13 DIAGNOSIS — D61818 Other pancytopenia: Secondary | ICD-10-CM | POA: Diagnosis not present

## 2021-07-13 LAB — CBC WITH DIFFERENTIAL/PLATELET
Abs Immature Granulocytes: 0.05 10*3/uL (ref 0.00–0.07)
Basophils Absolute: 0 10*3/uL (ref 0.0–0.1)
Basophils Relative: 1 %
Eosinophils Absolute: 0.1 10*3/uL (ref 0.0–0.5)
Eosinophils Relative: 1 %
HCT: 45.2 % (ref 39.0–52.0)
Hemoglobin: 15.2 g/dL (ref 13.0–17.0)
Immature Granulocytes: 1 %
Lymphocytes Relative: 39 %
Lymphs Abs: 1.7 10*3/uL (ref 0.7–4.0)
MCH: 29.3 pg (ref 26.0–34.0)
MCHC: 33.6 g/dL (ref 30.0–36.0)
MCV: 87.3 fL (ref 80.0–100.0)
Monocytes Absolute: 0.4 10*3/uL (ref 0.1–1.0)
Monocytes Relative: 10 %
Neutro Abs: 2 10*3/uL (ref 1.7–7.7)
Neutrophils Relative %: 48 %
Platelets: 48 10*3/uL — ABNORMAL LOW (ref 150–400)
RBC: 5.18 MIL/uL (ref 4.22–5.81)
RDW: 12.9 % (ref 11.5–15.5)
WBC: 4.3 10*3/uL (ref 4.0–10.5)
nRBC: 0 % (ref 0.0–0.2)

## 2021-07-14 NOTE — Progress Notes (Signed)
Victorino Dike told wendy that pt can enjoy and participate in the special olympics ?

## 2021-07-15 ENCOUNTER — Ambulatory Visit (INDEPENDENT_AMBULATORY_CARE_PROVIDER_SITE_OTHER): Payer: Medicare Other | Admitting: Internal Medicine

## 2021-07-15 ENCOUNTER — Encounter: Payer: Self-pay | Admitting: Internal Medicine

## 2021-07-15 VITALS — BP 122/82 | HR 69 | Ht 75.0 in | Wt 176.6 lb

## 2021-07-15 DIAGNOSIS — D61818 Other pancytopenia: Secondary | ICD-10-CM

## 2021-07-15 DIAGNOSIS — F840 Autistic disorder: Secondary | ICD-10-CM

## 2021-07-15 DIAGNOSIS — F419 Anxiety disorder, unspecified: Secondary | ICD-10-CM | POA: Diagnosis not present

## 2021-07-15 NOTE — Progress Notes (Signed)
? ?Established Patient Office Visit ? ?Subjective:  ?Patient ID: Bobby Dean, male    DOB: 1990-05-24  Age: 31 y.o. MRN: [id number] ? ?CC:  ?Chief Complaint  ?Patient presents with  ? Follow-up  ? ? ?HPI ? ?Bobby Dean presents for general checkup.  Patient has a history of thrombocytopenia the last platelet count was 48,000.  Patient is going to see the hematologist.  He does not have any rash or bruises.  No evidence of any bleeding. ? ?Past Medical History:  ?Diagnosis Date  ? Anxiety   ? Autism   ? Autism   ? 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 ? ?No past surgical history on file. ? ?Family History  ?Problem Relation Age of Onset  ? Cancer Mother   ? ? ?Social History  ? ?Socioeconomic History  ? Marital status: Single  ?  Spouse name: Not on file  ? Number of children: Not on file  ? Years of education: Not on file  ? Highest education level: Not on file  ?Occupational History  ? Not on file  ?Tobacco Use  ? Smoking status: Never  ? Smokeless tobacco: Never  ?Vaping Use  ? Vaping Use: Never used  ?Substance and Sexual Activity  ? Alcohol use: No  ? Drug use: No  ? Sexual activity: Not Currently  ?Other Topics Concern  ? Not on file  ?Social History Narrative  ? Not on file  ? ?Social Determinants of Health  ? ?Financial Resource Strain: Low Risk   ? Difficulty of Paying Living Expenses: Not hard at all  ?Food Insecurity: No Food Insecurity  ? Worried About Charity fundraiser in the Last Year: Never true  ? Ran Out of Food in the Last Year: Never true  ?Transportation Needs: No Transportation Needs  ? Lack of Transportation (Medical): No  ? Lack of Transportation (Non-Medical): No  ?Physical Activity: Sufficiently Active  ? Days of Exercise per Week: 4 days  ? Minutes of Exercise per Session: 40 min  ?Stress: No Stress Concern Present  ? Feeling of Stress : Not at all  ?Social Connections: Moderately Isolated  ? Frequency of Communication with Friends and Family: More than three times a week  ? Frequency  of Social Gatherings with Friends and Family: More than three times a week  ? Attends Religious Services: Never  ? Active Member of Clubs or Organizations: Yes  ? Attends Archivist Meetings: More than 4 times per year  ? Marital Status: Never married  ?Intimate Partner Violence: Not At Risk  ? Fear of Current or Ex-Partner: No  ? Emotionally Abused: No  ? Physically Abused: No  ? Sexually Abused: No  ? ? ?No current outpatient medications on file.  ? ?No Known Allergies ? ?ROS ?Review of Systems  ?Constitutional: Negative.   ?HENT: Negative.    ?Eyes: Negative.   ?Respiratory: Negative.    ?Cardiovascular: Negative.   ?Gastrointestinal: Negative.   ?Endocrine: Negative.   ?Genitourinary: Negative.   ?Musculoskeletal: Negative.   ?Skin: Negative.   ?Allergic/Immunologic: Negative.   ?Neurological: Negative.   ?Hematological: Negative.   ?Psychiatric/Behavioral: Negative.    ?All other systems reviewed and are negative. ? ?  ?Objective:  ?  ?Physical Exam ?Vitals reviewed.  ?Constitutional:   ?   Appearance: Normal appearance.  ?HENT:  ?   Mouth/Throat:  ?   Mouth: Mucous membranes are moist.  ?Eyes:  ?   Pupils: Pupils are equal, round, and reactive to light.  ?  Neck:  ?   Vascular: No carotid bruit.  ?Cardiovascular:  ?   Rate and Rhythm: Normal rate and regular rhythm.  ?   Pulses: Normal pulses.  ?   Heart sounds: Normal heart sounds.  ?Pulmonary:  ?   Effort: Pulmonary effort is normal.  ?   Breath sounds: Normal breath sounds.  ?Abdominal:  ?   General: Bowel sounds are normal.  ?   Palpations: Abdomen is soft. There is no hepatomegaly, splenomegaly or mass.  ?   Tenderness: There is no abdominal tenderness.  ?   Hernia: No hernia is present.  ?Musculoskeletal:  ?   Cervical back: Neck supple.  ?   Right lower leg: No edema.  ?   Left lower leg: No edema.  ?Skin: ?   Findings: No rash.  ?Neurological:  ?   Mental Status: He is alert and oriented to person, place, and time.  ?   Motor: No weakness.   ?Psychiatric:     ?   Mood and Affect: Mood normal.     ?   Behavior: Behavior normal.  ? ? ?BP 122/82   Pulse 69   Ht $R'6\' 3"'iL$  (1.905 m)   Wt 176 lb 9.6 oz (80.1 kg)   BMI 22.07 kg/m?  ?Wt Readings from Last 3 Encounters:  ?07/15/21 176 lb 9.6 oz (80.1 kg)  ?05/26/21 177 lb 11.2 oz (80.6 kg)  ?02/24/21 176 lb 12.8 oz (80.2 kg)  ? ? ? ?Health Maintenance Due  ?Topic Date Due  ? COVID-19 Vaccine (1) 07/13/1993  ? ? ?There are no preventive care reminders to display for this patient. ? ?Lab Results  ?Component Value Date  ? TSH 0.58 01/29/2021  ? ?Lab Results  ?Component Value Date  ? WBC 4.3 07/13/2021  ? HGB 15.2 07/13/2021  ? HCT 45.2 07/13/2021  ? MCV 87.3 07/13/2021  ? PLT 48 (L) 07/13/2021  ? ?Lab Results  ?Component Value Date  ? NA 140 01/29/2021  ? K 4.0 01/29/2021  ? CO2 27 01/29/2021  ? GLUCOSE 81 01/29/2021  ? BUN 9 01/29/2021  ? CREATININE 0.70 01/29/2021  ? BILITOT 0.9 01/29/2021  ? ALKPHOS 39 04/08/2020  ? AST 15 01/29/2021  ? ALT 11 01/29/2021  ? PROT 6.6 01/29/2021  ? ALBUMIN 4.1 04/08/2020  ? CALCIUM 9.2 01/29/2021  ? ANIONGAP 6 04/08/2020  ? EGFR 129 01/29/2021  ? ?Lab Results  ?Component Value Date  ? CHOL 167 01/29/2021  ? ?Lab Results  ?Component Value Date  ? HDL 59 01/29/2021  ? ?Lab Results  ?Component Value Date  ? [place] 93 01/29/2021  ? ?Lab Results  ?Component Value Date  ? TRIG 60 01/29/2021  ? ?Lab Results  ?Component Value Date  ? CHOLHDL 2.8 01/29/2021  ? ?No results found for: HGBA1C ? ?  ?Assessment & Plan:  ? ?Problem List Items Addressed This Visit   ? ?  ? Hematopoietic and Hemostatic  ? 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  ? Other  ? Autism  ? Anxiety  ? ? ?No orders of the defined types were placed in this encounter. ? ? ?Follow-up: No follow-ups on file.  ? ? ?[name], MD ?

## 2021-08-20 ENCOUNTER — Telehealth: Payer: Self-pay | Admitting: *Deleted

## 2021-08-20 NOTE — Telephone Encounter (Signed)
Patient guardian called asking if he will be restarting Rituxan next week. I went over appts and possibility for Rituxan and his past several lab results which have been on a steady decline. She is going to write up letter to her supervisor to get approval for this infusion. Hours ewill be available for contact when decision is made to restart Rituxan

## 2021-08-21 NOTE — Telephone Encounter (Signed)
Social worker Rorie Ashwood called and Iasked if pt is going to get rituxan next week. I called her back and said that they have him on for possible rituxan. It would be decided after he has blood counts and see what platelets are. In my message I asked if she could call back and left my phone number.

## 2021-08-25 ENCOUNTER — Inpatient Hospital Stay (HOSPITAL_BASED_OUTPATIENT_CLINIC_OR_DEPARTMENT_OTHER): Payer: Medicare Other | Admitting: Oncology

## 2021-08-25 ENCOUNTER — Other Ambulatory Visit: Payer: Medicare Other

## 2021-08-25 ENCOUNTER — Other Ambulatory Visit: Payer: Self-pay

## 2021-08-25 ENCOUNTER — Inpatient Hospital Stay: Payer: Medicare Other | Attending: Oncology

## 2021-08-25 ENCOUNTER — Encounter: Payer: Self-pay | Admitting: Oncology

## 2021-08-25 ENCOUNTER — Inpatient Hospital Stay: Payer: Medicare Other

## 2021-08-25 ENCOUNTER — Telehealth: Payer: Self-pay | Admitting: *Deleted

## 2021-08-25 ENCOUNTER — Ambulatory Visit: Payer: Medicare Other | Admitting: Oncology

## 2021-08-25 VITALS — BP 132/89 | HR 64 | Temp 97.6°F | Resp 18 | Wt 176.2 lb

## 2021-08-25 VITALS — BP 141/87 | HR 115 | Temp 96.0°F | Resp 18

## 2021-08-25 DIAGNOSIS — D8989 Other specified disorders involving the immune mechanism, not elsewhere classified: Secondary | ICD-10-CM

## 2021-08-25 DIAGNOSIS — D61818 Other pancytopenia: Secondary | ICD-10-CM | POA: Insufficient documentation

## 2021-08-25 DIAGNOSIS — Z79620 Long term (current) use of immunosuppressive biologic: Secondary | ICD-10-CM

## 2021-08-25 DIAGNOSIS — D709 Neutropenia, unspecified: Secondary | ICD-10-CM | POA: Insufficient documentation

## 2021-08-25 DIAGNOSIS — Z5181 Encounter for therapeutic drug level monitoring: Secondary | ICD-10-CM | POA: Diagnosis not present

## 2021-08-25 DIAGNOSIS — D693 Immune thrombocytopenic purpura: Secondary | ICD-10-CM | POA: Diagnosis not present

## 2021-08-25 DIAGNOSIS — F840 Autistic disorder: Secondary | ICD-10-CM | POA: Insufficient documentation

## 2021-08-25 DIAGNOSIS — M359 Systemic involvement of connective tissue, unspecified: Secondary | ICD-10-CM | POA: Diagnosis not present

## 2021-08-25 DIAGNOSIS — D708 Other neutropenia: Secondary | ICD-10-CM | POA: Insufficient documentation

## 2021-08-25 DIAGNOSIS — D696 Thrombocytopenia, unspecified: Secondary | ICD-10-CM | POA: Insufficient documentation

## 2021-08-25 DIAGNOSIS — Z593 Problems related to living in residential institution: Secondary | ICD-10-CM | POA: Insufficient documentation

## 2021-08-25 LAB — CBC WITH DIFFERENTIAL/PLATELET
Abs Immature Granulocytes: 0.01 10*3/uL (ref 0.00–0.07)
Basophils Absolute: 0 10*3/uL (ref 0.0–0.1)
Basophils Relative: 1 %
Eosinophils Absolute: 0.1 10*3/uL (ref 0.0–0.5)
Eosinophils Relative: 3 %
HCT: 46.2 % (ref 39.0–52.0)
Hemoglobin: 15.8 g/dL (ref 13.0–17.0)
Immature Granulocytes: 0 %
Lymphocytes Relative: 37 %
Lymphs Abs: 1.7 10*3/uL (ref 0.7–4.0)
MCH: 29.4 pg (ref 26.0–34.0)
MCHC: 34.2 g/dL (ref 30.0–36.0)
MCV: 85.9 fL (ref 80.0–100.0)
Monocytes Absolute: 0.5 10*3/uL (ref 0.1–1.0)
Monocytes Relative: 10 %
Neutro Abs: 2.3 10*3/uL (ref 1.7–7.7)
Neutrophils Relative %: 49 %
Platelets: 30 10*3/uL — ABNORMAL LOW (ref 150–400)
RBC: 5.38 MIL/uL (ref 4.22–5.81)
RDW: 12.6 % (ref 11.5–15.5)
WBC: 4.7 10*3/uL (ref 4.0–10.5)
nRBC: 0 % (ref 0.0–0.2)

## 2021-08-25 MED ORDER — ACETAMINOPHEN 325 MG PO TABS
650.0000 mg | ORAL_TABLET | Freq: Once | ORAL | Status: AC
  Administered 2021-08-25: 650 mg via ORAL
  Filled 2021-08-25: qty 2

## 2021-08-25 MED ORDER — SODIUM CHLORIDE 0.9 % IV SOLN: 375.0000 mg/m2 | Freq: Once | INTRAVENOUS | Status: DC

## 2021-08-25 MED ORDER — SODIUM CHLORIDE 0.9 % IV SOLN
Freq: Once | INTRAVENOUS | Status: AC
  Filled 2021-08-25: qty 250

## 2021-08-25 MED ORDER — SULFAMETHOXAZOLE-TRIMETHOPRIM 800-160 MG PO TABS: 1.0000 | ORAL_TABLET | ORAL | 0 refills | Status: DC

## 2021-08-25 MED ORDER — SODIUM CHLORIDE 0.9 % IV SOLN
375.0000 mg/m2 | Freq: Once | INTRAVENOUS | Status: AC
  Administered 2021-08-25: 800 mg via INTRAVENOUS
  Filled 2021-08-25: qty 50

## 2021-08-25 MED ORDER — DIPHENHYDRAMINE HCL 25 MG PO CAPS
50.0000 mg | ORAL_CAPSULE | Freq: Once | ORAL | Status: AC
  Administered 2021-08-25: 50 mg via ORAL
  Filled 2021-08-25: qty 2

## 2021-08-25 MED ORDER — PREDNISONE 50 MG PO TABS: ORAL_TABLET | ORAL | 0 refills | Status: DC

## 2021-08-25 NOTE — Patient Instructions (Addendum)
[facility]  Discharge Instructions: Thank you for choosing [facility] to provide your oncology and hematology care.  If you have a lab appointment with the Cancer Center, please go directly to the Cancer Center and check in at the registration area.  Wear comfortable clothing and clothing appropriate for easy access to any Portacath or PICC line.   We strive to give you quality time with your provider. You may need to reschedule your appointment if you arrive late (15 or more minutes).  Arriving late affects you and other patients whose appointments are after yours.  Also, if you miss three or more appointments without notifying the office, you may be dismissed from the clinic at the provider's discretion.      For prescription refill requests, have your pharmacy contact our office and allow 72 hours for refills to be completed.    Today you received the following chemotherapy and/or immunotherapy agents- Rituximab      To help prevent nausea and vomiting after your treatment, we encourage you to take your nausea medication as directed.  BELOW ARE SYMPTOMS THAT SHOULD BE REPORTED IMMEDIATELY: *FEVER GREATER THAN 100.4 F (38 C) OR HIGHER *CHILLS OR SWEATING *NAUSEA AND VOMITING THAT IS NOT CONTROLLED WITH YOUR NAUSEA MEDICATION *UNUSUAL SHORTNESS OF BREATH *UNUSUAL BRUISING OR BLEEDING *URINARY PROBLEMS (pain or burning when urinating, or frequent urination) *BOWEL PROBLEMS (unusual diarrhea, constipation, pain near the anus) TENDERNESS IN MOUTH AND THROAT WITH OR WITHOUT PRESENCE OF ULCERS (sore throat, sores in mouth, or a toothache) UNUSUAL RASH, SWELLING OR PAIN  UNUSUAL VAGINAL DISCHARGE OR ITCHING   Items with * indicate a potential emergency and should be followed up as soon as possible or go to the Emergency Department if any problems should occur.  Please show the CHEMOTHERAPY ALERT CARD or IMMUNOTHERAPY ALERT CARD at check-in to  the Emergency Department and triage nurse.  Should you have questions after your visit or need to cancel or reschedule your appointment, please contact [facility]  [phone] and follow the prompts.  Office hours are 8:00 a.m. to 4:30 p.m. Monday - Friday. Please note that voicemails left after 4:00 p.m. may not be returned until the following business day.  We are closed weekends and major holidays. You have access to a nurse at all times for urgent questions. Please call the main number to the clinic [phone] and follow the prompts.  For any non-urgent questions, you may also contact your provider using MyChart. We now offer e-Visits for anyone 18 and older to request care online for non-urgent symptoms. For details visit [email or web address].   Also download the MyChart app! Go to the app store, search "MyChart", open the app, select [facility], and log in with your MyChart username and password.  Masks are optional in the cancer centers. If you would like for your care team to wear a mask while they are taking care of you, please let them know. For doctor visits, patients may have with them one support person who is at least 29 years old. At this time, visitors are not allowed in the infusion area.   

## 2021-08-25 NOTE — Progress Notes (Signed)
Pt requests that we stop taking blood pressures as frequently. Per Dr. Smith Robert okay to continue to titrate Rituximab without taking B/P every 30 minutes. Per Dr. Smith Robert only take B/P if pt becomes symptomatic of a reaction and at end of infusion.    1540: Pt appears anxious at this time. Pt insists on cutting off RItuximab and being done with treatment for the day HR 115. IV flushed with NS, Approx 5cc of Rituximab wasted and Dr. Smith Robert aware.  Pt stable and no s/s of distress noted. OKay to discharge per Dr. Smith Robert. Pt discharged with caregiver, Carney Bern.

## 2021-08-25 NOTE — Telephone Encounter (Signed)
Dr Smith Robert and myself was in the room with patient and the staff from [facility]. Social worker Rorie Ashwood was on the phone and dr Smith Robert wanted her to have the treatment rituxan today if possible due to plt. Count of 30. She had to call someone else to get the approval and it is good for the next 4 weeks because he gets the rituxan weekly x 4. Going forward that if the labs start getting really low and the possibility that he may need the treatment again then Rorie would like to give her the information that what the plt number is and when there may be a need to give rituxan.

## 2021-08-25 NOTE — Progress Notes (Signed)
Hematology/Oncology Consult note [facility]  Telephone:([phone] [phone]  Patient Care Team: Corky Downs, MD as PCP - General (Internal Medicine) Creig Hines, MD as Consulting Physician (Hematology and Oncology)   Name of the patient: Bobby Dean  [id number]  01-19-1991   Date of visit: 08/25/21  Diagnosis- autoimmune neutropenia and thrombocytopenia  Chief complaint/ Reason for visit-on treatment assessment prior to cycle 1 of Rituxan  Heme/Onc history: Patient is a 31 year old male was then referred to [place] for evaluation and management of thrombocytopenia. He has a history of autism and intellectual visibility and currently lives in a group home for the last 7 months. His medical decision maker is Bobby Dean from [place] [facility] office.   Blood work from 04/28/2016 showed white count of 2.1 with an H&H of 15.5/45.1 and a platelet count of 51. CMP was within normal limits. TSH was normal at 1.09. At this time I do not have any prior CBCs for comparison.   Bone marrow biopsy on 05/14/2016 showed normal trilineage hematopoiesis with no evidence of lymphoma or leukemia. Peripheral flow cytometry showed reversal of CD4 to CD8 ratio. HIV hepatitis B and hepatitis C testing was negative. B12 and folate were within normal limits. PT/PTT INR was within normal limits.   Review of peripheral smear showed leukopenia and moderate neutropenia. Thrombocytopenia. RBC morphology is normal. Scattered reactive appearing lymphocytes are seen. No blasts or early forms noted.   Monospot testing was positive. ANA was negative. Quantitative immunoglobulins were within normal limits. Serum copper was within normal limits. CMV DNA PCR negative   Patient was started on empiric high dose steroids for possible autoimmune etiology on 05/28/16. His counts normalized. Then his steroid taper was started. His WBC remained normal but his platelets began to decline  60's.   Plan was to restart full dose steroids and switch to rituxan second line after receiving pre splenectomy vaccines in anticipation of splenectomy should he not respond to rituxan   Patient has received all his pre splenectomy vaccines. He started rituxan on 10/14/16. Seen at [facility] for second opinion by Bobby Dean who agrees with the plan.   Recurrent neutropenia/ TCP noted in sept 2019. rituxan and steroids restarted on 12/15/17 and counts normalized.  Patient has been requiring Rituxan roughly every 8 to 12 months  Interval history-patient is doing well presently and denies any specific complaints at this time  ECOG PS- 0 Pain scale- 0   Review of systems- Review of Systems  Constitutional:  Negative for chills, fever, malaise/fatigue and weight loss.  HENT:  Negative for congestion, ear discharge and nosebleeds.   Eyes:  Negative for blurred vision.  Respiratory:  Negative for cough, hemoptysis, sputum production, shortness of breath and wheezing.   Cardiovascular:  Negative for chest pain, palpitations, orthopnea and claudication.  Gastrointestinal:  Negative for abdominal pain, blood in stool, constipation, diarrhea, heartburn, melena, nausea and vomiting.  Genitourinary:  Negative for dysuria, flank pain, frequency, hematuria and urgency.  Musculoskeletal:  Negative for back pain, joint pain and myalgias.  Skin:  Negative for rash.  Neurological:  Negative for dizziness, tingling, focal weakness, seizures, weakness and headaches.  Endo/Heme/Allergies:  Does not bruise/bleed easily.  Psychiatric/Behavioral:  Negative for depression and suicidal ideas. The patient does not have insomnia.        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 No pertinent surgical history.  Social History   Socioeconomic History   Marital status: Single    Spouse name: Not on file    Number of children: Not on file   Years of education: Not on file   Highest education level: Not on file  Occupational History   Not on file  Tobacco Use   Smoking status: Never   Smokeless tobacco: Never  Vaping Use   Vaping Use: Never used  Substance and Sexual Activity   Alcohol use: No   Drug use: No   Sexual activity: Not Currently  Other Topics Concern   Not on file  Social History Narrative   Not on file   Social Determinants of Health   Financial Resource Strain: Low Risk  (01/25/2021)   Overall Financial Resource Strain (CARDIA)    Difficulty of Paying Living Expenses: Not hard at all  Food Insecurity: No Food Insecurity (01/25/2021)   Hunger Vital Sign    Worried About Running Out of Food in the Last Year: Never true    Ran Out of Food in the Last Year: Never true  Transportation Needs: No Transportation Needs (01/25/2021)   PRAPARE - Hydrologist (Medical): No    Lack of Transportation (Non-Medical): No  Physical Activity: Sufficiently Active (01/25/2021)   Exercise Vital Sign    Days of Exercise per Week: 4 days    Minutes of Exercise per Session: 40 min  Stress: No Stress Concern Present (01/25/2021)   [place]    Feeling of Stress : Not at all  Social Connections: Moderately Isolated (01/25/2021)   Social Connection and Isolation Panel [NHANES]    Frequency of Communication with Friends and Family: More than three times a week    Frequency of Social Gatherings with Friends and Family: More than three times a week    Attends Religious Services: Never    Marine scientist or Organizations: Yes    Attends Music therapist: More than 4 times per year    Marital Status: Never married  Intimate Partner Violence: Not At Risk (01/25/2021)   Humiliation, Afraid, Rape, and Kick questionnaire    Fear of Current or Ex-Partner: No    Emotionally Abused:  No    Physically Abused: No    Sexually Abused: No    Family History  Problem Relation Age of Onset   Cancer Mother      Current Outpatient Medications:    predniSONE (DELTASONE) 50 MG tablet, Take 1 tablet daily with food for 3 weeks, Disp: 21 tablet, Rfl: 0   [START ON 08/26/2021] sulfamethoxazole-trimethoprim (BACTRIM DS) 800-160 MG tablet, Take 1 tablet by mouth 3 (three) times a week., Disp: 9 tablet, Rfl: 0  Physical exam:  Vitals:   08/25/21 0947  BP: 132/89  Pulse: 64  Resp: 18  Temp: 97.6 F (36.4 C)  SpO2: 100%  Weight: 176 lb 3.2 oz (79.9 kg)   Physical Exam Constitutional:      General: He is not in acute distress. Cardiovascular:     Rate and Rhythm: Normal rate and regular rhythm.     Heart sounds: Normal heart sounds.  Pulmonary:     Effort: Pulmonary effort is normal.     Breath sounds: Normal breath sounds.  Skin:    General: Skin is warm and dry.  Neurological:     Mental  Status: He is alert and oriented to person, place, and time.         Latest Ref Rng & Units 01/29/2021    8:29 AM  CMP  Glucose 65 - 99 mg/dL 81   BUN 7 - 25 mg/dL 9   Creatinine 0.60 - 1.24 mg/dL 0.70   Sodium 135 - 146 mmol/L 140   Potassium 3.5 - 5.3 mmol/L 4.0   Chloride 98 - 110 mmol/L 102   CO2 20 - 32 mmol/L 27   Calcium 8.6 - 10.3 mg/dL 9.2   Total Protein 6.1 - 8.1 g/dL 6.6   Total Bilirubin 0.2 - 1.2 mg/dL 0.9   AST 10 - 40 U/L 15   ALT 9 - 46 U/L 11       Latest Ref Rng & Units 08/25/2021    9:04 AM  CBC  WBC 4.0 - 10.5 K/uL 4.7   Hemoglobin 13.0 - 17.0 g/dL 15.8   Hematocrit 39.0 - 52.0 % 46.2   Platelets 150 - 400 K/uL 30    Assessment and plan- Patient is a 31 y.o. male with history of autoimmune pancytopenia especially neutropenia and thrombocytopenia here for on treatment assessment prior to cycle 1 of Rituxan  Patient is roughly required Rituxan and steroid treatment for worsening thrombocytopenia about once a year.Presently his platelet counts  have drifted down to 30.  White count remains normal at 4.7 with a neutrophil count of 2.3.  Hemoglobin is normal.  He last received 4 doses of Rituxan back in December 2021.  He will now proceed with 4 more doses of Rituxan at this time with the first dose being today.  He will directly proceed for dose to the next week and I will see him back in 2 weeks for dose 3.  He has had hepatitis testing in the past but I am repeating hepatitis testing again since it has been more than a year.  However that would not affect him getting Rituxan today.  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  Patient will also be started on prednisone 50 mg daily and I will plan to taper it when I see him back in 2 weeks based on platelet response.  Patient will also be starting Bactrim 3 times a week for PCP prophylaxis.   Visit Diagnosis 1. 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 Encounter for monitoring rituximab therapy      [name], MD, MPH [facility] [phone] 08/25/2021 7:10 PM

## 2021-09-01 ENCOUNTER — Inpatient Hospital Stay: Payer: Medicare Other

## 2021-09-01 ENCOUNTER — Other Ambulatory Visit: Payer: Self-pay

## 2021-09-01 VITALS — BP 134/85 | HR 84 | Temp 96.6°F | Resp 16 | Wt 176.1 lb

## 2021-09-01 DIAGNOSIS — D693 Immune thrombocytopenic purpura: Secondary | ICD-10-CM | POA: Diagnosis not present

## 2021-09-01 DIAGNOSIS — D709 Neutropenia, unspecified: Secondary | ICD-10-CM | POA: Diagnosis not present

## 2021-09-01 DIAGNOSIS — D61818 Other pancytopenia: Secondary | ICD-10-CM | POA: Diagnosis not present

## 2021-09-01 DIAGNOSIS — F840 Autistic disorder: Secondary | ICD-10-CM | POA: Diagnosis not present

## 2021-09-01 DIAGNOSIS — M359 Systemic involvement of connective tissue, unspecified: Secondary | ICD-10-CM | POA: Diagnosis not present

## 2021-09-01 LAB — CBC WITH DIFFERENTIAL/PLATELET
Abs Immature Granulocytes: 0.05 10*3/uL (ref 0.00–0.07)
Basophils Absolute: 0 10*3/uL (ref 0.0–0.1)
Basophils Relative: 1 %
Eosinophils Absolute: 0 10*3/uL (ref 0.0–0.5)
Eosinophils Relative: 1 %
HCT: 48 % (ref 39.0–52.0)
Hemoglobin: 16.1 g/dL (ref 13.0–17.0)
Immature Granulocytes: 1 %
Lymphocytes Relative: 29 %
Lymphs Abs: 1.3 10*3/uL (ref 0.7–4.0)
MCH: 29 pg (ref 26.0–34.0)
MCHC: 33.5 g/dL (ref 30.0–36.0)
MCV: 86.3 fL (ref 80.0–100.0)
Monocytes Absolute: 0.5 10*3/uL (ref 0.1–1.0)
Monocytes Relative: 11 %
Neutro Abs: 2.7 10*3/uL (ref 1.7–7.7)
Neutrophils Relative %: 57 %
Platelets: 74 10*3/uL — ABNORMAL LOW (ref 150–400)
RBC: 5.56 MIL/uL (ref 4.22–5.81)
RDW: 12.2 % (ref 11.5–15.5)
WBC: 4.6 10*3/uL (ref 4.0–10.5)
nRBC: 0 % (ref 0.0–0.2)

## 2021-09-01 LAB — COMPREHENSIVE METABOLIC PANEL
ALT: 16 U/L (ref 0–44)
AST: 19 U/L (ref 15–41)
Albumin: 4.4 g/dL (ref 3.5–5.0)
Alkaline Phosphatase: 63 U/L (ref 38–126)
Anion gap: 10 (ref 5–15)
BUN: 10 mg/dL (ref 6–20)
CO2: 30 mmol/L (ref 22–32)
Calcium: 9.4 mg/dL (ref 8.9–10.3)
Chloride: 100 mmol/L (ref 98–111)
Creatinine, Ser: 0.76 mg/dL (ref 0.61–1.24)
GFR, Estimated: >60 mL/min (ref >60)
Glucose, Bld: 59 mg/dL — ABNORMAL LOW (ref 70–99)
Potassium: 3.5 mmol/L (ref 3.5–5.1)
Sodium: 140 mmol/L (ref 135–145)
Total Bilirubin: 0.9 mg/dL (ref 0.3–1.2)
Total Protein: 7.1 g/dL (ref 6.5–8.1)

## 2021-09-01 LAB — HEPATITIS B CORE ANTIBODY, TOTAL: Hep B Core Total Ab: NONREACTIVE

## 2021-09-01 LAB — HEPATITIS B SURFACE ANTIGEN: Hepatitis B Surface Ag: NONREACTIVE

## 2021-09-01 MED ORDER — SODIUM CHLORIDE 0.9 % IV SOLN
375.0000 mg/m2 | Freq: Once | INTRAVENOUS | Status: AC
  Administered 2021-09-01: 800 mg via INTRAVENOUS
  Filled 2021-09-01: qty 50

## 2021-09-01 MED ORDER — DIPHENHYDRAMINE HCL 25 MG PO CAPS
50.0000 mg | ORAL_CAPSULE | Freq: Once | ORAL | Status: AC
  Administered 2021-09-01: 50 mg via ORAL
  Filled 2021-09-01: qty 2

## 2021-09-01 MED ORDER — SODIUM CHLORIDE 0.9 % IV SOLN
Freq: Once | INTRAVENOUS | Status: AC
  Filled 2021-09-01: qty 250

## 2021-09-01 MED ORDER — ACETAMINOPHEN 325 MG PO TABS
650.0000 mg | ORAL_TABLET | Freq: Once | ORAL | Status: AC
  Administered 2021-09-01: 650 mg via ORAL
  Filled 2021-09-01: qty 2

## 2021-09-01 NOTE — Patient Instructions (Signed)
[facility] [place]  Discharge Instructions: Thank you for choosing [facility] to provide your oncology and hematology care.  If you have a lab appointment with the Cancer Center, please go directly to the Cancer Center and check in at the registration area.  Wear comfortable clothing and clothing appropriate for easy access to any Portacath or PICC line.   We strive to give you quality time with your provider. You may need to reschedule your appointment if you arrive late (15 or more minutes).  Arriving late affects you and other patients whose appointments are after yours.  Also, if you miss three or more appointments without notifying the office, you may be dismissed from the clinic at the provider's discretion.      For prescription refill requests, have your pharmacy contact our office and allow 72 hours for refills to be completed.    Today you received the following chemotherapy and/or immunotherapy agents- Rituximab      To help prevent nausea and vomiting after your treatment, we encourage you to take your nausea medication as directed.  BELOW ARE SYMPTOMS THAT SHOULD BE REPORTED IMMEDIATELY: *FEVER GREATER THAN 100.4 F (38 C) OR HIGHER *CHILLS OR SWEATING *NAUSEA AND VOMITING THAT IS NOT CONTROLLED WITH YOUR NAUSEA MEDICATION *UNUSUAL SHORTNESS OF BREATH *UNUSUAL BRUISING OR BLEEDING *URINARY PROBLEMS (pain or burning when urinating, or frequent urination) *BOWEL PROBLEMS (unusual diarrhea, constipation, pain near the anus) TENDERNESS IN MOUTH AND THROAT WITH OR WITHOUT PRESENCE OF ULCERS (sore throat, sores in mouth, or a toothache) UNUSUAL RASH, SWELLING OR PAIN  UNUSUAL VAGINAL DISCHARGE OR ITCHING   Items with * indicate a potential emergency and should be followed up as soon as possible or go to the Emergency Department if any problems should occur.  Please show the CHEMOTHERAPY ALERT CARD or IMMUNOTHERAPY ALERT CARD at check-in to  the Emergency Department and triage nurse.  Should you have questions after your visit or need to cancel or reschedule your appointment, please contact [facility] [place]  [phone] and follow the prompts.  Office hours are 8:00 a.m. to 4:30 p.m. Monday - Friday. Please note that voicemails left after 4:00 p.m. may not be returned until the following business day.  We are closed weekends and major holidays. You have access to a nurse at all times for urgent questions. Please call the main number to the clinic [phone] and follow the prompts.  For any non-urgent questions, you may also contact your provider using MyChart. We now offer e-Visits for anyone 18 and older to request care online for non-urgent symptoms. For details visit [email or web address].   Also download the MyChart app! Go to the app store, search "MyChart", open the app, select [place], and log in with your MyChart username and password.  Masks are optional in the cancer centers. If you would like for your care team to wear a mask while they are taking care of you, please let them know. For doctor visits, patients may have with them one support person who is at least 29 years old. At this time, visitors are not allowed in the infusion area.   

## 2021-09-01 NOTE — Progress Notes (Signed)
Glucose 59. Patient has been given soda, cookies, and peanut butter crackers. Patient denies any s/s at this time. Dr. Smith Robert made aware.

## 2021-09-07 ENCOUNTER — Other Ambulatory Visit: Payer: Self-pay | Admitting: Emergency Medicine

## 2021-09-07 DIAGNOSIS — D61818 Other pancytopenia: Secondary | ICD-10-CM

## 2021-09-08 ENCOUNTER — Inpatient Hospital Stay (HOSPITAL_BASED_OUTPATIENT_CLINIC_OR_DEPARTMENT_OTHER): Payer: Medicare Other | Admitting: Oncology

## 2021-09-08 ENCOUNTER — Inpatient Hospital Stay: Payer: Medicare Other

## 2021-09-08 ENCOUNTER — Encounter: Payer: Self-pay | Admitting: Oncology

## 2021-09-08 ENCOUNTER — Other Ambulatory Visit: Payer: Self-pay

## 2021-09-08 VITALS — BP 128/90 | HR 66 | Temp 97.2°F | Resp 18 | Wt 177.0 lb

## 2021-09-08 DIAGNOSIS — D61818 Other pancytopenia: Secondary | ICD-10-CM

## 2021-09-08 DIAGNOSIS — F840 Autistic disorder: Secondary | ICD-10-CM | POA: Diagnosis not present

## 2021-09-08 DIAGNOSIS — Z5181 Encounter for therapeutic drug level monitoring: Secondary | ICD-10-CM

## 2021-09-08 DIAGNOSIS — D693 Immune thrombocytopenic purpura: Secondary | ICD-10-CM | POA: Diagnosis not present

## 2021-09-08 DIAGNOSIS — D709 Neutropenia, unspecified: Secondary | ICD-10-CM | POA: Diagnosis not present

## 2021-09-08 DIAGNOSIS — M359 Systemic involvement of connective tissue, unspecified: Secondary | ICD-10-CM | POA: Diagnosis not present

## 2021-09-08 DIAGNOSIS — Z79620 Long term (current) use of immunosuppressive biologic: Secondary | ICD-10-CM | POA: Diagnosis not present

## 2021-09-08 LAB — CBC WITH DIFFERENTIAL/PLATELET
Abs Immature Granulocytes: 0.02 10*3/uL (ref 0.00–0.07)
Basophils Absolute: 0 10*3/uL (ref 0.0–0.1)
Basophils Relative: 0 %
Eosinophils Absolute: 0 10*3/uL (ref 0.0–0.5)
Eosinophils Relative: 0 %
HCT: 47 % (ref 39.0–52.0)
Hemoglobin: 15.8 g/dL (ref 13.0–17.0)
Immature Granulocytes: 0 %
Lymphocytes Relative: 15 %
Lymphs Abs: 1 10*3/uL (ref 0.7–4.0)
MCH: 29.1 pg (ref 26.0–34.0)
MCHC: 33.6 g/dL (ref 30.0–36.0)
MCV: 86.6 fL (ref 80.0–100.0)
Monocytes Absolute: 0.4 10*3/uL (ref 0.1–1.0)
Monocytes Relative: 6 %
Neutro Abs: 5.3 10*3/uL (ref 1.7–7.7)
Neutrophils Relative %: 79 %
Platelets: 88 10*3/uL — ABNORMAL LOW (ref 150–400)
RBC: 5.43 MIL/uL (ref 4.22–5.81)
RDW: 12.7 % (ref 11.5–15.5)
WBC: 6.7 10*3/uL (ref 4.0–10.5)
nRBC: 0 % (ref 0.0–0.2)

## 2021-09-08 LAB — COMPREHENSIVE METABOLIC PANEL
ALT: 16 U/L (ref 0–44)
AST: 15 U/L (ref 15–41)
Albumin: 4.1 g/dL (ref 3.5–5.0)
Alkaline Phosphatase: 57 U/L (ref 38–126)
Anion gap: 8 (ref 5–15)
BUN: 16 mg/dL (ref 6–20)
CO2: 31 mmol/L (ref 22–32)
Calcium: 9.3 mg/dL (ref 8.9–10.3)
Chloride: 98 mmol/L (ref 98–111)
Creatinine, Ser: 0.84 mg/dL (ref 0.61–1.24)
GFR, Estimated: >60 mL/min (ref >60)
Glucose, Bld: 88 mg/dL (ref 70–99)
Potassium: 4 mmol/L (ref 3.5–5.1)
Sodium: 137 mmol/L (ref 135–145)
Total Bilirubin: 0.9 mg/dL (ref 0.3–1.2)
Total Protein: 6.9 g/dL (ref 6.5–8.1)

## 2021-09-08 MED ORDER — HEPARIN SOD (PORK) LOCK FLUSH 100 UNIT/ML IV SOLN
500.0000 [IU] | Freq: Once | INTRAVENOUS | Status: DC | PRN
  Filled 2021-09-08: qty 5

## 2021-09-08 MED ORDER — SULFAMETHOXAZOLE-TRIMETHOPRIM 800-160 MG PO TABS: 1.0000 | ORAL_TABLET | ORAL | 0 refills | Status: DC

## 2021-09-08 MED ORDER — DIPHENHYDRAMINE HCL 25 MG PO CAPS
50.0000 mg | ORAL_CAPSULE | Freq: Once | ORAL | Status: AC
  Administered 2021-09-08: 50 mg via ORAL
  Filled 2021-09-08: qty 2

## 2021-09-08 MED ORDER — SODIUM CHLORIDE 0.9 % IV SOLN
Freq: Once | INTRAVENOUS | Status: AC
  Filled 2021-09-08: qty 250

## 2021-09-08 MED ORDER — ACETAMINOPHEN 325 MG PO TABS
650.0000 mg | ORAL_TABLET | Freq: Once | ORAL | Status: AC
  Administered 2021-09-08: 650 mg via ORAL
  Filled 2021-09-08: qty 2

## 2021-09-08 MED ORDER — SODIUM CHLORIDE 0.9 % IV SOLN
375.0000 mg/m2 | Freq: Once | INTRAVENOUS | Status: AC
  Administered 2021-09-08: 800 mg via INTRAVENOUS
  Filled 2021-09-08: qty 50

## 2021-09-08 MED ORDER — PREDNISONE 10 MG PO TABS: 10.0000 mg | ORAL_TABLET | ORAL | 1 refills | Status: DC

## 2021-09-16 ENCOUNTER — Inpatient Hospital Stay: Payer: Medicare Other

## 2021-09-16 ENCOUNTER — Inpatient Hospital Stay: Payer: Medicare Other | Attending: Oncology

## 2021-09-16 VITALS — BP 135/92 | HR 80 | Temp 98.5°F | Resp 17 | Wt 177.7 lb

## 2021-09-16 DIAGNOSIS — D61818 Other pancytopenia: Secondary | ICD-10-CM

## 2021-09-16 DIAGNOSIS — D693 Immune thrombocytopenic purpura: Secondary | ICD-10-CM | POA: Diagnosis not present

## 2021-09-16 DIAGNOSIS — Z5112 Encounter for antineoplastic immunotherapy: Secondary | ICD-10-CM | POA: Insufficient documentation

## 2021-09-16 LAB — CBC WITH DIFFERENTIAL/PLATELET
Abs Immature Granulocytes: 0.02 10*3/uL (ref 0.00–0.07)
Basophils Absolute: 0 10*3/uL (ref 0.0–0.1)
Basophils Relative: 0 %
Eosinophils Absolute: 0 10*3/uL (ref 0.0–0.5)
Eosinophils Relative: 0 %
HCT: 45.6 % (ref 39.0–52.0)
Hemoglobin: 15.4 g/dL (ref 13.0–17.0)
Immature Granulocytes: 0 %
Lymphocytes Relative: 16 %
Lymphs Abs: 1 10*3/uL (ref 0.7–4.0)
MCH: 29.6 pg (ref 26.0–34.0)
MCHC: 33.8 g/dL (ref 30.0–36.0)
MCV: 87.7 fL (ref 80.0–100.0)
Monocytes Absolute: 0.5 10*3/uL (ref 0.1–1.0)
Monocytes Relative: 7 %
Neutro Abs: 5 10*3/uL (ref 1.7–7.7)
Neutrophils Relative %: 77 %
Platelets: 107 10*3/uL — ABNORMAL LOW (ref 150–400)
RBC: 5.2 MIL/uL (ref 4.22–5.81)
RDW: 12.7 % (ref 11.5–15.5)
WBC: 6.6 10*3/uL (ref 4.0–10.5)
nRBC: 0 % (ref 0.0–0.2)

## 2021-09-16 LAB — COMPREHENSIVE METABOLIC PANEL
ALT: 13 U/L (ref 0–44)
AST: 16 U/L (ref 15–41)
Albumin: 3.9 g/dL (ref 3.5–5.0)
Alkaline Phosphatase: 47 U/L (ref 38–126)
Anion gap: 7 (ref 5–15)
BUN: 13 mg/dL (ref 6–20)
CO2: 28 mmol/L (ref 22–32)
Calcium: 8.8 mg/dL — ABNORMAL LOW (ref 8.9–10.3)
Chloride: 104 mmol/L (ref 98–111)
Creatinine, Ser: 0.65 mg/dL (ref 0.61–1.24)
GFR, Estimated: >60 mL/min (ref >60)
Glucose, Bld: 55 mg/dL — ABNORMAL LOW (ref 70–99)
Potassium: 3.2 mmol/L — ABNORMAL LOW (ref 3.5–5.1)
Sodium: 139 mmol/L (ref 135–145)
Total Bilirubin: 0.7 mg/dL (ref 0.3–1.2)
Total Protein: 6.6 g/dL (ref 6.5–8.1)

## 2021-09-16 MED ORDER — SODIUM CHLORIDE 0.9 % IV SOLN
375.0000 mg/m2 | Freq: Once | INTRAVENOUS | Status: AC
  Administered 2021-09-16: 800 mg via INTRAVENOUS
  Filled 2021-09-16: qty 50

## 2021-09-16 MED ORDER — ACETAMINOPHEN 325 MG PO TABS
650.0000 mg | ORAL_TABLET | Freq: Once | ORAL | Status: AC
  Administered 2021-09-16: 650 mg via ORAL
  Filled 2021-09-16: qty 2

## 2021-09-16 MED ORDER — DIPHENHYDRAMINE HCL 25 MG PO CAPS
50.0000 mg | ORAL_CAPSULE | Freq: Once | ORAL | Status: AC
  Administered 2021-09-16: 50 mg via ORAL
  Filled 2021-09-16: qty 2

## 2021-09-16 MED ORDER — SODIUM CHLORIDE 0.9 % IV SOLN
Freq: Once | INTRAVENOUS | Status: AC
  Filled 2021-09-16: qty 250

## 2021-09-16 NOTE — Patient Instructions (Signed)
[facility] [place]  Discharge Instructions: Thank you for choosing [facility] to provide your oncology and hematology care.  If you have a lab appointment with the Cancer Center, please go directly to the Cancer Center and check in at the registration area.  Wear comfortable clothing and clothing appropriate for easy access to any Portacath or PICC line.   We strive to give you quality time with your provider. You may need to reschedule your appointment if you arrive late (15 or more minutes).  Arriving late affects you and other patients whose appointments are after yours.  Also, if you miss three or more appointments without notifying the office, you may be dismissed from the clinic at the provider's discretion.      For prescription refill requests, have your pharmacy contact our office and allow 72 hours for refills to be completed.    Today you received the following chemotherapy and/or immunotherapy agents: RUXIENCE   To help prevent nausea and vomiting after your treatment, we encourage you to take your nausea medication as directed.  BELOW ARE SYMPTOMS THAT SHOULD BE REPORTED IMMEDIATELY: *FEVER GREATER THAN 100.4 F (38 C) OR HIGHER *CHILLS OR SWEATING *NAUSEA AND VOMITING THAT IS NOT CONTROLLED WITH YOUR NAUSEA MEDICATION *UNUSUAL SHORTNESS OF BREATH *UNUSUAL BRUISING OR BLEEDING *URINARY PROBLEMS (pain or burning when urinating, or frequent urination) *BOWEL PROBLEMS (unusual diarrhea, constipation, pain near the anus) TENDERNESS IN MOUTH AND THROAT WITH OR WITHOUT PRESENCE OF ULCERS (sore throat, sores in mouth, or a toothache) UNUSUAL RASH, SWELLING OR PAIN  UNUSUAL VAGINAL DISCHARGE OR ITCHING   Items with * indicate a potential emergency and should be followed up as soon as possible or go to the Emergency Department if any problems should occur.  Please show the CHEMOTHERAPY ALERT CARD or IMMUNOTHERAPY ALERT CARD at check-in to the  Emergency Department and triage nurse.  Should you have questions after your visit or need to cancel or reschedule your appointment, please contact [facility] [place]  [phone] and follow the prompts.  Office hours are 8:00 a.m. to 4:30 p.m. Monday - Friday. Please note that voicemails left after 4:00 p.m. may not be returned until the following business day.  We are closed weekends and major holidays. You have access to a nurse at all times for urgent questions. Please call the main number to the clinic [phone] and follow the prompts.  For any non-urgent questions, you may also contact your provider using MyChart. We now offer e-Visits for anyone 7 and older to request care online for non-urgent symptoms. For details visit [email or web address].   Also download the MyChart app! Go to the app store, search "MyChart", open the app, select [place], and log in with your MyChart username and password.  Masks are optional in the cancer centers. If you would like for your care team to wear a mask while they are taking care of you, please let them know. For doctor visits, patients may have with them one support person who is at least 29 years old. At this time, visitors are not allowed in the infusion area.

## 2021-09-23 ENCOUNTER — Other Ambulatory Visit: Payer: Medicare Other

## 2021-09-23 ENCOUNTER — Ambulatory Visit: Payer: Medicare Other

## 2021-09-28 ENCOUNTER — Other Ambulatory Visit: Payer: Self-pay | Admitting: *Deleted

## 2021-09-28 DIAGNOSIS — D61818 Other pancytopenia: Secondary | ICD-10-CM

## 2021-09-29 ENCOUNTER — Inpatient Hospital Stay: Payer: Medicare Other

## 2021-09-29 DIAGNOSIS — D61818 Other pancytopenia: Secondary | ICD-10-CM

## 2021-09-29 DIAGNOSIS — Z5112 Encounter for antineoplastic immunotherapy: Secondary | ICD-10-CM | POA: Diagnosis not present

## 2021-09-29 DIAGNOSIS — D693 Immune thrombocytopenic purpura: Secondary | ICD-10-CM | POA: Diagnosis not present

## 2021-09-29 LAB — CBC WITH DIFFERENTIAL/PLATELET
Abs Immature Granulocytes: 0.02 10*3/uL (ref 0.00–0.07)
Basophils Absolute: 0 10*3/uL (ref 0.0–0.1)
Basophils Relative: 0 %
Eosinophils Absolute: 0 10*3/uL (ref 0.0–0.5)
Eosinophils Relative: 0 %
HCT: 46.8 % (ref 39.0–52.0)
Hemoglobin: 15.7 g/dL (ref 13.0–17.0)
Immature Granulocytes: 0 %
Lymphocytes Relative: 10 %
Lymphs Abs: 0.7 10*3/uL (ref 0.7–4.0)
MCH: 29.1 pg (ref 26.0–34.0)
MCHC: 33.5 g/dL (ref 30.0–36.0)
MCV: 86.8 fL (ref 80.0–100.0)
Monocytes Absolute: 0.3 10*3/uL (ref 0.1–1.0)
Monocytes Relative: 5 %
Neutro Abs: 5.6 10*3/uL (ref 1.7–7.7)
Neutrophils Relative %: 85 %
Platelets: 130 10*3/uL — ABNORMAL LOW (ref 150–400)
RBC: 5.39 MIL/uL (ref 4.22–5.81)
RDW: 12.6 % (ref 11.5–15.5)
WBC: 6.6 10*3/uL (ref 4.0–10.5)
nRBC: 0 % (ref 0.0–0.2)

## 2021-10-12 ENCOUNTER — Other Ambulatory Visit: Payer: Self-pay

## 2021-10-12 DIAGNOSIS — D61818 Other pancytopenia: Secondary | ICD-10-CM

## 2021-10-13 ENCOUNTER — Encounter: Payer: Self-pay | Admitting: Oncology

## 2021-10-13 ENCOUNTER — Inpatient Hospital Stay: Payer: Medicare Other | Attending: Oncology

## 2021-10-13 ENCOUNTER — Inpatient Hospital Stay (HOSPITAL_BASED_OUTPATIENT_CLINIC_OR_DEPARTMENT_OTHER): Payer: Medicare Other | Admitting: Oncology

## 2021-10-13 DIAGNOSIS — D61818 Other pancytopenia: Secondary | ICD-10-CM

## 2021-10-13 LAB — CBC WITH DIFFERENTIAL/PLATELET
Abs Immature Granulocytes: 0.01 10*3/uL (ref 0.00–0.07)
Basophils Absolute: 0 10*3/uL (ref 0.0–0.1)
Basophils Relative: 0 %
Eosinophils Absolute: 0 10*3/uL (ref 0.0–0.5)
Eosinophils Relative: 1 %
HCT: 42.3 % (ref 39.0–52.0)
Hemoglobin: 14.3 g/dL (ref 13.0–17.0)
Immature Granulocytes: 0 %
Lymphocytes Relative: 36 %
Lymphs Abs: 1.3 10*3/uL (ref 0.7–4.0)
MCH: 29.5 pg (ref 26.0–34.0)
MCHC: 33.8 g/dL (ref 30.0–36.0)
MCV: 87.4 fL (ref 80.0–100.0)
Monocytes Absolute: 0.5 10*3/uL (ref 0.1–1.0)
Monocytes Relative: 12 %
Neutro Abs: 1.9 10*3/uL (ref 1.7–7.7)
Neutrophils Relative %: 51 %
Platelets: 111 10*3/uL — ABNORMAL LOW (ref 150–400)
RBC: 4.84 MIL/uL (ref 4.22–5.81)
RDW: 13 % (ref 11.5–15.5)
WBC: 3.7 10*3/uL — ABNORMAL LOW (ref 4.0–10.5)
nRBC: 0 % (ref 0.0–0.2)

## 2021-10-13 NOTE — Progress Notes (Signed)
Hematology/Oncology Consult note [facility]  Telephone:([phone] Fax:([phone]  Patient Care Team: [name], MD as PCP - General (Internal Medicine) Sindy Guadeloupe, MD as Consulting Physician (Hematology and Oncology)   Name of the patient: Bobby Dean  [id number]  [birth year]   Date of visit: 10/13/21  Diagnosis- autoimmune neutropenia and thrombocytopenia  Chief complaint/ Reason for visit-follow-up of autoimmune pancytopenia  Heme/Onc history: Patient is a 31 year old male was then referred to [place] for evaluation and management of thrombocytopenia. He has a history of autism and intellectual visibility and currently lives in a group home for the last 7 months. His medical decision maker is [name] from [place] office.   Blood work from 04/28/2016 showed white count of 2.1 with an H&H of 15.5/45.1 and a platelet count of 51. CMP was within normal limits. TSH was normal at 1.09. At this time I do not have any prior CBCs for comparison.   Bone marrow biopsy on 05/14/2016 showed normal trilineage hematopoiesis with no evidence of lymphoma or leukemia. Peripheral flow cytometry showed reversal of CD4 to CD8 ratio. HIV hepatitis B and hepatitis C testing was negative. B12 and folate were within normal limits. PT/PTT INR was within normal limits.   Review of peripheral smear showed leukopenia and moderate neutropenia. Thrombocytopenia. RBC morphology is normal. Scattered reactive appearing lymphocytes are seen. No blasts or early forms noted.   Monospot testing was positive. ANA was negative. Quantitative immunoglobulins were within normal limits. Serum copper was within normal limits. CMV DNA PCR negative   Patient was started on empiric high dose steroids for possible autoimmune etiology on 05/28/16. His counts normalized. Then his steroid taper was started. His WBC remained normal but his platelets began to decline 60's.   Plan  was to restart full dose steroids and switch to rituxan second line after receiving pre splenectomy vaccines in anticipation of splenectomy should he not respond to rituxan   Patient has received all his pre splenectomy vaccines. He started rituxan on 10/14/16. Seen at [facility] for second opinion by [name] who agrees with the plan.   Recurrent neutropenia/ TCP noted in sept 2019. rituxan and steroids restarted on 12/15/17 and counts normalized.  Patient has been requiring Rituxan roughly every 8 to 12 months  Interval history-he is doing well presently and denies any complaints at this time.  He is off prednisone  ECOG PS- 0 Pain scale- 0   Review of systems- Review of Systems  Constitutional:  Negative for chills, fever, malaise/fatigue and weight loss.  HENT:  Negative for congestion, ear discharge and nosebleeds.   Eyes:  Negative for blurred vision.  Respiratory:  Negative for cough, hemoptysis, sputum production, shortness of breath and wheezing.   Cardiovascular:  Negative for chest pain, palpitations, orthopnea and claudication.  Gastrointestinal:  Negative for abdominal pain, blood in stool, constipation, diarrhea, heartburn, melena, nausea and vomiting.  Genitourinary:  Negative for dysuria, flank pain, frequency, hematuria and urgency.  Musculoskeletal:  Negative for back pain, joint pain and myalgias.  Skin:  Negative for rash.  Neurological:  Negative for dizziness, tingling, focal weakness, seizures, weakness and headaches.  Endo/Heme/Allergies:  Does not bruise/bleed easily.  Psychiatric/Behavioral:  Negative for depression and suicidal ideas. The patient does not have insomnia.       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 No pertinent surgical history.  Social History   Socioeconomic History   Marital status: Single    Spouse name: Not on file   Number of  children: Not on file   Years of education: Not on file   Highest education level: Not on file  Occupational History   Not on file  Tobacco Use   Smoking status: Never   Smokeless tobacco: Never  Vaping Use   Vaping Use: Never used  Substance and Sexual Activity   Alcohol use: No   Drug use: No   Sexual activity: Not Currently  Other Topics Concern   Not on file  Social History Narrative   Not on file   Social Determinants of Health   Financial Resource Strain: Low Risk  (01/25/2021)   Overall Financial Resource Strain (CARDIA)    Difficulty of Paying Living Expenses: Not hard at all  Food Insecurity: No Food Insecurity (01/25/2021)   Hunger Vital Sign    Worried About Running Out of Food in the Last Year: Never true    Ran Out of Food in the Last Year: Never true  Transportation Needs: No Transportation Needs (01/25/2021)   PRAPARE - Hydrologist (Medical): No    Lack of Transportation (Non-Medical): No  Physical Activity: Sufficiently Active (01/25/2021)   Exercise Vital Sign    Days of Exercise per Week: 4 days    Minutes of Exercise per Session: 40 min  Stress: No Stress Concern Present (01/25/2021)   [place]    Feeling of Stress : Not at all  Social Connections: Moderately Isolated (01/25/2021)   Social Connection and Isolation Panel [NHANES]    Frequency of Communication with Friends and Family: More than three times a week    Frequency of Social Gatherings with Friends and Family: More than three times a week    Attends Religious Services: Never    Marine scientist or Organizations: Yes    Attends Music therapist: More than 4 times per year    Marital Status: Never married  Intimate Partner Violence: Not At Risk (01/25/2021)   Humiliation, Afraid, Rape, and Kick questionnaire    Fear of Current or Ex-Partner: No    Emotionally Abused: No     Physically Abused: No    Sexually Abused: No    Family History  Problem Relation Age of Onset   Cancer Mother     No current outpatient medications on file.  Physical exam:  Physical Exam Constitutional:      General: He is not in acute distress. Cardiovascular:     Rate and Rhythm: Normal rate and regular rhythm.     Heart sounds: Normal heart sounds.  Pulmonary:     Effort: Pulmonary effort is normal.  Skin:    General: Skin is warm and dry.  Neurological:     Mental Status: He is alert and oriented to person, place, and time.         Latest Ref Rng & Units 09/16/2021    7:52 AM  CMP  Glucose 70 - 99 mg/dL 55   BUN 6 - 20 mg/dL 13   Creatinine 0.61 - 1.24 mg/dL 0.65   Sodium 135 - 145 mmol/L 139   Potassium 3.5 - 5.1 mmol/L 3.2   Chloride 98 - 111 mmol/L 104   CO2 22 - 32 mmol/L 28   Calcium 8.9 - 10.3 mg/dL 8.8  Total Protein 6.5 - 8.1 g/dL 6.6   Total Bilirubin 0.3 - 1.2 mg/dL 0.7   Alkaline Phos 38 - 126 U/L 47   AST 15 - 41 U/L 16   ALT 0 - 44 U/L 13       Latest Ref Rng & Units 10/13/2021    9:35 AM  CBC  WBC 4.0 - 10.5 K/uL 3.7   Hemoglobin 13.0 - 17.0 g/dL 14.3   Hematocrit 39.0 - 52.0 % 42.3   Platelets 150 - 400 K/uL 111      Assessment and plan- Patient is a 31 y.o. male With autoimmune pancytopenia here for a routine follow-up  Patient recently received 4 more doses of Rituxan and he is currently on steroids.  Platelets are stable in the 100s to 110s presently 111.  Mild leukopenia today at 3.7 with an [place] of 1.9 somewhat worse as compared to his labs 3 weeks ago.  However labs are acceptable at this time not to require any further treatment for now.  We will repeat CBC with differential in 6 weeks and 12 weeks I will see him back in 12 weeks   Visit Diagnosis 1. 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, MD, MPH [facility] [phone] 10/13/2021 4:49 PM

## 2021-10-28 ENCOUNTER — Ambulatory Visit (INDEPENDENT_AMBULATORY_CARE_PROVIDER_SITE_OTHER): Payer: Medicare Other | Admitting: Internal Medicine

## 2021-10-28 ENCOUNTER — Encounter: Payer: Self-pay | Admitting: Internal Medicine

## 2021-10-28 VITALS — BP 116/80 | HR 59 | Ht 75.0 in | Wt 178.0 lb

## 2021-10-28 DIAGNOSIS — E876 Hypokalemia: Secondary | ICD-10-CM | POA: Diagnosis not present

## 2021-10-28 DIAGNOSIS — F419 Anxiety disorder, unspecified: Secondary | ICD-10-CM | POA: Diagnosis not present

## 2021-10-28 DIAGNOSIS — D61818 Other pancytopenia: Secondary | ICD-10-CM

## 2021-10-28 MED ORDER — POTASSIUM CHLORIDE CRYS ER 20 MEQ PO TBCR: 20.0000 meq | EXTENDED_RELEASE_TABLET | Freq: Every day | ORAL | 3 refills | Status: DC

## 2021-10-28 NOTE — Progress Notes (Signed)
Established Patient Office Visit  Subjective:  Patient ID: Bobby Dean, male    DOB: 09-02-1990  Age: 31 y.o. 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 No pertinent surgical history.  Family History  Problem Relation Age of Onset   Cancer Mother     Social History   Socioeconomic History   Marital status: Single    Spouse name: Not on file   Number of children: Not on file   Years of education: Not on file   Highest education level: Not on file  Occupational History   Not on file  Tobacco Use   Smoking status: Never   Smokeless tobacco: Never  Vaping Use   Vaping Use: Never used  Substance and Sexual Activity   Alcohol use: No   Drug use: No   Sexual activity: Not Currently  Other Topics Concern   Not on file  Social History Narrative   Not on file   Social Determinants of Health   Financial Resource Strain: Low Risk  (01/25/2021)   Overall Financial Resource Strain (CARDIA)    Difficulty of Paying Living Expenses: Not hard at all  Food Insecurity: No Food Insecurity (01/25/2021)   Hunger Vital Sign    Worried About Running Out of Food in the Last Year: Never true    [place] in the Last Year: Never true  Transportation Needs: No Transportation Needs (01/25/2021)   PRAPARE - Hydrologist (Medical): No    Lack of Transportation (Non-Medical): No  Physical Activity: Sufficiently Active (01/25/2021)   Exercise Vital Sign    Days of Exercise per Week: 4 days    Minutes of Exercise per Session: 40 min  Stress: No Stress Concern Present (01/25/2021)   [place]    Feeling of Stress : Not at all  Social Connections: Moderately Isolated  (01/25/2021)   Social Connection and Isolation Panel [NHANES]    Frequency of Communication with Friends and Family: More than three times a week    Frequency of Social Gatherings with Friends and Family: More than three times a week    Attends Religious Services: Never    Marine scientist or Organizations: Yes    Attends Music therapist: More than 4 times per year    Marital Status: Never married  Intimate Partner Violence: Not At Risk (01/25/2021)   Humiliation, Afraid, Rape, and Kick questionnaire    Fear of Current or Ex-Partner: No    Emotionally Abused: No    Physically Abused: No    Sexually Abused: No    No current outpatient medications on file.   No Known Allergies  ROS Review of Systems  Constitutional: Negative.   HENT: Negative.    Eyes: Negative.   Respiratory: Negative.    Cardiovascular: Negative.   Gastrointestinal: Negative.   Endocrine: Negative.   Genitourinary: Negative.   Musculoskeletal: Negative.   Skin: Negative.   Allergic/Immunologic: Negative.   Neurological: Negative.   Hematological: Negative.   Psychiatric/Behavioral: Negative.    All other systems reviewed and are negative.     Objective:    Physical Exam Vitals reviewed.  Constitutional:  Appearance: Normal appearance.  HENT:     Mouth/Throat:     Mouth: Mucous membranes are moist.  Eyes:     Pupils: Pupils are equal, round, and reactive to light.  Neck:     Vascular: No carotid bruit.  Cardiovascular:     Rate and Rhythm: Normal rate and regular rhythm.     Pulses: Normal pulses.     Heart sounds: Normal heart sounds.  Pulmonary:     Effort: Pulmonary effort is normal.     Breath sounds: Normal breath sounds.  Abdominal:     General: Bowel sounds are normal.     Palpations: Abdomen is soft. There is no hepatomegaly, splenomegaly or mass.     Tenderness: There is no abdominal tenderness.     Hernia: No hernia is present.  Musculoskeletal:      Cervical back: Neck supple.     Right lower leg: No edema.     Left lower leg: No edema.  Skin:    Findings: No rash.  Neurological:     Mental Status: He is alert and oriented to person, place, and time.     Motor: No weakness.  Psychiatric:        Mood and Affect: Mood normal.        Behavior: Behavior normal.     BP 116/80   Pulse (!) 59   Ht _0  (1.905 m)   Wt 178 lb (80.7 kg)   BMI 22.25 kg/m  Wt Readings from Last 3 Encounters:  10/28/21 178 lb (80.7 kg)  09/16/21 177 lb 11.1 oz (80.6 kg)  09/08/21 177 lb (80.3 kg)     Health Maintenance Due  Topic Date Due   COVID-19 Vaccine (1) 01/07/2021   INFLUENZA VACCINE  10/13/2021    There are no preventive care reminders to display for this patient.  Lab Results  Component Value Date   TSH 0.58 01/29/2021   Lab Results  Component Value Date   WBC 3.7 (L) 10/13/2021   HGB 14.3 10/13/2021   HCT 42.3 10/13/2021   MCV 87.4 10/13/2021   PLT 111 (L) 10/13/2021   Lab Results  Component Value Date   NA 139 09/16/2021   K 3.2 (L) 09/16/2021   CO2 28 09/16/2021   GLUCOSE 55 (L) 09/16/2021   BUN 13 09/16/2021   CREATININE 0.65 09/16/2021   BILITOT 0.7 09/16/2021   ALKPHOS 47 09/16/2021   AST 16 09/16/2021   ALT 13 09/16/2021   PROT 6.6 09/16/2021   ALBUMIN 3.9 09/16/2021   CALCIUM 8.8 (L) 09/16/2021   ANIONGAP 7 09/16/2021   EGFR 129 01/29/2021   Lab Results  Component Value Date   CHOL 167 01/29/2021   Lab Results  Component Value Date   HDL 59 01/29/2021   Lab Results  Component Value Date   LDLCALC 93 01/29/2021   Lab Results  Component Value Date   TRIG 60 01/29/2021   Lab Results  Component Value Date   CHOLHDL 2.8 01/29/2021   No results found for: "HGBA1C"    Assessment & Plan:   Problem List Items Addressed This Visit   None   No orders of the defined types were placed in this encounter. Patient has hypokalemia so I started the patient on potassium  supplementation.   Patient is a hematologist on a regular basis for low platelet count  Follow-up: No follow-ups on file.    Bobby Athens, MD

## 2021-10-28 NOTE — Assessment & Plan Note (Signed)
Stable

## 2021-10-28 NOTE — Assessment & Plan Note (Signed)
Stable at the present time. 

## 2021-11-24 ENCOUNTER — Inpatient Hospital Stay: Payer: Medicare Other | Attending: Oncology

## 2021-11-24 ENCOUNTER — Other Ambulatory Visit: Payer: Medicare Other

## 2021-11-24 DIAGNOSIS — D61818 Other pancytopenia: Secondary | ICD-10-CM | POA: Diagnosis not present

## 2021-11-24 LAB — CBC WITH DIFFERENTIAL/PLATELET
Abs Immature Granulocytes: 0.01 10*3/uL (ref 0.00–0.07)
Basophils Absolute: 0 10*3/uL (ref 0.0–0.1)
Basophils Relative: 1 %
Eosinophils Absolute: 0.1 10*3/uL (ref 0.0–0.5)
Eosinophils Relative: 2 %
HCT: 45.4 % (ref 39.0–52.0)
Hemoglobin: 15.2 g/dL (ref 13.0–17.0)
Immature Granulocytes: 0 %
Lymphocytes Relative: 31 %
Lymphs Abs: 1.2 10*3/uL (ref 0.7–4.0)
MCH: 29.2 pg (ref 26.0–34.0)
MCHC: 33.5 g/dL (ref 30.0–36.0)
MCV: 87.1 fL (ref 80.0–100.0)
Monocytes Absolute: 0.4 10*3/uL (ref 0.1–1.0)
Monocytes Relative: 11 %
Neutro Abs: 2.2 10*3/uL (ref 1.7–7.7)
Neutrophils Relative %: 55 %
Platelets: 152 10*3/uL (ref 150–400)
RBC: 5.21 MIL/uL (ref 4.22–5.81)
RDW: 12.6 % (ref 11.5–15.5)
WBC: 3.9 10*3/uL — ABNORMAL LOW (ref 4.0–10.5)
nRBC: 0 % (ref 0.0–0.2)

## 2021-12-31 ENCOUNTER — Ambulatory Visit (INDEPENDENT_AMBULATORY_CARE_PROVIDER_SITE_OTHER): Payer: Medicare Other | Admitting: *Deleted

## 2021-12-31 DIAGNOSIS — Z23 Encounter for immunization: Secondary | ICD-10-CM

## 2022-01-11 ENCOUNTER — Inpatient Hospital Stay: Payer: Medicare Other | Attending: Oncology

## 2022-01-11 ENCOUNTER — Encounter: Payer: Self-pay | Admitting: Oncology

## 2022-01-11 ENCOUNTER — Inpatient Hospital Stay (HOSPITAL_BASED_OUTPATIENT_CLINIC_OR_DEPARTMENT_OTHER): Payer: Medicare Other | Admitting: Oncology

## 2022-01-11 VITALS — BP 133/91 | HR 58 | Temp 98.0°F | Resp 16 | Ht 75.0 in | Wt 181.8 lb

## 2022-01-11 DIAGNOSIS — F840 Autistic disorder: Secondary | ICD-10-CM | POA: Diagnosis not present

## 2022-01-11 DIAGNOSIS — D61818 Other pancytopenia: Secondary | ICD-10-CM

## 2022-01-11 DIAGNOSIS — F79 Unspecified intellectual disabilities: Secondary | ICD-10-CM | POA: Diagnosis not present

## 2022-01-11 LAB — CBC WITH DIFFERENTIAL/PLATELET
Abs Immature Granulocytes: 0 10*3/uL (ref 0.00–0.07)
Basophils Absolute: 0 10*3/uL (ref 0.0–0.1)
Basophils Relative: 1 %
Eosinophils Absolute: 0.1 10*3/uL (ref 0.0–0.5)
Eosinophils Relative: 2 %
HCT: 45.5 % (ref 39.0–52.0)
Hemoglobin: 15 g/dL (ref 13.0–17.0)
Immature Granulocytes: 0 %
Lymphocytes Relative: 37 %
Lymphs Abs: 1.4 10*3/uL (ref 0.7–4.0)
MCH: 28.9 pg (ref 26.0–34.0)
MCHC: 33 g/dL (ref 30.0–36.0)
MCV: 87.7 fL (ref 80.0–100.0)
Monocytes Absolute: 0.4 10*3/uL (ref 0.1–1.0)
Monocytes Relative: 10 %
Neutro Abs: 1.9 10*3/uL (ref 1.7–7.7)
Neutrophils Relative %: 50 %
Platelets: 169 10*3/uL (ref 150–400)
RBC: 5.19 MIL/uL (ref 4.22–5.81)
RDW: 12.7 % (ref 11.5–15.5)
WBC: 3.8 10*3/uL — ABNORMAL LOW (ref 4.0–10.5)
nRBC: 0 % (ref 0.0–0.2)

## 2022-01-11 NOTE — Progress Notes (Signed)
Hematology/Oncology Consult note [facility]  Telephone:([phone] [phone]  Patient Care Team: [name], MD as PCP - General (Internal Medicine) Sindy Guadeloupe, MD as Consulting Physician (Hematology and Oncology)   Name of the patient: Bobby Dean  [id number]  13-Jun-1990   Date of visit: 01/11/22  Diagnosis- autoimmune neutropenia and thrombocytopenia  Chief complaint/ Reason for visit-routine follow-up of autoimmune pancytopenia  Heme/Onc history: Patient is a 31 year old male was then referred to [place] for evaluation and management of thrombocytopenia. He has a history of autism and intellectual visibility and currently lives in a group home for the last 7 months. His medical decision maker is [name] from [place] office.   Blood work from 04/28/2016 showed white count of 2.1 with an H&H of 15.5/45.1 and a platelet count of 51. CMP was within normal limits. TSH was normal at 1.09. At this time I do not have any prior CBCs for comparison.   Bone marrow biopsy on 05/14/2016 showed normal trilineage hematopoiesis with no evidence of lymphoma or leukemia. Peripheral flow cytometry showed reversal of CD4 to CD8 ratio. HIV hepatitis B and hepatitis C testing was negative. B12 and folate were within normal limits. PT/PTT INR was within normal limits.   Review of peripheral smear showed leukopenia and moderate neutropenia. Thrombocytopenia. RBC morphology is normal. Scattered reactive appearing lymphocytes are seen. No blasts or early forms noted.   Monospot testing was positive. ANA was negative. Quantitative immunoglobulins were within normal limits. Serum copper was within normal limits. CMV DNA PCR negative   Patient was started on empiric high dose steroids for possible autoimmune etiology on 05/28/16. His counts normalized. Then his steroid taper was started. His WBC remained normal but his platelets began to decline 60's.    Plan was to restart full dose steroids and switch to rituxan second line after receiving pre splenectomy vaccines in anticipation of splenectomy should he not respond to rituxan   Patient has received all his pre splenectomy vaccines. He started rituxan on 10/14/16. Seen at [facility] for second opinion by [name] who agrees with the plan.   Recurrent neutropenia/ TCP noted in sept 2019. rituxan and steroids restarted on 12/15/17 and counts normalized.  Patient has been requiring Rituxan roughly every 8 to 12 months    Interval history-patient feels well and denies any complaints at this time.  ECOG PS- 0 Pain scale- 0   Review of systems- Review of Systems  Constitutional:  Negative for chills, fever, malaise/fatigue and weight loss.  HENT:  Negative for congestion, ear discharge and nosebleeds.   Eyes:  Negative for blurred vision.  Respiratory:  Negative for cough, hemoptysis, sputum production, shortness of breath and wheezing.   Cardiovascular:  Negative for chest pain, palpitations, orthopnea and claudication.  Gastrointestinal:  Negative for abdominal pain, blood in stool, constipation, diarrhea, heartburn, melena, nausea and vomiting.  Genitourinary:  Negative for dysuria, flank pain, frequency, hematuria and urgency.  Musculoskeletal:  Negative for back pain, joint pain and myalgias.  Skin:  Negative for rash.  Neurological:  Negative for dizziness, tingling, focal weakness, seizures, weakness and headaches.  Endo/Heme/Allergies:  Does not bruise/bleed easily.  Psychiatric/Behavioral:  Negative for depression and suicidal ideas. The patient does not have insomnia.       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 No pertinent surgical history.  Social History   Socioeconomic History   Marital status: Single    Spouse name: Not on file   Number of children: Not on  file   Years of education: Not on file   Highest education level: Not on file  Occupational History   Not on file  Tobacco Use   Smoking status: Never   Smokeless tobacco: Never  Vaping Use   Vaping Use: Never used  Substance and Sexual Activity   Alcohol use: No   Drug use: No   Sexual activity: Not Currently  Other Topics Concern   Not on file  Social History Narrative   Not on file   Social Determinants of Health   Financial Resource Strain: Low Risk  (01/25/2021)   Overall Financial Resource Strain (CARDIA)    Difficulty of Paying Living Expenses: Not hard at all  Food Insecurity: No Food Insecurity (01/25/2021)   Hunger Vital Sign    Worried About Running Out of Food in the Last Year: Never true    Ran Out of Food in the Last Year: Never true  Transportation Needs: No Transportation Needs (01/25/2021)   PRAPARE - Hydrologist (Medical): No    Lack of Transportation (Non-Medical): No  Physical Activity: Sufficiently Active (01/25/2021)   Exercise Vital Sign    Days of Exercise per Week: 4 days    Minutes of Exercise per Session: 40 min  Stress: No Stress Concern Present (01/25/2021)   [place]    Feeling of Stress : Not at all  Social Connections: Moderately Isolated (01/25/2021)   Social Connection and Isolation Panel [NHANES]    Frequency of Communication with Friends and Family: More than three times a week    Frequency of Social Gatherings with Friends and Family: More than three times a week    Attends Religious Services: Never    Marine scientist or Organizations: Yes    Attends Music therapist: More than 4 times per year    Marital Status: Never married  Intimate Partner Violence: Not At Risk (01/25/2021)   Humiliation, Afraid, Rape, and Kick questionnaire    Fear of Current or Ex-Partner: No    Emotionally Abused: No    Physically Abused:  No    Sexually Abused: No    Family History  Problem Relation Age of Onset   Cancer Mother      Current Outpatient Medications:    potassium chloride SA (KLOR-CON M) 20 MEQ tablet, Take 1 tablet (20 mEq total) by mouth daily., Disp: 30 tablet, Rfl: 3  Physical exam:  Vitals:   01/11/22 0956  BP: (!) 133/91  Pulse: (!) 58  Resp: 16  Temp: 98 F (36.7 C)  TempSrc: Oral  Weight: 181 lb 12.8 oz (82.5 kg)  Height: _0  (1.905 m)   Physical Exam Constitutional:      General: He is not in acute distress. Cardiovascular:     Rate and Rhythm: Normal rate and regular rhythm.     Heart sounds: Normal heart sounds.  Pulmonary:     Effort: Pulmonary effort is normal.     Breath sounds: Normal breath sounds.  Abdominal:     General: Bowel sounds are normal.     Palpations: Abdomen is soft.  Skin:    General: Skin is warm and dry.  Neurological:     Mental Status: He is  alert and oriented to person, place, and time.         Latest Ref Rng & Units 09/16/2021    7:52 AM  CMP  Glucose 70 - 99 mg/dL 55   BUN 6 - 20 mg/dL 13   Creatinine 0.61 - 1.24 mg/dL 0.65   Sodium 135 - 145 mmol/L 139   Potassium 3.5 - 5.1 mmol/L 3.2   Chloride 98 - 111 mmol/L 104   CO2 22 - 32 mmol/L 28   Calcium 8.9 - 10.3 mg/dL 8.8   Total Protein 6.5 - 8.1 g/dL 6.6   Total Bilirubin 0.3 - 1.2 mg/dL 0.7   Alkaline Phos 38 - 126 U/L 47   AST 15 - 41 U/L 16   ALT 0 - 44 U/L 13       Latest Ref Rng & Units 01/11/2022    9:34 AM  CBC  WBC 4.0 - 10.5 K/uL 3.8   Hemoglobin 13.0 - 17.0 g/dL 15.0   Hematocrit 39.0 - 52.0 % 45.5   Platelets 150 - 400 K/uL 169      Assessment and plan- Patient is a 31 y.o. 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  It was mainly given for his thrombocytopenia.  Platelet counts have improved and normalized since then.  He has mild leukopenia/neutropenia  which is overall stable.  Continue to monitor.  We will repeat CBC with differential in 2 months 4 months in 6 months and I will see him back in 6 months   Visit Diagnosis 1. 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, MD, MPH [facility] [phone] 01/11/2022 1:08 PM

## 2022-02-06 ENCOUNTER — Other Ambulatory Visit: Payer: Self-pay | Admitting: Internal Medicine

## 2022-02-06 DIAGNOSIS — E876 Hypokalemia: Secondary | ICD-10-CM

## 2022-02-22 ENCOUNTER — Ambulatory Visit (INDEPENDENT_AMBULATORY_CARE_PROVIDER_SITE_OTHER): Payer: Medicare Other | Admitting: Internal Medicine

## 2022-02-22 ENCOUNTER — Encounter: Payer: Self-pay | Admitting: Internal Medicine

## 2022-02-22 VITALS — BP 122/88 | HR 66 | Ht 74.0 in | Wt 181.7 lb

## 2022-02-22 DIAGNOSIS — F419 Anxiety disorder, unspecified: Secondary | ICD-10-CM | POA: Diagnosis not present

## 2022-02-22 DIAGNOSIS — F840 Autistic disorder: Secondary | ICD-10-CM

## 2022-02-22 DIAGNOSIS — Z23 Encounter for immunization: Secondary | ICD-10-CM

## 2022-02-22 DIAGNOSIS — D61818 Other pancytopenia: Secondary | ICD-10-CM

## 2022-02-22 NOTE — Assessment & Plan Note (Signed)
Stable at the present time patient is seeing the hematologist

## 2022-02-22 NOTE — Assessment & Plan Note (Signed)
Patient has been vaccinated against COVID

## 2022-02-22 NOTE — Assessment & Plan Note (Signed)
Stable at the present time. 

## 2022-02-22 NOTE — Progress Notes (Signed)
Established Patient Office Visit  Subjective:  Patient ID: Bobby Dean, male    DOB: 1991-01-30  Age: 31 y.o. MRN: [id number]  CC: No chief complaint on file.   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  Family History  Problem Relation Age of Onset   Cancer Mother     Social History   Socioeconomic History   Marital status: Single    Spouse name: Not on file   Number of children: Not on file   Years of education: Not on file   Highest education level: Not on file  Occupational History   Not on file  Tobacco Use   Smoking status: Never   Smokeless tobacco: Never  Vaping Use   Vaping Use: Never used  Substance and Sexual Activity   Alcohol use: No   Drug use: No   Sexual activity: Not Currently  Other Topics Concern   Not on file  Social History Narrative   Not on file   Social Determinants of Health   Financial Resource Strain: Low Risk  (01/25/2021)   Overall Financial Resource Strain (CARDIA)    Difficulty of Paying Living Expenses: Not hard at all  Food Insecurity: No Food Insecurity (01/25/2021)   Hunger Vital Sign    Worried About Running Out of Food in the Last Year: Never true    [place] in the Last Year: Never true  Transportation Needs: No Transportation Needs (01/25/2021)   PRAPARE - Hydrologist (Medical): No    Lack of Transportation (Non-Medical): No  Physical Activity: Sufficiently Active (01/25/2021)   Exercise Vital Sign    Days of Exercise per Week: 4 days    Minutes of Exercise per Session: 40 min  Stress: No Stress Concern Present (01/25/2021)   [place]    Feeling of Stress : Not at all  Social Connections: Moderately Isolated (01/25/2021)   Social  Connection and Isolation Panel [NHANES]    Frequency of Communication with Friends and Family: More than three times a week    Frequency of Social Gatherings with Friends and Family: More than three times a week    Attends Religious Services: Never    Marine scientist or Organizations: Yes    Attends Music therapist: More than 4 times per year    Marital Status: Never married  Intimate Partner Violence: Not At Risk (01/25/2021)   Humiliation, Afraid, Rape, and Kick questionnaire    Fear of Current or Ex-Partner: No    Emotionally Abused: No    Physically Abused: No    Sexually Abused: No     Current Outpatient Medications:    potassium chloride SA (KLOR-CON M) 20 MEQ tablet, TAKE ONE TABLET BY MOUTH EVERY DAY, Disp: 90 tablet, Rfl: 0   No Known Allergies  ROS Review of Systems  Constitutional: Negative.   HENT: Negative.    Eyes: Negative.   Respiratory: Negative.    Cardiovascular: Negative.   Gastrointestinal: Negative.   Endocrine: Negative.   Genitourinary: Negative.   Musculoskeletal: Negative.   Skin: Negative.   Allergic/Immunologic: Negative.   Neurological: Negative.   Hematological: Negative.   Psychiatric/Behavioral: Negative.    All other systems  reviewed and are negative.     Objective:    Physical Exam Vitals reviewed.  Constitutional:      Appearance: Normal appearance.  HENT:     Mouth/Throat:     Mouth: Mucous membranes are moist.  Eyes:     Pupils: Pupils are equal, round, and reactive to light.  Neck:     Vascular: No carotid bruit.  Cardiovascular:     Rate and Rhythm: Normal rate and regular rhythm.     Pulses: Normal pulses.     Heart sounds: Normal heart sounds.  Pulmonary:     Effort: Pulmonary effort is normal.     Breath sounds: Normal breath sounds.  Abdominal:     General: Bowel sounds are normal.     Palpations: Abdomen is soft. There is no hepatomegaly, splenomegaly or mass.     Tenderness: There is no  abdominal tenderness.     Hernia: No hernia is present.  Musculoskeletal:     Cervical back: Neck supple.     Right lower leg: No edema.     Left lower leg: No edema.  Skin:    Findings: No rash.  Neurological:     Mental Status: He is alert and oriented to person, place, and time.     Motor: No weakness.  Psychiatric:        Mood and Affect: Mood normal.        Behavior: Behavior normal.     BP 122/88   Pulse 66   Ht _0  (1.88 m)   Wt 181 lb 11.2 oz (82.4 kg)   SpO2 98%   BMI 23.33 kg/m  Wt Readings from Last 3 Encounters:  02/22/22 181 lb 11.2 oz (82.4 kg)  01/11/22 181 lb 12.8 oz (82.5 kg)  10/28/21 178 lb (80.7 kg)     Health Maintenance Due  Topic Date Due   Medicare Annual Wellness (AWV)  01/23/2022    There are no preventive care reminders to display for this patient.  Lab Results  Component Value Date   TSH 0.58 01/29/2021   Lab Results  Component Value Date   WBC 3.8 (L) 01/11/2022   HGB 15.0 01/11/2022   HCT 45.5 01/11/2022   MCV 87.7 01/11/2022   PLT 169 01/11/2022   Lab Results  Component Value Date   NA 139 09/16/2021   K 3.2 (L) 09/16/2021   CO2 28 09/16/2021   GLUCOSE 55 (L) 09/16/2021   BUN 13 09/16/2021   CREATININE 0.65 09/16/2021   BILITOT 0.7 09/16/2021   ALKPHOS 47 09/16/2021   AST 16 09/16/2021   ALT 13 09/16/2021   PROT 6.6 09/16/2021   ALBUMIN 3.9 09/16/2021   CALCIUM 8.8 (L) 09/16/2021   ANIONGAP 7 09/16/2021   EGFR 129 01/29/2021   Lab Results  Component Value Date   CHOL 167 01/29/2021   Lab Results  Component Value Date   HDL 59 01/29/2021   Lab Results  Component Value Date   LDLCALC 93 01/29/2021   Lab Results  Component Value Date   TRIG 60 01/29/2021   Lab Results  Component Value Date   CHOLHDL 2.8 01/29/2021   No results found for: "HGBA1C"    Assessment & Plan:   Problem List Items Addressed This Visit       Hematopoietic and Hemostatic   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   Follow-up: No follow-ups on file.    [name], MD

## 2022-03-17 ENCOUNTER — Inpatient Hospital Stay: Payer: Medicare Other | Attending: Oncology

## 2022-03-17 DIAGNOSIS — D708 Other neutropenia: Secondary | ICD-10-CM | POA: Insufficient documentation

## 2022-03-17 DIAGNOSIS — D61818 Other pancytopenia: Secondary | ICD-10-CM | POA: Diagnosis not present

## 2022-03-17 DIAGNOSIS — D696 Thrombocytopenia, unspecified: Secondary | ICD-10-CM | POA: Diagnosis not present

## 2022-03-17 LAB — CBC WITH DIFFERENTIAL/PLATELET
Abs Immature Granulocytes: 0.01 10*3/uL (ref 0.00–0.07)
Basophils Absolute: 0 10*3/uL (ref 0.0–0.1)
Basophils Relative: 0 %
Eosinophils Absolute: 0.1 10*3/uL (ref 0.0–0.5)
Eosinophils Relative: 2 %
HCT: 42.8 % (ref 39.0–52.0)
Hemoglobin: 14.3 g/dL (ref 13.0–17.0)
Immature Granulocytes: 0 %
Lymphocytes Relative: 26 %
Lymphs Abs: 1.3 10*3/uL (ref 0.7–4.0)
MCH: 28.4 pg (ref 26.0–34.0)
MCHC: 33.4 g/dL (ref 30.0–36.0)
MCV: 85.1 fL (ref 80.0–100.0)
Monocytes Absolute: 0.5 10*3/uL (ref 0.1–1.0)
Monocytes Relative: 10 %
Neutro Abs: 3.1 10*3/uL (ref 1.7–7.7)
Neutrophils Relative %: 62 %
Platelets: 317 10*3/uL (ref 150–400)
RBC: 5.03 MIL/uL (ref 4.22–5.81)
RDW: 12.2 % (ref 11.5–15.5)
WBC: 5 10*3/uL (ref 4.0–10.5)
nRBC: 0 % (ref 0.0–0.2)

## 2022-05-05 ENCOUNTER — Inpatient Hospital Stay: Payer: Medicare Other | Attending: Oncology

## 2022-05-05 DIAGNOSIS — D61818 Other pancytopenia: Secondary | ICD-10-CM | POA: Diagnosis not present

## 2022-05-05 LAB — CBC WITH DIFFERENTIAL/PLATELET
Abs Immature Granulocytes: 0 10*3/uL (ref 0.00–0.07)
Basophils Absolute: 0 10*3/uL (ref 0.0–0.1)
Basophils Relative: 1 %
Eosinophils Absolute: 0.1 10*3/uL (ref 0.0–0.5)
Eosinophils Relative: 2 %
HCT: 43.6 % (ref 39.0–52.0)
Hemoglobin: 14.4 g/dL (ref 13.0–17.0)
Immature Granulocytes: 0 %
Lymphocytes Relative: 35 %
Lymphs Abs: 0.9 10*3/uL (ref 0.7–4.0)
MCH: 28.9 pg (ref 26.0–34.0)
MCHC: 33 g/dL (ref 30.0–36.0)
MCV: 87.4 fL (ref 80.0–100.0)
Monocytes Absolute: 0.3 10*3/uL (ref 0.1–1.0)
Monocytes Relative: 11 %
Neutro Abs: 1.3 10*3/uL — ABNORMAL LOW (ref 1.7–7.7)
Neutrophils Relative %: 51 %
Platelets: 146 10*3/uL — ABNORMAL LOW (ref 150–400)
RBC: 4.99 MIL/uL (ref 4.22–5.81)
RDW: 13.1 % (ref 11.5–15.5)
WBC: 2.6 10*3/uL — ABNORMAL LOW (ref 4.0–10.5)
nRBC: 0 % (ref 0.0–0.2)

## 2022-05-12 ENCOUNTER — Other Ambulatory Visit: Payer: Medicare Other

## 2022-06-02 ENCOUNTER — Other Ambulatory Visit: Payer: Self-pay | Admitting: Internal Medicine

## 2022-06-02 DIAGNOSIS — R634 Abnormal weight loss: Secondary | ICD-10-CM | POA: Diagnosis not present

## 2022-06-03 LAB — CBC WITH DIFFERENTIAL/PLATELET
Absolute Monocytes: 263 cells/uL (ref 200–950)
Basophils Absolute: 22 cells/uL (ref 0–200)
Basophils Relative: 0.6 %
Eosinophils Absolute: 50 cells/uL (ref 15–500)
Eosinophils Relative: 1.4 %
HCT: 45.5 % — ABNORMAL HIGH (ref 38.5–45.0)
Hemoglobin: 15.1 g/dL (ref 13.2–15.5)
Lymphs Abs: 1134 cells/uL (ref 850–3900)
MCH: 28.2 pg (ref 27.0–33.0)
MCHC: 33.2 g/dL (ref 32.0–36.0)
MCV: 85 fL (ref 80.0–100.0)
MPV: 11.5 fL (ref 7.5–12.5)
Monocytes Relative: 7.3 %
Neutro Abs: 2131 cells/uL (ref 1500–7800)
Neutrophils Relative %: 59.2 %
Platelets: 150 10*3/uL (ref 140–400)
RBC: 5.35 10*6/uL — ABNORMAL HIGH (ref 4.20–5.10)
RDW: 12.8 % (ref 11.0–15.0)
Total Lymphocyte: 31.5 %
WBC: 3.6 10*3/uL — ABNORMAL LOW (ref 3.8–10.8)

## 2022-06-03 LAB — BASIC METABOLIC PANEL WITH GFR
BUN: 11 mg/dL (ref 7–25)
CO2: 25 mmol/L (ref 20–32)
Calcium: 9.9 mg/dL
Chloride: 102 mmol/L (ref 98–110)
Creat: 0.7 mg/dL
Glucose, Bld: 73 mg/dL (ref 65–99)
Potassium: 4.3 mmol/L (ref 3.5–5.3)
Sodium: 139 mmol/L (ref 135–146)

## 2022-06-03 LAB — LIPID PANEL
Cholesterol: 180 mg/dL (ref <200)
HDL: 79 mg/dL
LDL Cholesterol (Calc): 87 mg/dL (calc)
Non-HDL Cholesterol (Calc): 101 mg/dL (calc) (ref <130)
Total CHOL/HDL Ratio: 2.3 (calc) (ref <5.0)
Triglycerides: 53 mg/dL (ref <150)

## 2022-06-03 LAB — TSH: TSH: 0.73 mIU/L (ref 0.40–4.50)

## 2022-07-13 ENCOUNTER — Inpatient Hospital Stay (HOSPITAL_BASED_OUTPATIENT_CLINIC_OR_DEPARTMENT_OTHER): Payer: Medicare Other | Admitting: Oncology

## 2022-07-13 ENCOUNTER — Inpatient Hospital Stay: Payer: Medicare Other | Attending: Oncology

## 2022-07-13 ENCOUNTER — Encounter: Payer: Self-pay | Admitting: Oncology

## 2022-07-13 VITALS — BP 122/74 | HR 82 | Temp 98.5°F | Resp 16 | Ht 74.0 in | Wt 169.1 lb

## 2022-07-13 DIAGNOSIS — Z79899 Other long term (current) drug therapy: Secondary | ICD-10-CM | POA: Diagnosis not present

## 2022-07-13 DIAGNOSIS — D61818 Other pancytopenia: Secondary | ICD-10-CM | POA: Diagnosis not present

## 2022-07-13 DIAGNOSIS — F840 Autistic disorder: Secondary | ICD-10-CM | POA: Diagnosis not present

## 2022-07-13 LAB — CBC WITH DIFFERENTIAL/PLATELET
Abs Immature Granulocytes: 0.01 10*3/uL (ref 0.00–0.07)
Basophils Absolute: 0 10*3/uL (ref 0.0–0.1)
Basophils Relative: 0 %
Eosinophils Absolute: 0.1 10*3/uL (ref 0.0–0.5)
Eosinophils Relative: 2 %
HCT: 44.3 % (ref 39.0–52.0)
Hemoglobin: 14.7 g/dL (ref 13.0–17.0)
Immature Granulocytes: 0 %
Lymphocytes Relative: 17 %
Lymphs Abs: 1 10*3/uL (ref 0.7–4.0)
MCH: 29.2 pg (ref 26.0–34.0)
MCHC: 33.2 g/dL (ref 30.0–36.0)
MCV: 88.1 fL (ref 80.0–100.0)
Monocytes Absolute: 0.6 10*3/uL (ref 0.1–1.0)
Monocytes Relative: 11 %
Neutro Abs: 4 10*3/uL (ref 1.7–7.7)
Neutrophils Relative %: 70 %
Platelets: 139 10*3/uL — ABNORMAL LOW (ref 150–400)
RBC: 5.03 MIL/uL (ref 4.22–5.81)
RDW: 13.2 % (ref 11.5–15.5)
WBC: 5.8 10*3/uL (ref 4.0–10.5)
nRBC: 0 % (ref 0.0–0.2)

## 2022-07-13 NOTE — Progress Notes (Signed)
Hematology/Oncology Consult note [facility]  [phone] Fax:([phone]  Patient Care Team: Corky Downs, MD as PCP - General (Internal Medicine) Creig Hines, MD as Consulting Physician (Hematology and Oncology)   Name of the patient: Bobby Dean  [id number]  [birth year]   Date of visit: 07/13/22  Diagnosis- autoimmune neutropenia and thrombocytopenia   Chief complaint/ Reason for visit-routine follow-up of autoimmune pancytopenia  Heme/Onc history: Patient is a 32 year old male was then referred to [place] for evaluation and management of thrombocytopenia. He has a history of autism and intellectual visibility and currently lives in a group home for the last 7 months. His medical decision maker is [name] from [place] [facility] office.   Blood work from 04/28/2016 showed white count of 2.1 with an H&H of 15.5/45.1 and a platelet count of 51. CMP was within normal limits. TSH was normal at 1.09. At this time I do not have any prior CBCs for comparison.   Bone marrow biopsy on 05/14/2016 showed normal trilineage hematopoiesis with no evidence of lymphoma or leukemia. Peripheral flow cytometry showed reversal of CD4 to CD8 ratio. HIV hepatitis B and hepatitis C testing was negative. B12 and folate were within normal limits. PT/PTT INR was within normal limits.   Review of peripheral smear showed leukopenia and moderate neutropenia. Thrombocytopenia. RBC morphology is normal. Scattered reactive appearing lymphocytes are seen. No blasts or early forms noted.   Monospot testing was positive. ANA was negative. Quantitative immunoglobulins were within normal limits. Serum copper was within normal limits. CMV DNA PCR negative   Patient was started on empiric high dose steroids for possible autoimmune etiology on 05/28/16. His counts normalized. Then his steroid taper was started. His WBC remained normal but his platelets began to decline  60's.   Plan was to restart full dose steroids and switch to rituxan second line after receiving pre splenectomy vaccines in anticipation of splenectomy should he not respond to rituxan   Patient has received all his pre splenectomy vaccines. He started rituxan on 10/14/16. Seen at [facility] for second opinion by Dr. Anise Salvo who agrees with the plan.   Recurrent neutropenia/ TCP noted in sept 2019. rituxan and steroids restarted on 12/15/17 and counts normalized.  Patient has been requiring Rituxan roughly every 8 to 12 months    Interval history-patient lost about 15 pounds of weight over the last couple of months but his appetite is slowly returning.  Denies any complaints today  ECOG PS- 0 Pain scale- 0   Review of systems- Review of Systems  Constitutional:  Negative for chills, fever, malaise/fatigue and weight loss.  HENT:  Negative for congestion, ear discharge and nosebleeds.   Eyes:  Negative for blurred vision.  Respiratory:  Negative for cough, hemoptysis, sputum production, shortness of breath and wheezing.   Cardiovascular:  Negative for chest pain, palpitations, orthopnea and claudication.  Gastrointestinal:  Negative for abdominal pain, blood in stool, constipation, diarrhea, heartburn, melena, nausea and vomiting.  Genitourinary:  Negative for dysuria, flank pain, frequency, hematuria and urgency.  Musculoskeletal:  Negative for back pain, joint pain and myalgias.  Skin:  Negative for rash.  Neurological:  Negative for dizziness, tingling, focal weakness, seizures, weakness and headaches.  Endo/Heme/Allergies:  Does not bruise/bleed easily.  Psychiatric/Behavioral:  Negative for depression and suicidal ideas. The patient does not have insomnia.       No Known Allergies   Past Medical History:  Diagnosis Date   Anxiety  Autism    Autism    Autoimmune pancytopenia (HCC)      History reviewed. No pertinent surgical history.  Social History   Socioeconomic History    Marital status: Single    Spouse name: Not on file   Number of children: Not on file   Years of education: Not on file   Highest education level: Not on file  Occupational History   Not on file  Tobacco Use   Smoking status: Never   Smokeless tobacco: Never  Vaping Use   Vaping Use: Never used  Substance and Sexual Activity   Alcohol use: No   Drug use: No   Sexual activity: Not Currently  Other Topics Concern   Not on file  Social History Narrative   Not on file   Social Determinants of Health   Financial Resource Strain: Low Risk  (01/25/2021)   Overall Financial Resource Strain (CARDIA)    Difficulty of Paying Living Expenses: Not hard at all  Food Insecurity: No Food Insecurity (01/25/2021)   Hunger Vital Sign    Worried About Running Out of Food in the Last Year: Never true    Ran Out of Food in the Last Year: Never true  Transportation Needs: No Transportation Needs (01/25/2021)   PRAPARE - Administrator, Civil Service (Medical): No    Lack of Transportation (Non-Medical): No  Physical Activity: Sufficiently Active (01/25/2021)   Exercise Vital Sign    Days of Exercise per Week: 4 days    Minutes of Exercise per Session: 40 min  Stress: No Stress Concern Present (01/25/2021)   [name] of Occupational Health - Occupational Stress Questionnaire    Feeling of Stress : Not at all  Social Connections: Moderately Isolated (01/25/2021)   Social Connection and Isolation Panel [NHANES]    Frequency of Communication with Friends and Family: More than three times a week    Frequency of Social Gatherings with Friends and Family: More than three times a week    Attends Religious Services: Never    Database administrator or Organizations: Yes    Attends Engineer, structural: More than 4 times per year    Marital Status: Never married  Intimate Partner Violence: Not At Risk (01/25/2021)   Humiliation, Afraid, Rape, and Kick questionnaire     Fear of Current or Ex-Partner: No    Emotionally Abused: No    Physically Abused: No    Sexually Abused: No    Family History  Problem Relation Age of Onset   Cancer Mother      Current Outpatient Medications:    potassium chloride SA (KLOR-CON M) 20 MEQ tablet, TAKE ONE TABLET BY MOUTH EVERY DAY, Disp: 90 tablet, Rfl: 0  Physical exam:  Vitals:   07/13/22 1000  BP: 122/74  Pulse: 82  Resp: 16  Temp: 98.5 F (36.9 C)  TempSrc: Tympanic  SpO2: 100%  Weight: 169 lb 1.6 oz (76.7 kg)  Height: 6\' 2"  (1.88 m)   Physical Exam HENT:     Mouth/Throat:     Mouth: Mucous membranes are moist.     Pharynx: Oropharynx is clear.  Cardiovascular:     Rate and Rhythm: Normal rate and regular rhythm.     Heart sounds: Normal heart sounds.  Pulmonary:     Effort: Pulmonary effort is normal.     Breath sounds: Normal breath sounds.  Abdominal:     General: Bowel sounds are normal.  Palpations: Abdomen is soft.  Lymphadenopathy:     Comments: No palpable cervical, supraclavicular, axillary or inguinal adenopathy    Skin:    General: Skin is warm and dry.  Neurological:     Mental Status: He is alert and oriented to person, place, and time.         Latest Ref Rng & Units 06/02/2022   12:11 PM  CMP  Glucose 65 - 99 mg/dL 73   BUN 7 - 25 mg/dL 11   Creatinine mg/dL 9.14   Sodium 782 - 956 mmol/L 139   Potassium 3.5 - 5.3 mmol/L 4.3   Chloride 98 - 110 mmol/L 102   CO2 20 - 32 mmol/L 25   Calcium mg/dL 9.9       Latest Ref Rng & Units 07/13/2022    9:50 AM  CBC  WBC 4.0 - 10.5 K/uL 5.8   Hemoglobin 13.0 - 17.0 g/dL 21.3   Hematocrit 08.6 - 52.0 % 44.3   Platelets 150 - 400 K/uL 139      Assessment and plan- Patient is a 31 y.o. 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  He is here for routine follow-up  White cell count is normal today with a normal ANC.  Platelets are mildly low at 139.  Overall counts remain stable  and he does not require any Rituxan at this time.  He last received 4 doses of weekly Rituxan in July 2023.  I will continue to monitor CBC with differential every 2 months and see him back in 6 months   Visit Diagnosis 1. Autoimmune pancytopenia (HCC)      Dr. Owens Shark, MD, MPH [facility] at [facility] [phone] 07/13/2022 12:47 PM

## 2022-09-06 DIAGNOSIS — F840 Autistic disorder: Secondary | ICD-10-CM | POA: Diagnosis not present

## 2022-09-06 DIAGNOSIS — D696 Thrombocytopenia, unspecified: Secondary | ICD-10-CM | POA: Diagnosis not present

## 2022-09-06 DIAGNOSIS — R634 Abnormal weight loss: Secondary | ICD-10-CM | POA: Diagnosis not present

## 2022-09-13 ENCOUNTER — Inpatient Hospital Stay: Payer: Medicare Other

## 2022-09-14 ENCOUNTER — Inpatient Hospital Stay: Payer: Medicare Other | Attending: Oncology

## 2022-09-14 DIAGNOSIS — D61818 Other pancytopenia: Secondary | ICD-10-CM | POA: Diagnosis not present

## 2022-09-14 LAB — CBC WITH DIFFERENTIAL/PLATELET
Abs Immature Granulocytes: 0.01 10*3/uL (ref 0.00–0.07)
Basophils Absolute: 0 10*3/uL (ref 0.0–0.1)
Basophils Relative: 1 %
Eosinophils Absolute: 0.1 10*3/uL (ref 0.0–0.5)
Eosinophils Relative: 3 %
HCT: 41.1 % (ref 39.0–52.0)
Hemoglobin: 13.7 g/dL (ref 13.0–17.0)
Immature Granulocytes: 0 %
Lymphocytes Relative: 32 %
Lymphs Abs: 1.3 10*3/uL (ref 0.7–4.0)
MCH: 29.4 pg (ref 26.0–34.0)
MCHC: 33.3 g/dL (ref 30.0–36.0)
MCV: 88.2 fL (ref 80.0–100.0)
Monocytes Absolute: 0.4 10*3/uL (ref 0.1–1.0)
Monocytes Relative: 10 %
Neutro Abs: 2.1 10*3/uL (ref 1.7–7.7)
Neutrophils Relative %: 54 %
Platelets: 154 10*3/uL (ref 150–400)
RBC: 4.66 MIL/uL (ref 4.22–5.81)
RDW: 12.5 % (ref 11.5–15.5)
WBC: 3.9 10*3/uL — ABNORMAL LOW (ref 4.0–10.5)
nRBC: 0 % (ref 0.0–0.2)

## 2022-11-11 ENCOUNTER — Inpatient Hospital Stay: Payer: Medicare Other | Attending: Oncology

## 2022-11-11 DIAGNOSIS — D61818 Other pancytopenia: Secondary | ICD-10-CM | POA: Insufficient documentation

## 2022-11-11 LAB — CBC WITH DIFFERENTIAL/PLATELET
Abs Immature Granulocytes: 0.01 10*3/uL (ref 0.00–0.07)
Basophils Absolute: 0 10*3/uL (ref 0.0–0.1)
Basophils Relative: 0 %
Eosinophils Absolute: 0.1 10*3/uL (ref 0.0–0.5)
Eosinophils Relative: 2 %
HCT: 42.4 % (ref 39.0–52.0)
Hemoglobin: 13.8 g/dL (ref 13.0–17.0)
Immature Granulocytes: 0 %
Lymphocytes Relative: 32 %
Lymphs Abs: 1.6 10*3/uL (ref 0.7–4.0)
MCH: 29 pg (ref 26.0–34.0)
MCHC: 32.5 g/dL (ref 30.0–36.0)
MCV: 89.1 fL (ref 80.0–100.0)
Monocytes Absolute: 0.5 10*3/uL (ref 0.1–1.0)
Monocytes Relative: 10 %
Neutro Abs: 2.7 10*3/uL (ref 1.7–7.7)
Neutrophils Relative %: 56 %
Platelets: 157 10*3/uL (ref 150–400)
RBC: 4.76 MIL/uL (ref 4.22–5.81)
RDW: 12.6 % (ref 11.5–15.5)
WBC: 5 10*3/uL (ref 4.0–10.5)
nRBC: 0 % (ref 0.0–0.2)

## 2022-11-12 ENCOUNTER — Other Ambulatory Visit: Payer: Medicare Other

## 2022-11-24 DIAGNOSIS — F840 Autistic disorder: Secondary | ICD-10-CM | POA: Diagnosis not present

## 2022-11-24 DIAGNOSIS — D696 Thrombocytopenia, unspecified: Secondary | ICD-10-CM | POA: Diagnosis not present

## 2022-11-24 DIAGNOSIS — R634 Abnormal weight loss: Secondary | ICD-10-CM | POA: Diagnosis not present

## 2022-12-22 DIAGNOSIS — Z23 Encounter for immunization: Secondary | ICD-10-CM | POA: Diagnosis not present

## 2023-01-12 ENCOUNTER — Inpatient Hospital Stay: Payer: Medicare Other

## 2023-01-12 ENCOUNTER — Ambulatory Visit: Payer: Medicare Other | Admitting: Oncology

## 2023-01-12 ENCOUNTER — Inpatient Hospital Stay: Payer: Medicare Other | Admitting: Oncology

## 2023-01-12 ENCOUNTER — Inpatient Hospital Stay: Payer: Medicare Other | Attending: Oncology

## 2023-01-12 ENCOUNTER — Encounter: Payer: Self-pay | Admitting: Oncology

## 2023-01-12 VITALS — BP 128/83 | HR 77 | Temp 98.1°F | Resp 18 | Ht 74.0 in | Wt 176.1 lb

## 2023-01-12 DIAGNOSIS — D61818 Other pancytopenia: Secondary | ICD-10-CM

## 2023-01-12 DIAGNOSIS — D709 Neutropenia, unspecified: Secondary | ICD-10-CM | POA: Insufficient documentation

## 2023-01-12 DIAGNOSIS — D696 Thrombocytopenia, unspecified: Secondary | ICD-10-CM | POA: Insufficient documentation

## 2023-01-12 LAB — CBC WITH DIFFERENTIAL/PLATELET
Abs Immature Granulocytes: 0 10*3/uL (ref 0.00–0.07)
Abs Immature Granulocytes: 0 10*3/uL (ref 0.00–0.07)
Basophils Absolute: 0 10*3/uL (ref 0.0–0.1)
Basophils Absolute: 0 10*3/uL (ref 0.0–0.1)
Basophils Relative: 1 %
Basophils Relative: 1 %
Eosinophils Absolute: 0.1 10*3/uL (ref 0.0–0.5)
Eosinophils Absolute: 0 10*3/uL (ref 0.0–0.5)
Eosinophils Relative: 3 %
Eosinophils Relative: 2 %
HCT: 42.7 % (ref 39.0–52.0)
HCT: 41.6 % (ref 39.0–52.0)
Hemoglobin: 14.1 g/dL (ref 13.0–17.0)
Hemoglobin: 14.2 g/dL (ref 13.0–17.0)
Immature Granulocytes: 0 %
Immature Granulocytes: 0 %
Lymphocytes Relative: 54 %
Lymphocytes Relative: 56 %
Lymphs Abs: 1 10*3/uL (ref 0.7–4.0)
Lymphs Abs: 1.1 10*3/uL (ref 0.7–4.0)
MCH: 28.7 pg (ref 26.0–34.0)
MCH: 29.1 pg (ref 26.0–34.0)
MCHC: 33 g/dL (ref 30.0–36.0)
MCHC: 34.1 g/dL (ref 30.0–36.0)
MCV: 86.8 fL (ref 80.0–100.0)
MCV: 85.2 fL (ref 80.0–100.0)
Monocytes Absolute: 0.6 10*3/uL (ref 0.1–1.0)
Monocytes Absolute: 0.6 10*3/uL (ref 0.1–1.0)
Monocytes Relative: 30 %
Monocytes Relative: 30 %
Neutro Abs: 0.2 10*3/uL — CL (ref 1.7–7.7)
Neutro Abs: 0.2 10*3/uL — CL (ref 1.7–7.7)
Neutrophils Relative %: 12 %
Neutrophils Relative %: 11 %
Platelets: 83 10*3/uL — ABNORMAL LOW (ref 150–400)
Platelets: 84 10*3/uL — ABNORMAL LOW (ref 150–400)
RBC: 4.92 MIL/uL (ref 4.22–5.81)
RBC: 4.88 MIL/uL (ref 4.22–5.81)
RDW: 12.3 % (ref 11.5–15.5)
RDW: 12.2 % (ref 11.5–15.5)
WBC: 1.9 10*3/uL — ABNORMAL LOW (ref 4.0–10.5)
WBC: 2 10*3/uL — ABNORMAL LOW (ref 4.0–10.5)
nRBC: 0 % (ref 0.0–0.2)
nRBC: 0 % (ref 0.0–0.2)

## 2023-01-12 LAB — HEPATITIS B SURFACE ANTIGEN: Hepatitis B Surface Ag: NONREACTIVE

## 2023-01-12 LAB — HEPATITIS B CORE ANTIBODY, TOTAL: Hep B Core Total Ab: NONREACTIVE

## 2023-01-12 NOTE — Progress Notes (Signed)
Hematology/Oncology Consult note [facility]  Telephone:([phone] Fax:([phone]  Patient Care Team: Corky Downs, MD as PCP - General (Internal Medicine) Creig Hines, MD as Consulting Physician (Hematology and Oncology)   Name of the patient: Bobby Dean  [id number]  September 07, 1990   Date of visit: 01/12/23  Diagnosis-autoimmune neutropenia and thrombocytopenia  Chief complaint/ Reason for visit-routine follow-up of autoimmune pancytopenia  Heme/Onc history:  Patient is a 32 year old male was then referred to [place] for evaluation and management of thrombocytopenia. He has a history of autism and intellectual visibility and currently lives in a group home for the last 7 months. His medical decision maker is [name] from [place] [facility] office.   Blood work from 04/28/2016 showed white count of 2.1 with an H&H of 15.5/45.1 and a platelet count of 51. CMP was within normal limits. TSH was normal at 1.09. At this time I do not have any prior CBCs for comparison.   Bone marrow biopsy on 05/14/2016 showed normal trilineage hematopoiesis with no evidence of lymphoma or leukemia. Peripheral flow cytometry showed reversal of CD4 to CD8 ratio. HIV hepatitis B and hepatitis C testing was negative. B12 and folate were within normal limits. PT/PTT INR was within normal limits.   Review of peripheral smear showed leukopenia and moderate neutropenia. Thrombocytopenia. RBC morphology is normal. Scattered reactive appearing lymphocytes are seen. No blasts or early forms noted.   Monospot testing was positive. ANA was negative. Quantitative immunoglobulins were within normal limits. Serum copper was within normal limits. CMV DNA PCR negative   Patient was started on empiric high dose steroids for possible autoimmune etiology on 05/28/16. His counts normalized. Then his steroid taper was started. His WBC remained normal but his platelets began to decline 60's.    Plan was to restart full dose steroids and switch to rituxan second line after receiving pre splenectomy vaccines in anticipation of splenectomy should he not respond to rituxan   Patient has received all his pre splenectomy vaccines. He started rituxan on 10/14/16. Seen at [facility] for second opinion by Dr. Anise Salvo who agrees with the plan.   Recurrent neutropenia/ TCP noted in sept 2019. rituxan and steroids restarted on 12/15/17 and counts normalized.  Patient has been requiring Rituxan roughly every 8 to 12 months    Interval history-patient is doing well presently and denies any complaints at this time.  He has not had any recurrent infections.  Denies any bleeding or bruising  ECOG PS- 0 Pain scale- 0   Review of systems- Review of Systems  Constitutional:  Negative for chills, fever, malaise/fatigue and weight loss.  HENT:  Negative for congestion, ear discharge and nosebleeds.   Eyes:  Negative for blurred vision.  Respiratory:  Negative for cough, hemoptysis, sputum production, shortness of breath and wheezing.   Cardiovascular:  Negative for chest pain, palpitations, orthopnea and claudication.  Gastrointestinal:  Negative for abdominal pain, blood in stool, constipation, diarrhea, heartburn, melena, nausea and vomiting.  Genitourinary:  Negative for dysuria, flank pain, frequency, hematuria and urgency.  Musculoskeletal:  Negative for back pain, joint pain and myalgias.  Skin:  Negative for rash.  Neurological:  Negative for dizziness, tingling, focal weakness, seizures, weakness and headaches.  Endo/Heme/Allergies:  Does not bruise/bleed easily.  Psychiatric/Behavioral:  Negative for depression and suicidal ideas. The patient does not have insomnia.       No Known Allergies   Past Medical History:  Diagnosis Date   Anxiety  Autism    Autism    Autoimmune pancytopenia (HCC)      History reviewed. No pertinent surgical history.  Social History   Socioeconomic History    Marital status: Single    Spouse name: Not on file   Number of children: Not on file   Years of education: Not on file   Highest education level: Not on file  Occupational History   Not on file  Tobacco Use   Smoking status: Never   Smokeless tobacco: Never  Vaping Use   Vaping status: Never Used  Substance and Sexual Activity   Alcohol use: No   Drug use: No   Sexual activity: Not Currently  Other Topics Concern   Not on file  Social History Narrative   Not on file   Social Determinants of Health   Financial Resource Strain: Low Risk  (01/25/2021)   Overall Financial Resource Strain (CARDIA)    Difficulty of Paying Living Expenses: Not hard at all  Food Insecurity: No Food Insecurity (01/25/2021)   Hunger Vital Sign    Worried About Running Out of Food in the Last Year: Never true    Ran Out of Food in the Last Year: Never true  Transportation Needs: No Transportation Needs (01/25/2021)   PRAPARE - Administrator, Civil Service (Medical): No    Lack of Transportation (Non-Medical): No  Physical Activity: Sufficiently Active (01/25/2021)   Exercise Vital Sign    Days of Exercise per Week: 4 days    Minutes of Exercise per Session: 40 min  Stress: No Stress Concern Present (01/25/2021)   [name] of Occupational Health - Occupational Stress Questionnaire    Feeling of Stress : Not at all  Social Connections: Moderately Isolated (01/25/2021)   Social Connection and Isolation Panel [NHANES]    Frequency of Communication with Friends and Family: More than three times a week    Frequency of Social Gatherings with Friends and Family: More than three times a week    Attends Religious Services: Never    Database administrator or Organizations: Yes    Attends Engineer, structural: More than 4 times per year    Marital Status: Never married  Intimate Partner Violence: Not At Risk (01/25/2021)   Humiliation, Afraid, Rape, and Kick questionnaire     Fear of Current or Ex-Partner: No    Emotionally Abused: No    Physically Abused: No    Sexually Abused: No    Family History  Problem Relation Age of Onset   Cancer Mother      Current Outpatient Medications:    potassium chloride SA (KLOR-CON M) 20 MEQ tablet, TAKE ONE TABLET BY MOUTH EVERY DAY, Disp: 90 tablet, Rfl: 0  Physical exam:  Vitals:   01/12/23 1045  BP: 128/83  Pulse: 77  Resp: 18  Temp: 98.1 F (36.7 C)  TempSrc: Tympanic  SpO2: 99%  Weight: 176 lb 1.6 oz (79.9 kg)  Height: 6\' 2"  (1.88 m)   Physical Exam Cardiovascular:     Rate and Rhythm: Normal rate and regular rhythm.     Heart sounds: Normal heart sounds.  Pulmonary:     Effort: Pulmonary effort is normal.     Breath sounds: Normal breath sounds.  Abdominal:     General: Bowel sounds are normal.     Palpations: Abdomen is soft.  Skin:    General: Skin is warm and dry.  Neurological:     Mental Status:  He is alert and oriented to person, place, and time.         Latest Ref Rng & Units 06/02/2022   12:11 PM  CMP  Glucose 65 - 99 mg/dL 73   BUN 7 - 25 mg/dL 11   Creatinine mg/dL 1.61   Sodium 096 - 045 mmol/L 139   Potassium 3.5 - 5.3 mmol/L 4.3   Chloride 98 - 110 mmol/L 102   CO2 20 - 32 mmol/L 25   Calcium mg/dL 9.9       Latest Ref Rng & Units 01/12/2023   11:42 AM  CBC  WBC 4.0 - 10.5 K/uL 2.0   Hemoglobin 13.0 - 17.0 g/dL 40.9   Hematocrit 81.1 - 52.0 % 41.6   Platelets 150 - 400 K/uL 84      Assessment and plan- Patient is a 32 y.o. male with autoimmune pancytopenia with predominant neutropenia and thrombocytopenia.he is here for a routine follow-up  Patient has required Rituxan weekly x 4 twice now.  He needed it in October 2019, December 2021 and then June 2023.  Each time we treated it mainly for predominant thrombocytopenia and his platelet counts dropped to 30s.  He has had intermittent neutropenia but his ANC has been more than 1 historically.  Today his white  count is down to 2 with an ANC of 0.2 and platelets down to 84 from a prior value of 157 2 months ago.  I suspect this is another flareup of his autoimmune pancytopenia and he would need weekly Rituxan x 4 at this time.  I have also repeated his CBC with differential to confirm that this is a true value.  Hepatitis testing is pending.  Prior hepatitis B testing has been negative.  Discussed risks and benefits of Rituxan including all but not limited to possible risk of infusion reaction, low blood counts and risk of infection with the patient and his caregiver.  They are agreeable to proceed but I will also discuss with his legal guardian before proceeding with Rituxan.  If he does not respond to Rituxan I will repeat bone marrow biopsy at that time.  He has had a prior bone marrow biopsy in 2019 which did not show any primary bone marrow pathology that would explain his neutropenia and thrombocytopenia.  Once we obtain consent from legal guardian we will proceed with weekly Rituxan treatment likely next week   Visit Diagnosis 1. Autoimmune pancytopenia (HCC)      Dr. Owens Shark, MD, MPH [facility] at [facility] [phone] 01/12/2023 12:55 PM

## 2023-01-13 LAB — HEPATITIS B SURFACE ANTIBODY, QUANTITATIVE: Hep B S AB Quant (Post): <3.5 m[IU]/mL — ABNORMAL LOW

## 2023-01-14 ENCOUNTER — Encounter: Payer: Self-pay | Admitting: Oncology

## 2023-01-14 ENCOUNTER — Other Ambulatory Visit: Payer: Self-pay | Admitting: Oncology

## 2023-01-14 DIAGNOSIS — D61818 Other pancytopenia: Secondary | ICD-10-CM

## 2023-01-17 ENCOUNTER — Telehealth: Payer: Self-pay | Admitting: *Deleted

## 2023-01-17 ENCOUNTER — Other Ambulatory Visit: Payer: Self-pay | Admitting: *Deleted

## 2023-01-17 ENCOUNTER — Encounter: Payer: Self-pay | Admitting: Oncology

## 2023-01-17 ENCOUNTER — Other Ambulatory Visit: Payer: Self-pay | Admitting: Oncology

## 2023-01-17 MED ORDER — PREDNISONE 20 MG PO TABS: 60.0000 mg | ORAL_TABLET | Freq: Every day | ORAL | 0 refills | Status: DC

## 2023-01-17 NOTE — Telephone Encounter (Signed)
I have called Bobby Dean and let her know that we are starting with Bobby Dean with his rituxan tomorrow arrival at 8 AM. Both Bobby Dean and Bobby Dean was notified that he is coming tomorrow.  I then had to call in prednisone 20 mg tablets and he is going to take 3 tablets a day for 2 weeks and have already sent today in to the pharmacy and Bobby Dean is going to pick it up in the morning and have him take it then started

## 2023-01-17 NOTE — Telephone Encounter (Signed)
Bobby Dean from [facility] is the name. In the above message I put Lawson Fiscal in there and that is wrong name.

## 2023-01-19 ENCOUNTER — Inpatient Hospital Stay: Payer: Medicare Other | Attending: Oncology

## 2023-01-19 VITALS — BP 150/96 | HR 85 | Temp 97.0°F | Resp 17

## 2023-01-19 DIAGNOSIS — F79 Unspecified intellectual disabilities: Secondary | ICD-10-CM | POA: Diagnosis not present

## 2023-01-19 DIAGNOSIS — Z7952 Long term (current) use of systemic steroids: Secondary | ICD-10-CM | POA: Diagnosis not present

## 2023-01-19 DIAGNOSIS — F840 Autistic disorder: Secondary | ICD-10-CM | POA: Insufficient documentation

## 2023-01-19 DIAGNOSIS — D704 Cyclic neutropenia: Secondary | ICD-10-CM | POA: Insufficient documentation

## 2023-01-19 DIAGNOSIS — D693 Immune thrombocytopenic purpura: Secondary | ICD-10-CM | POA: Insufficient documentation

## 2023-01-19 DIAGNOSIS — D61818 Other pancytopenia: Secondary | ICD-10-CM

## 2023-01-19 MED ORDER — SODIUM CHLORIDE 0.9 % IV SOLN
INTRAVENOUS | Status: DC
  Filled 2023-01-19: qty 250

## 2023-01-19 MED ORDER — SODIUM CHLORIDE 0.9 % IV SOLN
375.0000 mg/m2 | Freq: Once | INTRAVENOUS | Status: AC
  Administered 2023-01-19: 800 mg via INTRAVENOUS
  Filled 2023-01-19: qty 50

## 2023-01-19 MED ORDER — ACETAMINOPHEN 325 MG PO TABS
650.0000 mg | ORAL_TABLET | Freq: Once | ORAL | Status: AC
  Administered 2023-01-19: 650 mg via ORAL
  Filled 2023-01-19: qty 2

## 2023-01-19 MED ORDER — SODIUM CHLORIDE 0.9% FLUSH
10.0000 mL | INTRAVENOUS | Status: DC | PRN
  Administered 2023-01-19: 10 mL
  Filled 2023-01-19: qty 10

## 2023-01-19 MED ORDER — DIPHENHYDRAMINE HCL 25 MG PO CAPS
50.0000 mg | ORAL_CAPSULE | Freq: Once | ORAL | Status: AC
  Administered 2023-01-19: 50 mg via ORAL
  Filled 2023-01-19: qty 2

## 2023-01-19 NOTE — Progress Notes (Signed)
Patient tolerated Rituximab titrated 50mg /hr increments. No symptoms/ concerns. Discharged with caregiver Carney Bern. Stable

## 2023-01-19 NOTE — Patient Instructions (Signed)
[place] [facility] - A DEPT OF [facility]  Discharge Instructions: Thank you for choosing [facility] to provide your oncology and hematology care.  If you have a lab appointment with the Cancer Center, please go directly to the Cancer Center and check in at the registration area.  Wear comfortable clothing and clothing appropriate for easy access to any Portacath or PICC line.   We strive to give you quality time with your provider. You may need to reschedule your appointment if you arrive late (15 or more minutes).  Arriving late affects you and other patients whose appointments are after yours.  Also, if you miss three or more appointments without notifying the office, you may be dismissed from the clinic at the provider's discretion.      For prescription refill requests, have your pharmacy contact our office and allow 72 hours for refills to be completed.    Today you received the following chemotherapy and/or immunotherapy agents: riTUXimab  To help prevent nausea and vomiting after your treatment, we encourage you to take your nausea medication as directed.  BELOW ARE SYMPTOMS THAT SHOULD BE REPORTED IMMEDIATELY: *FEVER GREATER THAN 100.4 F (38 C) OR HIGHER *CHILLS OR SWEATING *NAUSEA AND VOMITING THAT IS NOT CONTROLLED WITH YOUR NAUSEA MEDICATION *UNUSUAL SHORTNESS OF BREATH *UNUSUAL BRUISING OR BLEEDING *URINARY PROBLEMS (pain or burning when urinating, or frequent urination) *BOWEL PROBLEMS (unusual diarrhea, constipation, pain near the anus) TENDERNESS IN MOUTH AND THROAT WITH OR WITHOUT PRESENCE OF ULCERS (sore throat, sores in mouth, or a toothache) UNUSUAL RASH, SWELLING OR PAIN  UNUSUAL VAGINAL DISCHARGE OR ITCHING   Items with * indicate a potential emergency and should be followed up as soon as possible or go to the Emergency Department if any problems should occur.  Please show the CHEMOTHERAPY ALERT CARD or IMMUNOTHERAPY ALERT  CARD at check-in to the Emergency Department and triage nurse.  Should you have questions after your visit or need to cancel or reschedule your appointment, please contact [place] [facility] [facility] - A DEPT OF [facility]  [phone] and follow the prompts.  Office hours are 8:00 a.m. to 4:30 p.m. Monday - Friday. Please note that voicemails left after 4:00 p.m. may not be returned until the following business day.  We are closed weekends and major holidays. You have access to a nurse at all times for urgent questions. Please call the main number to the clinic [phone] and follow the prompts.  For any non-urgent questions, you may also contact your provider using MyChart. We now offer e-Visits for anyone 63 and older to request care online for non-urgent symptoms. For details visit [email or web address].   Also download the MyChart app! Go to the app store, search "MyChart", open the app, select [facility] [place], and log in with your MyChart username and password.

## 2023-01-26 ENCOUNTER — Telehealth: Payer: Self-pay | Admitting: *Deleted

## 2023-01-26 ENCOUNTER — Inpatient Hospital Stay (HOSPITAL_BASED_OUTPATIENT_CLINIC_OR_DEPARTMENT_OTHER): Payer: Medicare Other | Admitting: Oncology

## 2023-01-26 ENCOUNTER — Other Ambulatory Visit: Payer: Self-pay | Admitting: *Deleted

## 2023-01-26 ENCOUNTER — Inpatient Hospital Stay: Payer: Medicare Other

## 2023-01-26 ENCOUNTER — Other Ambulatory Visit: Payer: Self-pay

## 2023-01-26 ENCOUNTER — Encounter: Payer: Self-pay | Admitting: Oncology

## 2023-01-26 VITALS — BP 119/80 | HR 68 | Temp 98.6°F | Wt 178.6 lb

## 2023-01-26 VITALS — BP 130/83 | HR 75 | Temp 97.1°F | Resp 16

## 2023-01-26 DIAGNOSIS — D61818 Other pancytopenia: Secondary | ICD-10-CM

## 2023-01-26 DIAGNOSIS — D704 Cyclic neutropenia: Secondary | ICD-10-CM | POA: Diagnosis not present

## 2023-01-26 DIAGNOSIS — Z7952 Long term (current) use of systemic steroids: Secondary | ICD-10-CM | POA: Diagnosis not present

## 2023-01-26 DIAGNOSIS — Z5181 Encounter for therapeutic drug level monitoring: Secondary | ICD-10-CM

## 2023-01-26 DIAGNOSIS — Z79620 Long term (current) use of immunosuppressive biologic: Secondary | ICD-10-CM | POA: Diagnosis not present

## 2023-01-26 DIAGNOSIS — D693 Immune thrombocytopenic purpura: Secondary | ICD-10-CM | POA: Diagnosis not present

## 2023-01-26 DIAGNOSIS — F79 Unspecified intellectual disabilities: Secondary | ICD-10-CM | POA: Diagnosis not present

## 2023-01-26 DIAGNOSIS — F840 Autistic disorder: Secondary | ICD-10-CM | POA: Diagnosis not present

## 2023-01-26 LAB — CBC WITH DIFFERENTIAL/PLATELET
Abs Immature Granulocytes: 0.01 10*3/uL (ref 0.00–0.07)
Basophils Absolute: 0 10*3/uL (ref 0.0–0.1)
Basophils Relative: 0 %
Eosinophils Absolute: 0 10*3/uL (ref 0.0–0.5)
Eosinophils Relative: 0 %
HCT: 44.4 % (ref 39.0–52.0)
Hemoglobin: 14.9 g/dL (ref 13.0–17.0)
Immature Granulocytes: 0 %
Lymphocytes Relative: 16 %
Lymphs Abs: 0.5 10*3/uL — ABNORMAL LOW (ref 0.7–4.0)
MCH: 28.8 pg (ref 26.0–34.0)
MCHC: 33.6 g/dL (ref 30.0–36.0)
MCV: 85.7 fL (ref 80.0–100.0)
Monocytes Absolute: 0.2 10*3/uL (ref 0.1–1.0)
Monocytes Relative: 7 %
Neutro Abs: 2.5 10*3/uL (ref 1.7–7.7)
Neutrophils Relative %: 77 %
Platelets: 133 10*3/uL — ABNORMAL LOW (ref 150–400)
RBC: 5.18 MIL/uL (ref 4.22–5.81)
RDW: 12.9 % (ref 11.5–15.5)
WBC: 3.2 10*3/uL — ABNORMAL LOW (ref 4.0–10.5)
nRBC: 0 % (ref 0.0–0.2)

## 2023-01-26 LAB — CMP (CANCER CENTER ONLY)
ALT: 23 U/L (ref 0–44)
AST: 20 U/L (ref 15–41)
Albumin: 4.5 g/dL (ref 3.5–5.0)
Alkaline Phosphatase: 59 U/L (ref 38–126)
Anion gap: 8 (ref 5–15)
BUN: 14 mg/dL (ref 6–20)
CO2: 29 mmol/L (ref 22–32)
Calcium: 9.2 mg/dL (ref 8.9–10.3)
Chloride: 100 mmol/L (ref 98–111)
Creatinine: 0.69 mg/dL (ref 0.61–1.24)
GFR, Estimated: >60 mL/min (ref >60)
Glucose, Bld: 103 mg/dL — ABNORMAL HIGH (ref 70–99)
Potassium: 3.6 mmol/L (ref 3.5–5.1)
Sodium: 137 mmol/L (ref 135–145)
Total Bilirubin: 0.8 mg/dL (ref <1.2)
Total Protein: 6.9 g/dL (ref 6.5–8.1)

## 2023-01-26 MED ORDER — SODIUM CHLORIDE 0.9 % IV SOLN
375.0000 mg/m2 | Freq: Once | INTRAVENOUS | Status: DC
Start: 2023-01-26 — End: 2023-01-26

## 2023-01-26 MED ORDER — SODIUM CHLORIDE 0.9 % IV SOLN
INTRAVENOUS | Status: DC
  Filled 2023-01-26: qty 250

## 2023-01-26 MED ORDER — ACETAMINOPHEN 325 MG PO TABS
650.0000 mg | ORAL_TABLET | Freq: Once | ORAL | Status: AC
  Administered 2023-01-26: 650 mg via ORAL
  Filled 2023-01-26: qty 2

## 2023-01-26 MED ORDER — SODIUM CHLORIDE 0.9 % IV SOLN
375.0000 mg/m2 | Freq: Once | INTRAVENOUS | Status: AC
  Administered 2023-01-26: 800 mg via INTRAVENOUS
  Filled 2023-01-26: qty 50

## 2023-01-26 MED ORDER — DIPHENHYDRAMINE HCL 25 MG PO CAPS
50.0000 mg | ORAL_CAPSULE | Freq: Once | ORAL | Status: AC
  Administered 2023-01-26: 50 mg via ORAL
  Filled 2023-01-26: qty 2

## 2023-01-26 NOTE — Progress Notes (Signed)
Hematology/Oncology Consult note [facility]  Telephone:([phone] [phone]  Patient Care Team: Corky Downs, MD as PCP - General (Internal Medicine) Creig Hines, MD as Consulting Physician (Hematology and Oncology)   Name of the patient: Bobby Dean  [id number]  [birth year]   Date of visit: 01/26/23  Diagnosis- autoimmune neutropenia and thrombocytopenia   Chief complaint/ Reason for visit-on treatment assessment prior to cycle 2 of weekly Rituxan  Heme/Onc history:  Patient is a 32 year old male was then referred to [place] for evaluation and management of thrombocytopenia. He has a history of autism and intellectual visibility and currently lives in a group home for the last 7 months. His medical decision maker is [name] from [place] [facility] office.   Blood work from 04/28/2016 showed white count of 2.1 with an H&H of 15.5/45.1 and a platelet count of 51. CMP was within normal limits. TSH was normal at 1.09. At this time I do not have any prior CBCs for comparison.   Bone marrow biopsy on 05/14/2016 showed normal trilineage hematopoiesis with no evidence of lymphoma or leukemia. Peripheral flow cytometry showed reversal of CD4 to CD8 ratio. HIV hepatitis B and hepatitis C testing was negative. B12 and folate were within normal limits. PT/PTT INR was within normal limits.   Review of peripheral smear showed leukopenia and moderate neutropenia. Thrombocytopenia. RBC morphology is normal. Scattered reactive appearing lymphocytes are seen. No blasts or early forms noted.   Monospot testing was positive. ANA was negative. Quantitative immunoglobulins were within normal limits. Serum copper was within normal limits. CMV DNA PCR negative   Patient was started on empiric high dose steroids for possible autoimmune etiology on 05/28/16. His counts normalized. Then his steroid taper was started. His WBC remained normal but his platelets began to  decline 60's.   Plan was to restart full dose steroids and switch to rituxan second line after receiving pre splenectomy vaccines in anticipation of splenectomy should he not respond to rituxan   Patient has received all his pre splenectomy vaccines. He started rituxan on 10/14/16. Seen at [facility] for second opinion by Dr. Anise Salvo who agrees with the plan.   Recurrent neutropenia/ TCP noted in sept 2019. rituxan and steroids restarted on 12/15/17 and counts normalized.  Patient has been requiring Rituxan roughly every 8 to 12 months  Interval history- He is currently on 50 mg of prednisone.  He also received 1 dose of Rituxan last week which she tolerated well without any significant side effects.  Overall feels well today and denies any complaints  ECOG PS- 0 Pain scale- 0   Review of systems- Review of Systems  Constitutional:  Negative for chills, fever, malaise/fatigue and weight loss.  HENT:  Negative for congestion, ear discharge and nosebleeds.   Eyes:  Negative for blurred vision.  Respiratory:  Negative for cough, hemoptysis, sputum production, shortness of breath and wheezing.   Cardiovascular:  Negative for chest pain, palpitations, orthopnea and claudication.  Gastrointestinal:  Negative for abdominal pain, blood in stool, constipation, diarrhea, heartburn, melena, nausea and vomiting.  Genitourinary:  Negative for dysuria, flank pain, frequency, hematuria and urgency.  Musculoskeletal:  Negative for back pain, joint pain and myalgias.  Skin:  Negative for rash.  Neurological:  Negative for dizziness, tingling, focal weakness, seizures, weakness and headaches.  Endo/Heme/Allergies:  Does not bruise/bleed easily.  Psychiatric/Behavioral:  Negative for depression and suicidal ideas. The patient does not have insomnia.  No Known Allergies   Past Medical History:  Diagnosis Date   Anxiety    Autism    Autism    Autoimmune pancytopenia (HCC)      History reviewed. No  pertinent surgical history.  Social History   Socioeconomic History   Marital status: Single    Spouse name: Not on file   Number of children: Not on file   Years of education: Not on file   Highest education level: Not on file  Occupational History   Not on file  Tobacco Use   Smoking status: Never   Smokeless tobacco: Never  Vaping Use   Vaping status: Never Used  Substance and Sexual Activity   Alcohol use: No   Drug use: No   Sexual activity: Not Currently  Other Topics Concern   Not on file  Social History Narrative   Not on file   Social Determinants of Health   Financial Resource Strain: Low Risk  (01/25/2021)   Overall Financial Resource Strain (CARDIA)    Difficulty of Paying Living Expenses: Not hard at all  Food Insecurity: No Food Insecurity (01/25/2021)   Hunger Vital Sign    Worried About Running Out of Food in the Last Year: Never true    Ran Out of Food in the Last Year: Never true  Transportation Needs: No Transportation Needs (01/25/2021)   PRAPARE - Administrator, Civil Service (Medical): No    Lack of Transportation (Non-Medical): No  Physical Activity: Sufficiently Active (01/25/2021)   Exercise Vital Sign    Days of Exercise per Week: 4 days    Minutes of Exercise per Session: 40 min  Stress: No Stress Concern Present (01/25/2021)   [name] of Occupational Health - Occupational Stress Questionnaire    Feeling of Stress : Not at all  Social Connections: Moderately Isolated (01/25/2021)   Social Connection and Isolation Panel [NHANES]    Frequency of Communication with Friends and Family: More than three times a week    Frequency of Social Gatherings with Friends and Family: More than three times a week    Attends Religious Services: Never    Database administrator or Organizations: Yes    Attends Engineer, structural: More than 4 times per year    Marital Status: Never married  Intimate Partner Violence: Not At  Risk (01/25/2021)   Humiliation, Afraid, Rape, and Kick questionnaire    Fear of Current or Ex-Partner: No    Emotionally Abused: No    Physically Abused: No    Sexually Abused: No    Family History  Problem Relation Age of Onset   Cancer Mother      Current Outpatient Medications:    potassium chloride SA (KLOR-CON M) 20 MEQ tablet, TAKE ONE TABLET BY MOUTH EVERY DAY, Disp: 90 tablet, Rfl: 0   predniSONE (DELTASONE) 20 MG tablet, Take 3 tablets (60 mg total) by mouth daily with breakfast., Disp: 42 tablet, Rfl: 0  Physical exam: There were no vitals filed for this visit. Physical Exam Cardiovascular:     Rate and Rhythm: Normal rate and regular rhythm.     Heart sounds: Normal heart sounds.  Pulmonary:     Effort: Pulmonary effort is normal.     Breath sounds: Normal breath sounds.  Skin:    General: Skin is warm and dry.  Neurological:     Mental Status: He is alert and oriented to person, place, and time.  Latest Ref Rng & Units 06/02/2022   12:11 PM  CMP  Glucose 65 - 99 mg/dL 73   BUN 7 - 25 mg/dL 11   Creatinine mg/dL 4.33   Sodium 295 - 188 mmol/L 139   Potassium 3.5 - 5.3 mmol/L 4.3   Chloride 98 - 110 mmol/L 102   CO2 20 - 32 mmol/L 25   Calcium mg/dL 9.9       Latest Ref Rng & Units 01/12/2023   11:42 AM  CBC  WBC 4.0 - 10.5 K/uL 2.0   Hemoglobin 13.0 - 17.0 g/dL 41.6   Hematocrit 60.6 - 52.0 % 41.6   Platelets 150 - 400 K/uL 84     No images are attached to the encounter.  No results found.   Assessment and plan- Patient is a 32 y.o. male with autoimmune pancytopenia here for on treatment assessment prior to cycle 2 of weekly Rituxan  Counts okay to proceed with cycle 2 of weekly Rituxan today.  CBC with differential and CMP from today are pending.  He will directly proceed for cycle 3 next week and I will see him back in 2 weeks for cycle 4 which will be his last cycle.  He is also on 50 mg of prednisone today and I will plan to taper  data over the next 3 to 4 weeks based on his counts today.  If he does not respond to Rituxan we will consider getting a repeat bone marrow biopsy at that time. Visit Diagnosis 1. Autoimmune pancytopenia (HCC)      Dr. Owens Shark, MD, MPH [facility] [phone] 01/26/2023 9:19 AM

## 2023-01-26 NOTE — Patient Instructions (Signed)
[place] [facility] - A DEPT OF [facility]  Discharge Instructions: Thank you for choosing [facility] to provide your oncology and hematology care.  If you have a lab appointment with the Cancer Center, please go directly to the Cancer Center and check in at the registration area.  We strive to give you quality time with your provider. You may need to reschedule your appointment if you arrive late (15 or more minutes).  Arriving late affects you and other patients whose appointments are after yours.  Also, if you miss three or more appointments without notifying the office, you may be dismissed from the clinic at the provider's discretion.      For prescription refill requests, have your pharmacy contact our office and allow 72 hours for refills to be completed.    To help prevent nausea and vomiting after your treatment, we encourage you to take your nausea medication as directed.  BELOW ARE SYMPTOMS THAT SHOULD BE REPORTED IMMEDIATELY: *FEVER GREATER THAN 100.4 F (38 C) OR HIGHER *CHILLS OR SWEATING *NAUSEA AND VOMITING THAT IS NOT CONTROLLED WITH YOUR NAUSEA MEDICATION *UNUSUAL SHORTNESS OF BREATH *UNUSUAL BRUISING OR BLEEDING *URINARY PROBLEMS (pain or burning when urinating, or frequent urination) *BOWEL PROBLEMS (unusual diarrhea, constipation, pain near the anus) TENDERNESS IN MOUTH AND THROAT WITH OR WITHOUT PRESENCE OF ULCERS (sore throat, sores in mouth, or a toothache) UNUSUAL RASH, SWELLING OR PAIN   Items with * indicate a potential emergency and should be followed up as soon as possible or go to the Emergency Department if any problems should occur.  Should you have questions after your visit or need to cancel or reschedule your appointment, please contact [place] [facility] - A DEPT OF [facility]  [phone] and follow the prompts.  Office hours are 8:00 a.m. to 4:30 p.m. Monday - Friday. Please note that  voicemails left after 4:00 p.m. may not be returned until the following business day.  We are closed weekends and major holidays. You have access to a nurse at all times for urgent questions. Please call the main number to the clinic [phone] and follow the prompts.  For any non-urgent questions, you may also contact your provider using MyChart. We now offer e-Visits for anyone 41 and older to request care online for non-urgent symptoms. For details visit [email or web address].   Also download the MyChart app! Go to the app store, search "MyChart", open the app, select [place], and log in with your MyChart username and password.

## 2023-01-26 NOTE — Telephone Encounter (Signed)
We need to change appts, they were not correct when pt and guest was here. I called and left message that the appts for next week. Next appt 11/20 with labs 8 am and then treatment.  Next after: 11/27 at 8:30 labs and then see Smith Robert and then treatment.  Let me know if you need anything

## 2023-01-26 NOTE — Progress Notes (Signed)
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 This patient may be considered for RIR.   A pharmacist has verified the patient tolerated rituximab infusions per the [facility] standard infusion protocol without grade 3-4 infusion reactions. The treatment plan will be updated to reflect RIR if the patient qualifies per the checklist below:   Age > 90 years old Yes   Clinically significant cardiovascular disease No   Circulating lymphocyte count < 5000/uL prior to cycle two Yes  Lab Results  Component Value Date   LYMPHSABS 1.1 01/12/2023    Prior documented grade 3-4 infusion reaction to rituximab No   Prior documented grade 1-2 infusion reaction to rituximab (If YES, Pharmacist will confirm with Physician if patient is still a candidate for RIR) No   Previous rituximab infusion within the past 6 months Yes   Treatment Plan updated orders to reflect RIR Yes    Bobby Dean does meet the criteria for Rapid Infusion Rituximab. This patient is going to be switched to rapid infusion rituximab.   Sharen Hones, PharmD, BCPS Clinical Pharmacist   01/26/23 9:37 AM

## 2023-01-26 NOTE — Addendum Note (Signed)
Addended by: Owens Shark C on: 01/26/2023 12:32 PM   Modules accepted: Orders

## 2023-01-26 NOTE — Progress Notes (Deleted)
Rapid Infusion Rituximab Pharmacist Evaluation  Bobby Dean is a 32 y.o. male being treated with rituximab for ***. This patient {Actions; may/not:14603} be considered for RIR.   A pharmacist has verified the patient tolerated rituximab infusions per the [facility] standard infusion protocol without grade 3-4 infusion reactions. The treatment plan will be updated to reflect RIR if the patient qualifies per the checklist below:   Age > 28 years old {YES/NO:21197}  Clinically significant cardiovascular disease {YES/NO:21197}  Circulating lymphocyte count < 5000/uL prior to cycle two {YES/NO:21197} Lab Results  Component Value Date   LYMPHSABS 1.1 01/12/2023    Prior documented grade 3-4 infusion reaction to rituximab {YES/NO:21197}  Prior documented grade 1-2 infusion reaction to rituximab (If YES, Pharmacist will confirm with Physician if patient is still a candidate for RIR) {YES/NO:21197}  Previous rituximab infusion within the past 6 months {YES/NO:21197}  Treatment Plan updated orders to reflect RIR {YES/NO:21197}   [name] {DOES/DOES NOT:200015} meet the criteria for Rapid Infusion Rituximab. This patient {ACTION; IS/IS [id number]} going to be switched to rapid infusion rituximab.   Ebony Hail P 01/26/23 9:33 AM

## 2023-02-02 ENCOUNTER — Inpatient Hospital Stay: Payer: Medicare Other

## 2023-02-02 ENCOUNTER — Other Ambulatory Visit: Payer: Medicare Other

## 2023-02-02 ENCOUNTER — Ambulatory Visit: Payer: Medicare Other | Admitting: Oncology

## 2023-02-02 ENCOUNTER — Other Ambulatory Visit: Payer: Self-pay | Admitting: *Deleted

## 2023-02-02 VITALS — BP 132/82 | HR 98 | Temp 96.8°F | Resp 18 | Ht 74.0 in | Wt 177.5 lb

## 2023-02-02 DIAGNOSIS — D61818 Other pancytopenia: Secondary | ICD-10-CM

## 2023-02-02 DIAGNOSIS — D693 Immune thrombocytopenic purpura: Secondary | ICD-10-CM | POA: Diagnosis not present

## 2023-02-02 DIAGNOSIS — F840 Autistic disorder: Secondary | ICD-10-CM | POA: Diagnosis not present

## 2023-02-02 DIAGNOSIS — D704 Cyclic neutropenia: Secondary | ICD-10-CM | POA: Diagnosis not present

## 2023-02-02 DIAGNOSIS — F79 Unspecified intellectual disabilities: Secondary | ICD-10-CM | POA: Diagnosis not present

## 2023-02-02 DIAGNOSIS — Z7952 Long term (current) use of systemic steroids: Secondary | ICD-10-CM | POA: Diagnosis not present

## 2023-02-02 LAB — CBC WITH DIFFERENTIAL/PLATELET
Abs Immature Granulocytes: 0.01 10*3/uL (ref 0.00–0.07)
Basophils Absolute: 0 10*3/uL (ref 0.0–0.1)
Basophils Relative: 0 %
Eosinophils Absolute: 0.1 10*3/uL (ref 0.0–0.5)
Eosinophils Relative: 1 %
HCT: 46.7 % (ref 39.0–52.0)
Hemoglobin: 15.4 g/dL (ref 13.0–17.0)
Immature Granulocytes: 0 %
Lymphocytes Relative: 25 %
Lymphs Abs: 1.4 10*3/uL (ref 0.7–4.0)
MCH: 28.8 pg (ref 26.0–34.0)
MCHC: 33 g/dL (ref 30.0–36.0)
MCV: 87.5 fL (ref 80.0–100.0)
Monocytes Absolute: 0.5 10*3/uL (ref 0.1–1.0)
Monocytes Relative: 9 %
Neutro Abs: 3.6 10*3/uL (ref 1.7–7.7)
Neutrophils Relative %: 65 %
Platelets: 125 10*3/uL — ABNORMAL LOW (ref 150–400)
RBC: 5.34 MIL/uL (ref 4.22–5.81)
RDW: 13.3 % (ref 11.5–15.5)
WBC: 5.6 10*3/uL (ref 4.0–10.5)
nRBC: 0 % (ref 0.0–0.2)

## 2023-02-02 MED ORDER — ACETAMINOPHEN 325 MG PO TABS
650.0000 mg | ORAL_TABLET | Freq: Once | ORAL | Status: AC
  Administered 2023-02-02: 650 mg via ORAL
  Filled 2023-02-02: qty 2

## 2023-02-02 MED ORDER — SODIUM CHLORIDE 0.9 % IV SOLN
INTRAVENOUS | Status: DC
  Filled 2023-02-02: qty 250

## 2023-02-02 MED ORDER — DIPHENHYDRAMINE HCL 25 MG PO CAPS
50.0000 mg | ORAL_CAPSULE | Freq: Once | ORAL | Status: AC
Start: 2023-02-02 — End: 2023-02-02
  Administered 2023-02-02: 50 mg via ORAL
  Filled 2023-02-02: qty 2

## 2023-02-02 MED ORDER — PREDNISONE 10 MG PO TABS: ORAL_TABLET | ORAL | 0 refills | Status: DC

## 2023-02-02 MED ORDER — SODIUM CHLORIDE 0.9 % IV SOLN
375.0000 mg/m2 | Freq: Once | INTRAVENOUS | Status: AC
  Administered 2023-02-02: 800 mg via INTRAVENOUS
  Filled 2023-02-02: qty 50

## 2023-02-02 NOTE — Patient Instructions (Signed)
 [place] [facility]  Discharge Instructions: Thank you for choosing [facility] to provide your oncology and hematology care.  If you have a lab appointment with the Cancer Center, please go directly to the Cancer Center and check in at the registration area.  Wear comfortable clothing and clothing appropriate for easy access to any Portacath or PICC line.   We strive to give you quality time with your provider. You may need to reschedule your appointment if you arrive late (15 or more minutes).  Arriving late affects you and other patients whose appointments are after yours.  Also, if you miss three or more appointments without notifying the office, you may be dismissed from the clinic at the provider's discretion.      For prescription refill requests, have your pharmacy contact our office and allow 72 hours for refills to be completed.    Today you received the following chemotherapy and/or immunotherapy agents RITUXAN      To help prevent nausea and vomiting after your treatment, we encourage you to take your nausea medication as directed.  BELOW ARE SYMPTOMS THAT SHOULD BE REPORTED IMMEDIATELY: *FEVER GREATER THAN 100.4 F (38 C) OR HIGHER *CHILLS OR SWEATING *NAUSEA AND VOMITING THAT IS NOT CONTROLLED WITH YOUR NAUSEA MEDICATION *UNUSUAL SHORTNESS OF BREATH *UNUSUAL BRUISING OR BLEEDING *URINARY PROBLEMS (pain or burning when urinating, or frequent urination) *BOWEL PROBLEMS (unusual diarrhea, constipation, pain near the anus) TENDERNESS IN MOUTH AND THROAT WITH OR WITHOUT PRESENCE OF ULCERS (sore throat, sores in mouth, or a toothache) UNUSUAL RASH, SWELLING OR PAIN  UNUSUAL VAGINAL DISCHARGE OR ITCHING   Items with * indicate a potential emergency and should be followed up as soon as possible or go to the Emergency Department if any problems should occur.  Please show the CHEMOTHERAPY ALERT CARD or IMMUNOTHERAPY ALERT  CARD at check-in to the Emergency Department and triage nurse.  Should you have questions after your visit or need to cancel or reschedule your appointment, please contact [place] [facility] - A DEPT OF [facility]  [phone] and follow the prompts.  Office hours are 8:00 a.m. to 4:30 p.m. Monday - Friday. Please note that voicemails left after 4:00 p.m. may not be returned until the following business day.  We are closed weekends and major holidays. You have access to a nurse at all times for urgent questions. Please call the main number to the clinic [phone] and follow the prompts.  For any non-urgent questions, you may also contact your provider using MyChart. We now offer e-Visits for anyone 85 and older to request care online for non-urgent symptoms. For details visit [email or web address].   Also download the MyChart app! Go to the app store, search "MyChart", open the app, select [place], and log in with your MyChart username and password.  Rituximab Injection What is this medication? RITUXIMAB (ri TUX i mab) treats leukemia and lymphoma. It works by blocking a protein that causes cancer cells to grow and multiply. This helps to slow or stop the spread of cancer cells. It may also be used to treat autoimmune conditions, such as arthritis. It works by slowing down an overactive immune system. It is a monoclonal antibody. This medicine may be used for other purposes; ask your health care provider or pharmacist if you have questions. COMMON BRAND NAME(S): RIABNI, Rituxan, RUXIENCE, truxima What should I tell my care team before I take  this medication? They need to know if you have any of these conditions: Chest pain Heart disease Immune system problems Infection, such as chickenpox, cold sores, hepatitis B, herpes Irregular heartbeat or rhythm Kidney disease Low blood counts, such as low white cells, platelets, red cells Lung disease Recent or  upcoming vaccine An unusual or allergic reaction to rituximab, other medications, foods, dyes, or preservatives Pregnant or trying to get pregnant Breast-feeding How should I use this medication? This medication is injected into a vein. It is given by a care team in a hospital or clinic setting. A special MedGuide will be given to you before each treatment. Be sure to read this information carefully each time. Talk to your care team about the use of this medication in children. While this medication may be prescribed for children as young as 6 months for selected conditions, precautions do apply. Overdosage: If you think you have taken too much of this medicine contact a poison control center or emergency room at once. NOTE: This medicine is only for you. Do not share this medicine with others. What if I miss a dose? Keep appointments for follow-up doses. It is important not to miss your dose. Call your care team if you are unable to keep an appointment. What may interact with this medication? Do not take this medication with any of the following: Live vaccines This medication may also interact with the following: Cisplatin This list may not describe all possible interactions. Give your health care provider a list of all the medicines, herbs, non-prescription drugs, or dietary supplements you use. Also tell them if you smoke, drink alcohol, or use illegal drugs. Some items may interact with your medicine. What should I watch for while using this medication? Your condition will be monitored carefully while you are receiving this medication. You may need blood work while taking this medication. This medication can cause serious infusion reactions. To reduce the risk your care team may give you other medications to take before receiving this one. Be sure to follow the directions from your care team. This medication may increase your risk of getting an infection. Call your care team for advice if  you get a fever, chills, sore throat, or other symptoms of a cold or flu. Do not treat yourself. Try to avoid being around people who are sick. Call your care team if you are around anyone with measles, chickenpox, or if you develop sores or blisters that do not heal properly. Avoid taking medications that contain aspirin, acetaminophen, ibuprofen, naproxen, or ketoprofen unless instructed by your care team. These medications may hide a fever. This medication may cause serious skin reactions. They can happen weeks to months after starting the medication. Contact your care team right away if you notice fevers or flu-like symptoms with a rash. The rash may be red or purple and then turn into blisters or peeling of the skin. You may also notice a red rash with swelling of the face, lips, or lymph nodes in your neck or under your arms. In some patients, this medication may cause a serious brain infection that may cause death. If you have any problems seeing, thinking, speaking, walking, or standing, tell your care team right away. If you cannot reach your care team, urgently seek another source of medical care. Talk to your care team if you may be pregnant. Serious birth defects can occur if you take this medication during pregnancy and for 12 months after the last dose. You will  need a negative pregnancy test before starting this medication. Contraception is recommended while taking this medication and for 12 months after the last dose. Your care team can help you find the option that works for you. Do not breastfeed while taking this medication and for at least 6 months after the last dose. What side effects may I notice from receiving this medication? Side effects that you should report to your care team as soon as possible: Allergic reactions or angioedema--skin rash, itching or hives, swelling of the face, eyes, lips, tongue, arms, or legs, trouble swallowing or breathing Bowel blockage--stomach cramping,  unable to have a bowel movement or pass gas, loss of appetite, vomiting Dizziness, loss of balance or coordination, confusion or trouble speaking Heart attack--pain or tightness in the chest, shoulders, arms, or jaw, nausea, shortness of breath, cold or clammy skin, feeling faint or lightheaded Heart rhythm changes--fast or irregular heartbeat, dizziness, feeling faint or lightheaded, chest pain, trouble breathing Infection--fever, chills, cough, sore throat, wounds that don't heal, pain or trouble when passing urine, general feeling of discomfort or being unwell Infusion reactions--chest pain, shortness of breath or trouble breathing, feeling faint or lightheaded Kidney injury--decrease in the amount of urine, swelling of the ankles, hands, or feet Liver injury--right upper belly pain, loss of appetite, nausea, light-colored stool, dark yellow or brown urine, yellowing skin or eyes, unusual weakness or fatigue Redness, blistering, peeling, or loosening of the skin, including inside the mouth Stomach pain that is severe, does not go away, or gets worse Tumor lysis syndrome (TLS)--nausea, vomiting, diarrhea, decrease in the amount of urine, dark urine, unusual weakness or fatigue, confusion, muscle pain or cramps, fast or irregular heartbeat, joint pain Side effects that usually do not require medical attention (report to your care team if they continue or are bothersome): Headache Joint pain Nausea Runny or stuffy nose Unusual weakness or fatigue This list may not describe all possible side effects. Call your doctor for medical advice about side effects. You may report side effects to FDA at 1-800-FDA-1088. Where should I keep my medication? This medication is given in a hospital or clinic. It will not be stored at home. NOTE: This sheet is a summary. It may not cover all possible information. If you have questions about this medicine, talk to your doctor, pharmacist, or health care provider.   2024 Elsevier/Gold Standard (2021-07-23 00:00:00)

## 2023-02-09 ENCOUNTER — Inpatient Hospital Stay: Payer: Medicare Other

## 2023-02-09 ENCOUNTER — Inpatient Hospital Stay (HOSPITAL_BASED_OUTPATIENT_CLINIC_OR_DEPARTMENT_OTHER): Payer: Medicare Other | Admitting: Oncology

## 2023-02-09 ENCOUNTER — Ambulatory Visit: Payer: Medicare Other

## 2023-02-09 ENCOUNTER — Encounter: Payer: Self-pay | Admitting: Oncology

## 2023-02-09 VITALS — BP 145/87 | HR 91 | Temp 97.0°F | Resp 19

## 2023-02-09 VITALS — BP 127/92 | HR 76 | Temp 97.3°F | Resp 18 | Wt 175.2 lb

## 2023-02-09 DIAGNOSIS — Z79620 Long term (current) use of immunosuppressive biologic: Secondary | ICD-10-CM

## 2023-02-09 DIAGNOSIS — Z5181 Encounter for therapeutic drug level monitoring: Secondary | ICD-10-CM

## 2023-02-09 DIAGNOSIS — D61818 Other pancytopenia: Secondary | ICD-10-CM | POA: Diagnosis not present

## 2023-02-09 DIAGNOSIS — F79 Unspecified intellectual disabilities: Secondary | ICD-10-CM | POA: Diagnosis not present

## 2023-02-09 DIAGNOSIS — Z7952 Long term (current) use of systemic steroids: Secondary | ICD-10-CM | POA: Diagnosis not present

## 2023-02-09 DIAGNOSIS — D704 Cyclic neutropenia: Secondary | ICD-10-CM | POA: Diagnosis not present

## 2023-02-09 DIAGNOSIS — F840 Autistic disorder: Secondary | ICD-10-CM | POA: Diagnosis not present

## 2023-02-09 DIAGNOSIS — D693 Immune thrombocytopenic purpura: Secondary | ICD-10-CM | POA: Diagnosis not present

## 2023-02-09 LAB — CBC WITH DIFFERENTIAL/PLATELET
Abs Immature Granulocytes: 0.03 10*3/uL (ref 0.00–0.07)
Basophils Absolute: 0 10*3/uL (ref 0.0–0.1)
Basophils Relative: 1 %
Eosinophils Absolute: 0.1 10*3/uL (ref 0.0–0.5)
Eosinophils Relative: 1 %
HCT: 45.9 % (ref 39.0–52.0)
Hemoglobin: 15.3 g/dL (ref 13.0–17.0)
Immature Granulocytes: 1 %
Lymphocytes Relative: 28 %
Lymphs Abs: 1.7 10*3/uL (ref 0.7–4.0)
MCH: 29.3 pg (ref 26.0–34.0)
MCHC: 33.3 g/dL (ref 30.0–36.0)
MCV: 87.8 fL (ref 80.0–100.0)
Monocytes Absolute: 0.6 10*3/uL (ref 0.1–1.0)
Monocytes Relative: 9 %
Neutro Abs: 3.8 10*3/uL (ref 1.7–7.7)
Neutrophils Relative %: 60 %
Platelets: 160 10*3/uL (ref 150–400)
RBC: 5.23 MIL/uL (ref 4.22–5.81)
RDW: 13.2 % (ref 11.5–15.5)
WBC: 6.2 10*3/uL (ref 4.0–10.5)
nRBC: 0 % (ref 0.0–0.2)

## 2023-02-09 LAB — CMP (CANCER CENTER ONLY)
ALT: 16 U/L (ref 0–44)
AST: 15 U/L (ref 15–41)
Albumin: 4.2 g/dL (ref 3.5–5.0)
Alkaline Phosphatase: 50 U/L (ref 38–126)
Anion gap: 10 (ref 5–15)
BUN: 22 mg/dL — ABNORMAL HIGH (ref 6–20)
CO2: 28 mmol/L (ref 22–32)
Calcium: 9.1 mg/dL (ref 8.9–10.3)
Chloride: 102 mmol/L (ref 98–111)
Creatinine: 0.77 mg/dL (ref 0.61–1.24)
GFR, Estimated: >60 mL/min (ref >60)
Glucose, Bld: 87 mg/dL (ref 70–99)
Potassium: 3.6 mmol/L (ref 3.5–5.1)
Sodium: 140 mmol/L (ref 135–145)
Total Bilirubin: 1.1 mg/dL (ref <1.2)
Total Protein: 6.9 g/dL (ref 6.5–8.1)

## 2023-02-09 MED ORDER — ACETAMINOPHEN 325 MG PO TABS
650.0000 mg | ORAL_TABLET | Freq: Once | ORAL | Status: AC
  Administered 2023-02-09: 650 mg via ORAL
  Filled 2023-02-09: qty 2

## 2023-02-09 MED ORDER — DIPHENHYDRAMINE HCL 25 MG PO CAPS
50.0000 mg | ORAL_CAPSULE | Freq: Once | ORAL | Status: AC
  Administered 2023-02-09: 50 mg via ORAL
  Filled 2023-02-09: qty 2

## 2023-02-09 MED ORDER — SODIUM CHLORIDE 0.9 % IV SOLN
INTRAVENOUS | Status: DC
  Filled 2023-02-09: qty 250

## 2023-02-09 MED ORDER — SODIUM CHLORIDE 0.9 % IV SOLN
375.0000 mg/m2 | Freq: Once | INTRAVENOUS | Status: AC
  Administered 2023-02-09: 800 mg via INTRAVENOUS
  Filled 2023-02-09: qty 50

## 2023-02-09 NOTE — Patient Instructions (Signed)
[facility] - A DEPT OF [facility]  Discharge Instructions: Thank you for choosing [facility] to provide your oncology and hematology care.  If you have a lab appointment with the Cancer Center, please go directly to the Cancer Center and check in at the registration area.  Wear comfortable clothing and clothing appropriate for easy access to any Portacath or PICC line.   We strive to give you quality time with your provider. You may need to reschedule your appointment if you arrive late (15 or more minutes).  Arriving late affects you and other patients whose appointments are after yours.  Also, if you miss three or more appointments without notifying the office, you may be dismissed from the clinic at the provider's discretion.      For prescription refill requests, have your pharmacy contact our office and allow 72 hours for refills to be completed.    Today you received the following chemotherapy and/or immunotherapy agents rituxan      To help prevent nausea and vomiting after your treatment, we encourage you to take your nausea medication as directed.  BELOW ARE SYMPTOMS THAT SHOULD BE REPORTED IMMEDIATELY: *FEVER GREATER THAN 100.4 F (38 C) OR HIGHER *CHILLS OR SWEATING *NAUSEA AND VOMITING THAT IS NOT CONTROLLED WITH YOUR NAUSEA MEDICATION *UNUSUAL SHORTNESS OF BREATH *UNUSUAL BRUISING OR BLEEDING *URINARY PROBLEMS (pain or burning when urinating, or frequent urination) *BOWEL PROBLEMS (unusual diarrhea, constipation, pain near the anus) TENDERNESS IN MOUTH AND THROAT WITH OR WITHOUT PRESENCE OF ULCERS (sore throat, sores in mouth, or a toothache) UNUSUAL RASH, SWELLING OR PAIN  UNUSUAL VAGINAL DISCHARGE OR ITCHING   Items with * indicate a potential emergency and should be followed up as soon as possible or go to the Emergency Department if any problems should occur.  Please show the CHEMOTHERAPY ALERT CARD or IMMUNOTHERAPY ALERT  CARD at check-in to the Emergency Department and triage nurse.  Should you have questions after your visit or need to cancel or reschedule your appointment, please contact [facility] - A DEPT OF [facility]  [phone] and follow the prompts.  Office hours are 8:00 a.m. to 4:30 p.m. Monday - Friday. Please note that voicemails left after 4:00 p.m. may not be returned until the following business day.  We are closed weekends and major holidays. You have access to a nurse at all times for urgent questions. Please call the main number to the clinic [phone] and follow the prompts.  For any non-urgent questions, you may also contact your provider using MyChart. We now offer e-Visits for anyone 51 and older to request care online for non-urgent symptoms. For details visit [email or web address].   Also download the MyChart app! Go to the app store, search "MyChart", open the app, select [place], and log in with your MyChart username and password.

## 2023-02-09 NOTE — Progress Notes (Signed)
Hematology/Oncology Consult note [facility]  Telephone:([phone] [phone]  Patient Care Team: Corky Downs, MD as PCP - General (Internal Medicine) Creig Hines, MD as Consulting Physician (Hematology and Oncology)   Name of the patient: Bobby Dean  [id number]  [birth year]   Date of visit: 02/09/23  Diagnosis- autoimmune neutropenia and thrombocytopenia   Chief complaint/ Reason for visit-on treatment assessment prior to cycle 4 of weekly Rituxan  Heme/Onc history:  Patient is a 32 year old male was then referred to [place] for evaluation and management of thrombocytopenia. He has a history of autism and intellectual visibility and currently lives in a group home for the last 7 months. His medical decision maker is [name] from [place] DSS office.   Blood work from 04/28/2016 showed white count of 2.1 with an H&H of 15.5/45.1 and a platelet count of 51. CMP was within normal limits. TSH was normal at 1.09. At this time I do not have any prior CBCs for comparison.   Bone marrow biopsy on 05/14/2016 showed normal trilineage hematopoiesis with no evidence of lymphoma or leukemia. Peripheral flow cytometry showed reversal of CD4 to CD8 ratio. HIV hepatitis B and hepatitis C testing was negative. B12 and folate were within normal limits. PT/PTT INR was within normal limits.   Review of peripheral smear showed leukopenia and moderate neutropenia. Thrombocytopenia. RBC morphology is normal. Scattered reactive appearing lymphocytes are seen. No blasts or early forms noted.   Monospot testing was positive. ANA was negative. Quantitative immunoglobulins were within normal limits. Serum copper was within normal limits. CMV DNA PCR negative   Patient was started on empiric high dose steroids for possible autoimmune etiology on 05/28/16. His counts normalized. Then his steroid taper was started. His WBC remained normal but his platelets began to  decline 60's.   Plan was to restart full dose steroids and switch to rituxan second line after receiving pre splenectomy vaccines in anticipation of splenectomy should he not respond to rituxan   Patient has received all his pre splenectomy vaccines. He started rituxan on 10/14/16. Seen at [facility] for second opinion by Dr. Anise Salvo who agrees with the plan.   Recurrent neutropenia/thrombocytopenia noted in sept 2019. rituxan and steroids restarted on 12/15/17 and counts normalized.  Patient has been requiring Rituxan roughly every 8 to 12 months    Interval history-he is doing well presently and tolerating Rituxan without any significant side effects.  He is on a steroid taper which she will complete in the next 2 weeks.  Patient has always lost some weight on steroids and presently his weight is down by 3 pounds as compared to 2 weeks ago  ECOG PS- 0 Pain scale- 0   Review of systems- Review of Systems  Constitutional:  Negative for chills, fever, malaise/fatigue and weight loss.  HENT:  Negative for congestion, ear discharge and nosebleeds.   Eyes:  Negative for blurred vision.  Respiratory:  Negative for cough, hemoptysis, sputum production, shortness of breath and wheezing.   Cardiovascular:  Negative for chest pain, palpitations, orthopnea and claudication.  Gastrointestinal:  Negative for abdominal pain, blood in stool, constipation, diarrhea, heartburn, melena, nausea and vomiting.  Genitourinary:  Negative for dysuria, flank pain, frequency, hematuria and urgency.  Musculoskeletal:  Negative for back pain, joint pain and myalgias.  Skin:  Negative for rash.  Neurological:  Negative for dizziness, tingling, focal weakness, seizures, weakness and headaches.  Endo/Heme/Allergies:  Does not bruise/bleed easily.  Psychiatric/Behavioral:  Negative  for depression and suicidal ideas. The patient does not have insomnia.       No Known Allergies   Past Medical History:  Diagnosis Date    Anxiety    Autism    Autism    Autoimmune pancytopenia (HCC)      History reviewed. No pertinent surgical history.  Social History   Socioeconomic History   Marital status: Single    Spouse name: Not on file   Number of children: Not on file   Years of education: Not on file   Highest education level: Not on file  Occupational History   Not on file  Tobacco Use   Smoking status: Never   Smokeless tobacco: Never  Vaping Use   Vaping status: Never Used  Substance and Sexual Activity   Alcohol use: No   Drug use: No   Sexual activity: Not Currently  Other Topics Concern   Not on file  Social History Narrative   Not on file   Social Determinants of Health   Financial Resource Strain: Low Risk  (01/25/2021)   Overall Financial Resource Strain (CARDIA)    Difficulty of Paying Living Expenses: Not hard at all  Food Insecurity: No Food Insecurity (01/25/2021)   Hunger Vital Sign    Worried About Running Out of Food in the Last Year: Never true    Ran Out of Food in the Last Year: Never true  Transportation Needs: No Transportation Needs (01/25/2021)   PRAPARE - Administrator, Civil Service (Medical): No    Lack of Transportation (Non-Medical): No  Physical Activity: Sufficiently Active (01/25/2021)   Exercise Vital Sign    Days of Exercise per Week: 4 days    Minutes of Exercise per Session: 40 min  Stress: No Stress Concern Present (01/25/2021)   [name] of Occupational Health - Occupational Stress Questionnaire    Feeling of Stress : Not at all  Social Connections: Moderately Isolated (01/25/2021)   Social Connection and Isolation Panel [NHANES]    Frequency of Communication with Friends and Family: More than three times a week    Frequency of Social Gatherings with Friends and Family: More than three times a week    Attends Religious Services: Never    Database administrator or Organizations: Yes    Attends Engineer, structural:  More than 4 times per year    Marital Status: Never married  Intimate Partner Violence: Not At Risk (01/25/2021)   Humiliation, Afraid, Rape, and Kick questionnaire    Fear of Current or Ex-Partner: No    Emotionally Abused: No    Physically Abused: No    Sexually Abused: No    Family History  Problem Relation Age of Onset   Cancer Mother      Current Outpatient Medications:    potassium chloride SA (KLOR-CON M) 20 MEQ tablet, TAKE ONE TABLET BY MOUTH EVERY DAY, Disp: 90 tablet, Rfl: 0   predniSONE (DELTASONE) 10 MG tablet, Use 10 mg tablets , start 5 tablets daily for 3 days, then 4 tablets daily for 3 days, then 3 tablets daily for 3 days, then  2 tablets daily for 3 day, 1 tablet daily for 3 days then stop, Disp: 45 tablet, Rfl: 0  Physical exam:  Vitals:   02/09/23 0838  BP: (!) 127/92  Pulse: 76  Resp: 18  Temp: (!) 97.3 F (36.3 C)  TempSrc: Tympanic  SpO2: 100%  Weight: 175 lb 3.2 oz (79.5 kg)  Physical Exam Cardiovascular:     Rate and Rhythm: Normal rate and regular rhythm.     Heart sounds: Normal heart sounds.  Pulmonary:     Effort: Pulmonary effort is normal.  Skin:    General: Skin is warm and dry.  Neurological:     Mental Status: He is alert and oriented to person, place, and time.         Latest Ref Rng & Units 02/09/2023    8:21 AM  CMP  Glucose 70 - 99 mg/dL 87   BUN 6 - 20 mg/dL 22   Creatinine 8.29 - 1.24 mg/dL 5.62   Sodium 130 - 865 mmol/L 140   Potassium 3.5 - 5.1 mmol/L 3.6   Chloride 98 - 111 mmol/L 102   CO2 22 - 32 mmol/L 28   Calcium 8.9 - 10.3 mg/dL 9.1   Total Protein 6.5 - 8.1 g/dL 6.9   Total Bilirubin <7.8 mg/dL 1.1   Alkaline Phos 38 - 126 U/L 50   AST 15 - 41 U/L 15   ALT 0 - 44 U/L 16       Latest Ref Rng & Units 02/09/2023    8:21 AM  CBC  WBC 4.0 - 10.5 K/uL 6.2   Hemoglobin 13.0 - 17.0 g/dL 46.9   Hematocrit 62.9 - 52.0 % 45.9   Platelets 150 - 400 K/uL 160      Assessment and plan- Patient is a 32 y.o.  male with history of autoimmune pancytopenia here for on treatment assessment prior to cycle 4 of weekly Rituxan  Counts okay to proceed with cycle 4 of weekly Rituxan today which would be his last cycle.  He is also on a steroid taper which she will complete over the next 2 weeks.  His white count has improved from 2-6.2 and his neutrophil counts are normal.  Thrombocytopenia is also resolved and hemoglobin is normal.  CBC with differential in 1 month and 2 months and I will see him back in 2 months   Visit Diagnosis 1. Encounter for monitoring rituximab therapy   2. Autoimmune pancytopenia (HCC)      Dr. Owens Shark, MD, MPH [facility] [phone] 02/09/2023 8:58 AM

## 2023-02-28 DIAGNOSIS — F840 Autistic disorder: Secondary | ICD-10-CM | POA: Diagnosis not present

## 2023-02-28 DIAGNOSIS — R634 Abnormal weight loss: Secondary | ICD-10-CM | POA: Diagnosis not present

## 2023-02-28 DIAGNOSIS — D696 Thrombocytopenia, unspecified: Secondary | ICD-10-CM | POA: Diagnosis not present

## 2023-03-03 ENCOUNTER — Inpatient Hospital Stay: Payer: Medicare Other | Attending: Oncology

## 2023-03-03 ENCOUNTER — Other Ambulatory Visit: Payer: Medicare Other

## 2023-03-03 DIAGNOSIS — Z5181 Encounter for therapeutic drug level monitoring: Secondary | ICD-10-CM

## 2023-03-03 DIAGNOSIS — D693 Immune thrombocytopenic purpura: Secondary | ICD-10-CM | POA: Diagnosis not present

## 2023-03-03 DIAGNOSIS — D61818 Other pancytopenia: Secondary | ICD-10-CM

## 2023-03-03 LAB — CBC WITH DIFFERENTIAL (CANCER CENTER ONLY)
Abs Immature Granulocytes: 0 10*3/uL (ref 0.00–0.07)
Basophils Absolute: 0 10*3/uL (ref 0.0–0.1)
Basophils Relative: 0 %
Eosinophils Absolute: 0.1 10*3/uL (ref 0.0–0.5)
Eosinophils Relative: 2 %
HCT: 43.5 % (ref 39.0–52.0)
Hemoglobin: 14.4 g/dL (ref 13.0–17.0)
Immature Granulocytes: 0 %
Lymphocytes Relative: 33 %
Lymphs Abs: 1 10*3/uL (ref 0.7–4.0)
MCH: 29 pg (ref 26.0–34.0)
MCHC: 33.1 g/dL (ref 30.0–36.0)
MCV: 87.5 fL (ref 80.0–100.0)
Monocytes Absolute: 0.4 10*3/uL (ref 0.1–1.0)
Monocytes Relative: 14 %
Neutro Abs: 1.6 10*3/uL — ABNORMAL LOW (ref 1.7–7.7)
Neutrophils Relative %: 51 %
Platelet Count: 150 10*3/uL (ref 150–400)
RBC: 4.97 MIL/uL (ref 4.22–5.81)
RDW: 12.8 % (ref 11.5–15.5)
WBC Count: 3 10*3/uL — ABNORMAL LOW (ref 4.0–10.5)
nRBC: 0 % (ref 0.0–0.2)

## 2023-03-11 ENCOUNTER — Other Ambulatory Visit: Payer: Medicare Other

## 2023-04-04 ENCOUNTER — Encounter: Payer: Self-pay | Admitting: Oncology

## 2023-04-11 ENCOUNTER — Encounter: Payer: Self-pay | Admitting: Oncology

## 2023-04-11 ENCOUNTER — Inpatient Hospital Stay (HOSPITAL_BASED_OUTPATIENT_CLINIC_OR_DEPARTMENT_OTHER): Payer: Medicare Other | Admitting: Oncology

## 2023-04-11 ENCOUNTER — Inpatient Hospital Stay: Payer: Medicare Other | Attending: Oncology

## 2023-04-11 VITALS — BP 134/85 | HR 88 | Temp 96.8°F | Resp 18 | Wt 175.8 lb

## 2023-04-11 DIAGNOSIS — D61818 Other pancytopenia: Secondary | ICD-10-CM | POA: Insufficient documentation

## 2023-04-11 DIAGNOSIS — D709 Neutropenia, unspecified: Secondary | ICD-10-CM | POA: Insufficient documentation

## 2023-04-11 DIAGNOSIS — Z79620 Long term (current) use of immunosuppressive biologic: Secondary | ICD-10-CM

## 2023-04-11 LAB — CBC WITH DIFFERENTIAL (CANCER CENTER ONLY)
Abs Immature Granulocytes: 0.01 10*3/uL (ref 0.00–0.07)
Basophils Absolute: 0 10*3/uL (ref 0.0–0.1)
Basophils Relative: 1 %
Eosinophils Absolute: 0.1 10*3/uL (ref 0.0–0.5)
Eosinophils Relative: 1 %
HCT: 46 % (ref 39.0–52.0)
Hemoglobin: 15.1 g/dL (ref 13.0–17.0)
Immature Granulocytes: 0 %
Lymphocytes Relative: 28 %
Lymphs Abs: 1.2 10*3/uL (ref 0.7–4.0)
MCH: 28.8 pg (ref 26.0–34.0)
MCHC: 32.8 g/dL (ref 30.0–36.0)
MCV: 87.8 fL (ref 80.0–100.0)
Monocytes Absolute: 0.5 10*3/uL (ref 0.1–1.0)
Monocytes Relative: 12 %
Neutro Abs: 2.5 10*3/uL (ref 1.7–7.7)
Neutrophils Relative %: 58 %
Platelet Count: 168 10*3/uL (ref 150–400)
RBC: 5.24 MIL/uL (ref 4.22–5.81)
RDW: 13 % (ref 11.5–15.5)
WBC Count: 4.3 10*3/uL (ref 4.0–10.5)
nRBC: 0 % (ref 0.0–0.2)

## 2023-04-11 NOTE — Progress Notes (Signed)
Hematology/Oncology Consult note [facility]  [phone] [phone]  Patient Care Team: Corky Downs, MD as PCP - General (Internal Medicine) Creig Hines, MD as Consulting Physician (Hematology and Oncology)   Name of the patient: Bobby Dean  [id number]  1991-01-18   Date of visit: 04/11/23  Diagnosis- autoimmune neutropenia and thrombocytopenia     Chief complaint/ Reason for visit-routine follow-up of autoimmune pancytopenia  Heme/Onc history: Patient is a 33 year old male was then referred to [place] for evaluation and management of thrombocytopenia. He has a history of autism and intellectual visibility and currently lives in a group home for the last 7 months. His medical decision maker is [name] from [place] DSS office.   Blood work from 04/28/2016 showed white count of 2.1 with an H&H of 15.5/45.1 and a platelet count of 51. CMP was within normal limits. TSH was normal at 1.09. At this time I do not have any prior CBCs for comparison.   Bone marrow biopsy on 05/14/2016 showed normal trilineage hematopoiesis with no evidence of lymphoma or leukemia. Peripheral flow cytometry showed reversal of CD4 to CD8 ratio. HIV hepatitis B and hepatitis C testing was negative. B12 and folate were within normal limits. PT/PTT INR was within normal limits.   Review of peripheral smear showed leukopenia and moderate neutropenia. Thrombocytopenia. RBC morphology is normal. Scattered reactive appearing lymphocytes are seen. No blasts or early forms noted.   Monospot testing was positive. ANA was negative. Quantitative immunoglobulins were within normal limits. Serum copper was within normal limits. CMV DNA PCR negative   Patient was started on empiric high dose steroids for possible autoimmune etiology on 05/28/16. His counts normalized. Then his steroid taper was started. His WBC remained normal but his platelets began to decline  60's.   Plan was to restart full dose steroids and switch to rituxan second line after receiving pre splenectomy vaccines in anticipation of splenectomy should he not respond to rituxan   Patient has received all his pre splenectomy vaccines. He started rituxan on 10/14/16. Seen at [facility] for second opinion by Dr. Anise Salvo who agrees with the plan.   Recurrent neutropenia/thrombocytopenia noted in sept 2019. rituxan and steroids restarted on 12/15/17 and counts normalized.  Patient has been requiring Rituxan roughly every 8 to 12 months  Interval history-weight has remained stable since November 2024 but he lost about 25 pounds prior to that.  Overall he feels well today and denies any specific complaints at this time  ECOG PS- 0 Pain scale- 0   Review of systems- Review of Systems  Constitutional:  Negative for chills, fever, malaise/fatigue and weight loss.  HENT:  Negative for congestion, ear discharge and nosebleeds.   Eyes:  Negative for blurred vision.  Respiratory:  Negative for cough, hemoptysis, sputum production, shortness of breath and wheezing.   Cardiovascular:  Negative for chest pain, palpitations, orthopnea and claudication.  Gastrointestinal:  Negative for abdominal pain, blood in stool, constipation, diarrhea, heartburn, melena, nausea and vomiting.  Genitourinary:  Negative for dysuria, flank pain, frequency, hematuria and urgency.  Musculoskeletal:  Negative for back pain, joint pain and myalgias.  Skin:  Negative for rash.  Neurological:  Negative for dizziness, tingling, focal weakness, seizures, weakness and headaches.  Endo/Heme/Allergies:  Does not bruise/bleed easily.  Psychiatric/Behavioral:  Negative for depression and suicidal ideas. The patient does not have insomnia.       No Known Allergies   Past Medical History:  Diagnosis Date  Anxiety    Autism    Autism    Autoimmune pancytopenia (HCC)      History reviewed. No pertinent surgical  history.  Social History   Socioeconomic History   Marital status: Single    Spouse name: Not on file   Number of children: Not on file   Years of education: Not on file   Highest education level: Not on file  Occupational History   Not on file  Tobacco Use   Smoking status: Never   Smokeless tobacco: Never  Vaping Use   Vaping status: Never Used  Substance and Sexual Activity   Alcohol use: No   Drug use: No   Sexual activity: Not Currently  Other Topics Concern   Not on file  Social History Narrative   Not on file   Social Drivers of Health   Financial Resource Strain: Low Risk  (01/25/2021)   Overall Financial Resource Strain (CARDIA)    Difficulty of Paying Living Expenses: Not hard at all  Food Insecurity: No Food Insecurity (01/25/2021)   Hunger Vital Sign    Worried About Running Out of Food in the Last Year: Never true    Ran Out of Food in the Last Year: Never true  Transportation Needs: No Transportation Needs (01/25/2021)   PRAPARE - Administrator, Civil Service (Medical): No    Lack of Transportation (Non-Medical): No  Physical Activity: Sufficiently Active (01/25/2021)   Exercise Vital Sign    Days of Exercise per Week: 4 days    Minutes of Exercise per Session: 40 min  Stress: No Stress Concern Present (01/25/2021)   [name] of Occupational Health - Occupational Stress Questionnaire    Feeling of Stress : Not at all  Social Connections: Moderately Isolated (01/25/2021)   Social Connection and Isolation Panel [NHANES]    Frequency of Communication with Friends and Family: More than three times a week    Frequency of Social Gatherings with Friends and Family: More than three times a week    Attends Religious Services: Never    Database administrator or Organizations: Yes    Attends Engineer, structural: More than 4 times per year    Marital Status: Never married  Intimate Partner Violence: Not At Risk (01/25/2021)    Humiliation, Afraid, Rape, and Kick questionnaire    Fear of Current or Ex-Partner: No    Emotionally Abused: No    Physically Abused: No    Sexually Abused: No    Family History  Problem Relation Age of Onset   Cancer Mother      Current Outpatient Medications:    potassium chloride SA (KLOR-CON M) 20 MEQ tablet, TAKE ONE TABLET BY MOUTH EVERY DAY, Disp: 90 tablet, Rfl: 0   predniSONE (DELTASONE) 10 MG tablet, Use 10 mg tablets , start 5 tablets daily for 3 days, then 4 tablets daily for 3 days, then 3 tablets daily for 3 days, then  2 tablets daily for 3 day, 1 tablet daily for 3 days then stop (Patient not taking: Reported on 04/11/2023), Disp: 45 tablet, Rfl: 0  Physical exam:  Vitals:   04/11/23 0943  BP: 134/85  Pulse: 88  Resp: 18  Temp: (!) 96.8 F (36 C)  TempSrc: Tympanic  SpO2: 99%  Weight: 175 lb 12.8 oz (79.7 kg)   Physical Exam Cardiovascular:     Rate and Rhythm: Normal rate and regular rhythm.     Heart sounds: Normal heart  sounds.  Pulmonary:     Effort: Pulmonary effort is normal.     Breath sounds: Normal breath sounds.  Skin:    General: Skin is warm and dry.  Neurological:     Mental Status: He is alert and oriented to person, place, and time.         Latest Ref Rng & Units 02/09/2023    8:21 AM  CMP  Glucose 70 - 99 mg/dL 87   BUN 6 - 20 mg/dL 22   Creatinine 1.61 - 1.24 mg/dL 0.96   Sodium 045 - 409 mmol/L 140   Potassium 3.5 - 5.1 mmol/L 3.6   Chloride 98 - 111 mmol/L 102   CO2 22 - 32 mmol/L 28   Calcium 8.9 - 10.3 mg/dL 9.1   Total Protein 6.5 - 8.1 g/dL 6.9   Total Bilirubin <8.1 mg/dL 1.1   Alkaline Phos 38 - 126 U/L 50   AST 15 - 41 U/L 15   ALT 0 - 44 U/L 16       Latest Ref Rng & Units 04/11/2023    9:07 AM  CBC  WBC 4.0 - 10.5 K/uL 4.3   Hemoglobin 13.0 - 17.0 g/dL 19.1   Hematocrit 47.8 - 52.0 % 46.0   Platelets 150 - 400 K/uL 168     Assessment and plan- Patient is a 33 y.o. male with history of autoimmune  pancytopenia predominantly neutropenia and thrombocytopenia here for routine follow-up  Patient last received Rituxan in November 2024 due to his neutropenia and his ANC back in October was 0.2 with a platelet count of 84.  Following Rituxan and steroid taper his neutrophil count today is 2.5.  Platelet counts are normal at 168.  I will continue to monitor CBC with differential in 2 4 and 6 months and see him back in 6 months   Visit Diagnosis 1. Autoimmune pancytopenia (HCC)      Dr. Owens Shark, MD, MPH [facility] [phone] 04/11/2023 1:12 PM

## 2023-06-02 ENCOUNTER — Ambulatory Visit (INDEPENDENT_AMBULATORY_CARE_PROVIDER_SITE_OTHER): Admitting: Nurse Practitioner

## 2023-06-02 ENCOUNTER — Encounter: Payer: Self-pay | Admitting: Nurse Practitioner

## 2023-06-02 VITALS — BP 130/82 | HR 78 | Temp 98.2°F | Resp 16 | Ht 74.0 in | Wt 183.6 lb

## 2023-06-02 DIAGNOSIS — F840 Autistic disorder: Secondary | ICD-10-CM | POA: Diagnosis not present

## 2023-06-02 DIAGNOSIS — E876 Hypokalemia: Secondary | ICD-10-CM | POA: Diagnosis not present

## 2023-06-02 DIAGNOSIS — D61818 Other pancytopenia: Secondary | ICD-10-CM

## 2023-06-02 DIAGNOSIS — F419 Anxiety disorder, unspecified: Secondary | ICD-10-CM

## 2023-06-02 DIAGNOSIS — D693 Immune thrombocytopenic purpura: Secondary | ICD-10-CM | POA: Diagnosis not present

## 2023-06-02 MED ORDER — POTASSIUM CHLORIDE CRYS ER 20 MEQ PO TBCR
20.0000 meq | EXTENDED_RELEASE_TABLET | Freq: Every day | ORAL | 0 refills | Status: DC
Start: 2023-06-02 — End: 2023-10-05

## 2023-06-02 NOTE — Progress Notes (Signed)
 [facility] [address] [place], [place] [address]  Internal MEDICINE  Office Visit Note  Patient Name: Bobby Dean  [id number]  [id number]  Date of Service: 06/02/2023   Complaints/HPI Pt is here for establishment of PCP. Chief Complaint  Patient presents with   Establish Care    HPI Bobby Dean presents for a new patient visit to establish care.  Well-appearing 33 y.o. male with autoimmune pancytopenia, anxiety and autism. He also has a history of low potassium and has been taking a prescription potassium supplement. Patient  Work: disabled Home: lives in a group home  Diet: fair Exercise: minimal Tobacco use: Alcohol use: none Illicit drug use: none Labs: labs are up to date, sees heme/onc, Dr. Smith Dean and recently had labs done in January.  New or worsening pain: none   Current Medication: Outpatient Encounter Medications as of 06/02/2023  Medication Sig   [DISCONTINUED] potassium chloride SA (KLOR-CON M) 20 MEQ tablet TAKE ONE TABLET BY MOUTH EVERY DAY   potassium chloride SA (KLOR-CON M) 20 MEQ tablet Take 1 tablet (20 mEq total) by mouth daily.   [DISCONTINUED] predniSONE (DELTASONE) 10 MG tablet Use 10 mg tablets , start 5 tablets daily for 3 days, then 4 tablets daily for 3 days, then 3 tablets daily for 3 days, then  2 tablets daily for 3 day, 1 tablet daily for 3 days then stop (Patient not taking: Reported on 04/11/2023)   No facility-administered encounter medications on file as of 06/02/2023.    Surgical History: History reviewed. No pertinent surgical history.  Medical History: Past Medical History:  Diagnosis Date   Anxiety    Autism    Autism    Autoimmune pancytopenia (HCC)     Family History: Family History  Problem Relation Age of Onset   Cancer Mother     Social History   Socioeconomic History   Marital status: Single    Spouse name: Not on file   Number of children: Not on file   Years of education: Not on file   Highest  education level: Not on file  Occupational History   Not on file  Tobacco Use   Smoking status: Never   Smokeless tobacco: Never  Vaping Use   Vaping status: Never Used  Substance and Sexual Activity   Alcohol use: No   Drug use: No   Sexual activity: Not Currently  Other Topics Concern   Not on file  Social History Narrative   Not on file   Social Drivers of Health   Financial Resource Strain: Low Risk  (01/25/2021)   Overall Financial Resource Strain (CARDIA)    Difficulty of Paying Living Expenses: Not hard at all  Food Insecurity: No Food Insecurity (01/25/2021)   Hunger Vital Sign    Worried About Running Out of Food in the Last Year: Never true    Ran Out of Food in the Last Year: Never true  Transportation Needs: No Transportation Needs (01/25/2021)   PRAPARE - Administrator, Civil Service (Medical): No    Lack of Transportation (Non-Medical): No  Physical Activity: Sufficiently Active (01/25/2021)   Exercise Vital Sign    Days of Exercise per Week: 4 days    Minutes of Exercise per Session: 40 min  Stress: No Stress Concern Present (01/25/2021)   [employer] - Occupational Stress Questionnaire    Feeling of Stress : Not at all  Social Connections: Moderately Isolated (01/25/2021)   Social Connection and  Isolation Panel [NHANES]    Frequency of Communication with Friends and Family: More than three times a week    Frequency of Social Gatherings with Friends and Family: More than three times a week    Attends Religious Services: Never    Database administrator or Organizations: Yes    Attends Engineer, structural: More than 4 times per year    Marital Status: Never married  Intimate Partner Violence: Not At Risk (01/25/2021)   Humiliation, Afraid, Rape, and Kick questionnaire    Fear of Current or Ex-Partner: No    Emotionally Abused: No    Physically Abused: No    Sexually Abused: No     Review of Systems   Constitutional:  Negative for chills, fatigue and unexpected weight change.  HENT:  Negative for congestion, postnasal drip, rhinorrhea, sneezing and sore throat.   Eyes:  Negative for redness.  Respiratory:  Negative for cough, chest tightness and shortness of breath.   Cardiovascular:  Negative for chest pain and palpitations.  Gastrointestinal:  Negative for abdominal pain, constipation, diarrhea, nausea and vomiting.  Genitourinary:  Negative for dysuria and frequency.  Musculoskeletal:  Negative for arthralgias, back pain, joint swelling and neck pain.  Skin:  Negative for rash.  Neurological: Negative.  Negative for tremors and numbness.  Hematological:  Negative for adenopathy. Does not bruise/bleed easily.  Psychiatric/Behavioral:  Negative for behavioral problems (Depression), sleep disturbance and suicidal ideas. The patient is nervous/anxious.     Vital Signs: BP 130/82   Pulse 78   Temp 98.2 F (36.8 C)   Resp 16   Ht 6\' 2"  (1.88 m)   Wt 183 lb 9.6 oz (83.3 kg)   SpO2 99%   BMI 23.57 kg/m    Physical Exam Vitals reviewed.  Constitutional:      General: He is not in acute distress.    Appearance: Normal appearance. He is normal weight. He is not ill-appearing.  HENT:     Head: Normocephalic and atraumatic.  Eyes:     Pupils: Pupils are equal, round, and reactive to light.  Cardiovascular:     Rate and Rhythm: Normal rate and regular rhythm.  Pulmonary:     Effort: Pulmonary effort is normal. No respiratory distress.  Neurological:     Mental Status: He is alert and oriented to person, place, and time.  Psychiatric:        Mood and Affect: Mood normal.        Behavior: Behavior normal.       Assessment/Plan: 1. Idiopathic thrombocytopenic purpura (ITP) (HCC) (Primary) Monitored and treated by hematology, Dr. Smith Dean  2. Autoimmune pancytopenia (HCC) Monitored by Dr. Smith Dean  3. Hypokalemia Continue potassium supplement as prescribed.  - potassium  chloride SA (KLOR-CON M) 20 MEQ tablet; Take 1 tablet (20 mEq total) by mouth daily.  Dispense: 90 tablet; Refill: 0  4. Autism Live in a group home that manages his care.   5. Anxiety Mild anxiety. Not on any medication for this. No interventions needed at this time.     General Counseling: Bobby rosamond understanding of the findings of todays visit and agrees with plan of treatment. I have discussed any further diagnostic evaluation that may be needed or ordered today. We also reviewed his medications today. he has been encouraged to call the office with any questions or concerns that should arise related to todays visit.    No orders of the defined types were placed in this encounter.  Meds ordered this encounter  Medications   potassium chloride SA (KLOR-CON M) 20 MEQ tablet    Sig: Take 1 tablet (20 mEq total) by mouth daily.    Dispense:  90 tablet    Refill:  0    Return in about 3 months (around 09/02/2023) for F/U, Daylyn Christine PCP routine.  Time spent:30 Minutes Time spent with patient included reviewing progress notes, labs, imaging studies, and discussing plan for follow up.   [place] Controlled Substance Database was reviewed by me for overdose risk score (ORS)   This patient was seen by Sallyanne Kuster, FNP-C in collaboration with Dr. Beverely Risen as a part of collaborative care agreement.   Destiney Sanabia R. Tedd Sias, MSN, FNP-C Internal Medicine

## 2023-06-09 ENCOUNTER — Inpatient Hospital Stay: Payer: Medicare Other | Attending: Oncology

## 2023-06-09 DIAGNOSIS — D61818 Other pancytopenia: Secondary | ICD-10-CM | POA: Insufficient documentation

## 2023-06-09 LAB — CBC WITH DIFFERENTIAL (CANCER CENTER ONLY)
Abs Immature Granulocytes: 0 10*3/uL (ref 0.00–0.07)
Basophils Absolute: 0 10*3/uL (ref 0.0–0.1)
Basophils Relative: 0 %
Eosinophils Absolute: 0.1 10*3/uL (ref 0.0–0.5)
Eosinophils Relative: 2 %
HCT: 44.6 % (ref 39.0–52.0)
Hemoglobin: 14.9 g/dL (ref 13.0–17.0)
Immature Granulocytes: 0 %
Lymphocytes Relative: 31 %
Lymphs Abs: 1.1 10*3/uL (ref 0.7–4.0)
MCH: 29 pg (ref 26.0–34.0)
MCHC: 33.4 g/dL (ref 30.0–36.0)
MCV: 86.9 fL (ref 80.0–100.0)
Monocytes Absolute: 0.4 10*3/uL (ref 0.1–1.0)
Monocytes Relative: 11 %
Neutro Abs: 1.9 10*3/uL (ref 1.7–7.7)
Neutrophils Relative %: 56 %
Platelet Count: 193 10*3/uL (ref 150–400)
RBC: 5.13 MIL/uL (ref 4.22–5.81)
RDW: 12.5 % (ref 11.5–15.5)
WBC Count: 3.5 10*3/uL — ABNORMAL LOW (ref 4.0–10.5)
nRBC: 0 % (ref 0.0–0.2)

## 2023-08-09 ENCOUNTER — Inpatient Hospital Stay: Payer: Medicare Other | Attending: Oncology

## 2023-08-11 ENCOUNTER — Inpatient Hospital Stay: Attending: Oncology

## 2023-08-11 DIAGNOSIS — D61818 Other pancytopenia: Secondary | ICD-10-CM | POA: Diagnosis present

## 2023-08-11 LAB — CBC WITH DIFFERENTIAL (CANCER CENTER ONLY)
Abs Immature Granulocytes: 0 10*3/uL (ref 0.00–0.07)
Basophils Absolute: 0 10*3/uL (ref 0.0–0.1)
Basophils Relative: 1 %
Eosinophils Absolute: 0.1 10*3/uL (ref 0.0–0.5)
Eosinophils Relative: 2 %
HCT: 45 % (ref 39.0–52.0)
Hemoglobin: 14.9 g/dL (ref 13.0–17.0)
Immature Granulocytes: 0 %
Lymphocytes Relative: 28 %
Lymphs Abs: 1 10*3/uL (ref 0.7–4.0)
MCH: 28.7 pg (ref 26.0–34.0)
MCHC: 33.1 g/dL (ref 30.0–36.0)
MCV: 86.5 fL (ref 80.0–100.0)
Monocytes Absolute: 0.4 10*3/uL (ref 0.1–1.0)
Monocytes Relative: 10 %
Neutro Abs: 2 10*3/uL (ref 1.7–7.7)
Neutrophils Relative %: 59 %
Platelet Count: 179 10*3/uL (ref 150–400)
RBC: 5.2 MIL/uL (ref 4.22–5.81)
RDW: 12.4 % (ref 11.5–15.5)
WBC Count: 3.5 10*3/uL — ABNORMAL LOW (ref 4.0–10.5)
nRBC: 0 % (ref 0.0–0.2)

## 2023-09-19 ENCOUNTER — Encounter: Payer: Self-pay | Admitting: Nurse Practitioner

## 2023-09-19 ENCOUNTER — Ambulatory Visit (INDEPENDENT_AMBULATORY_CARE_PROVIDER_SITE_OTHER): Admitting: Nurse Practitioner

## 2023-09-19 ENCOUNTER — Other Ambulatory Visit: Payer: Self-pay | Admitting: Nurse Practitioner

## 2023-09-19 VITALS — BP 128/68 | HR 72 | Temp 98.2°F | Resp 16 | Ht 74.0 in | Wt 170.4 lb

## 2023-09-19 DIAGNOSIS — D61818 Other pancytopenia: Secondary | ICD-10-CM | POA: Diagnosis not present

## 2023-09-19 DIAGNOSIS — E876 Hypokalemia: Secondary | ICD-10-CM | POA: Diagnosis not present

## 2023-09-19 DIAGNOSIS — D693 Immune thrombocytopenic purpura: Secondary | ICD-10-CM

## 2023-09-19 DIAGNOSIS — F840 Autistic disorder: Secondary | ICD-10-CM

## 2023-09-19 DIAGNOSIS — F419 Anxiety disorder, unspecified: Secondary | ICD-10-CM

## 2023-09-19 NOTE — Progress Notes (Signed)
 [facility] [address] [place], [place] [address]  Internal MEDICINE  Office Visit Note  Patient Name: Bobby Dean  [id number]  [id number]  Date of Service: 09/19/2023  Chief Complaint  Patient presents with   Follow-up    HPI Ruffin presents for a follow-up visit for blood disorder, anxiety, low potassium, and autism.  Thrombocytopenia and pancytopenia -- has appt with hematology at the end of July.  Autism -- accompanied by caregivers to appt today. No issues Anxiety -- stable, no issues, not currently on any medications Hypokalemia -- takes potassium supplement daily, no issues. Needs to have repeat potassium lab again.  Last 13 lbs since last office visit. Will start drinking boost nutritional shakes again.   Current Medication: Outpatient Encounter Medications as of 09/19/2023  Medication Sig   potassium chloride  SA (KLOR-CON  M) 20 MEQ tablet Take 1 tablet (20 mEq total) by mouth daily.   No facility-administered encounter medications on file as of 09/19/2023.    Surgical History: History reviewed. No pertinent surgical history.  Medical History: Past Medical History:  Diagnosis Date   Anxiety    Autism    Autism    Autoimmune pancytopenia (HCC)     Family History: Family History  Problem Relation Age of Onset   Cancer Mother     Social History   Socioeconomic History   Marital status: Single    Spouse name: Not on file   Number of children: Not on file   Years of education: Not on file   Highest education level: Not on file  Occupational History   Not on file  Tobacco Use   Smoking status: Never   Smokeless tobacco: Never  Vaping Use   Vaping status: Never Used  Substance and Sexual Activity   Alcohol use: No   Drug use: No   Sexual activity: Not Currently  Other Topics Concern   Not on file  Social History Narrative   Not on file   Social Drivers of Health   Financial Resource Strain: Low Risk  (01/25/2021)   Overall  Financial Resource Strain (CARDIA)    Difficulty of Paying Living Expenses: Not hard at all  Food Insecurity: No Food Insecurity (01/25/2021)   Hunger Vital Sign    Worried About Running Out of Food in the Last Year: Never true    Ran Out of Food in the Last Year: Never true  Transportation Needs: No Transportation Needs (01/25/2021)   PRAPARE - Administrator, Civil Service (Medical): No    Lack of Transportation (Non-Medical): No  Physical Activity: Sufficiently Active (01/25/2021)   Exercise Vital Sign    Days of Exercise per Week: 4 days    Minutes of Exercise per Session: 40 min  Stress: No Stress Concern Present (01/25/2021)   [name] of Occupational Health - Occupational Stress Questionnaire    Feeling of Stress : Not at all  Social Connections: Moderately Isolated (01/25/2021)   Social Connection and Isolation Panel    Frequency of Communication with Friends and Family: More than three times a week    Frequency of Social Gatherings with Friends and Family: More than three times a week    Attends Religious Services: Never    Database administrator or Organizations: Yes    Attends Banker Meetings: More than 4 times per year    Marital Status: Never married  Intimate Partner Violence: Not At Risk (01/25/2021)   Humiliation, Afraid, Rape, and Kick questionnaire  Fear of Current or Ex-Partner: No    Emotionally Abused: No    Physically Abused: No    Sexually Abused: No      Review of Systems  Constitutional:  Negative for chills, fatigue and unexpected weight change.  HENT:  Negative for congestion, postnasal drip, rhinorrhea, sneezing and sore throat.   Eyes:  Negative for redness.  Respiratory:  Negative for cough, chest tightness and shortness of breath.   Cardiovascular:  Negative for chest pain and palpitations.  Gastrointestinal:  Negative for abdominal pain, constipation, diarrhea, nausea and vomiting.  Genitourinary:  Negative  for dysuria and frequency.  Musculoskeletal:  Negative for arthralgias, back pain, joint swelling and neck pain.  Skin:  Negative for rash.  Neurological: Negative.  Negative for tremors and numbness.  Hematological:  Negative for adenopathy. Does not bruise/bleed easily.  Psychiatric/Behavioral:  Negative for behavioral problems (Depression), sleep disturbance and suicidal ideas. The patient is nervous/anxious.     Vital Signs: BP 128/68   Pulse 72   Temp 98.2 F (36.8 C)   Resp 16   Ht 6' 2 (1.88 m)   Wt 170 lb 6.4 oz (77.3 kg)   SpO2 97%   BMI 21.88 kg/m    Physical Exam Vitals reviewed.  Constitutional:      General: He is not in acute distress.    Appearance: Normal appearance. He is normal weight. He is not ill-appearing.  HENT:     Head: Normocephalic and atraumatic.  Eyes:     Pupils: Pupils are equal, round, and reactive to light.  Cardiovascular:     Rate and Rhythm: Normal rate and regular rhythm.  Pulmonary:     Effort: Pulmonary effort is normal. No respiratory distress.  Neurological:     Mental Status: He is alert and oriented to person, place, and time.  Psychiatric:        Mood and Affect: Mood normal.        Behavior: Behavior normal.        Assessment/Plan: 1. Idiopathic thrombocytopenic purpura (ITP) (HCC) (Primary) Sees oncology twice a year and has labs drawn every 3 months   2. Autoimmune pancytopenia [facility]) Sees oncology twice a year and has labs drawn every 3 months   3. Hypokalemia Takes potassium supplement, needs to have potassium level checked.   4. Autism Accompanied by caregiver to visit today, no issues   5. Anxiety Stable, no issues    General Counseling: Parke verbalizes understanding of the findings of todays visit and agrees with plan of treatment. I have discussed any further diagnostic evaluation that may be needed or ordered today. We also reviewed his medications today. he has been encouraged to call the office  with any questions or concerns that should arise related to todays visit.    No orders of the defined types were placed in this encounter.   No orders of the defined types were placed in this encounter.   Return in about 3 months (around 12/20/2023) for F/U, Aiken Withem PCP.   Total time spent:30 Minutes Time spent includes review of chart, medications, test results, and follow up plan with the patient.   [place] Controlled Substance Database was reviewed by me.  This patient was seen by Mardy Maxin, FNP-C in collaboration with Dr. Sigrid Bathe as a part of collaborative care agreement.   Diogenes Whirley R. Maxin, MSN, FNP-C Internal medicine

## 2023-10-03 ENCOUNTER — Encounter: Payer: Self-pay | Admitting: Nurse Practitioner

## 2023-10-03 ENCOUNTER — Inpatient Hospital Stay (HOSPITAL_BASED_OUTPATIENT_CLINIC_OR_DEPARTMENT_OTHER): Admitting: Nurse Practitioner

## 2023-10-03 ENCOUNTER — Inpatient Hospital Stay: Attending: Oncology

## 2023-10-03 ENCOUNTER — Telehealth: Payer: Self-pay

## 2023-10-03 VITALS — BP 125/87 | HR 55 | Temp 98.2°F | Resp 16 | Wt 170.4 lb

## 2023-10-03 DIAGNOSIS — D61818 Other pancytopenia: Secondary | ICD-10-CM | POA: Diagnosis present

## 2023-10-03 LAB — CBC WITH DIFFERENTIAL (CANCER CENTER ONLY)
Abs Immature Granulocytes: 0.01 K/uL (ref 0.00–0.07)
Basophils Absolute: 0 K/uL (ref 0.0–0.1)
Basophils Relative: 1 %
Eosinophils Absolute: 0.1 K/uL (ref 0.0–0.5)
Eosinophils Relative: 2 %
HCT: 42.4 % (ref 39.0–52.0)
Hemoglobin: 14.1 g/dL (ref 13.0–17.0)
Immature Granulocytes: 0 %
Lymphocytes Relative: 29 %
Lymphs Abs: 1.3 K/uL (ref 0.7–4.0)
MCH: 29 pg (ref 26.0–34.0)
MCHC: 33.3 g/dL (ref 30.0–36.0)
MCV: 87.2 fL (ref 80.0–100.0)
Monocytes Absolute: 0.4 K/uL (ref 0.1–1.0)
Monocytes Relative: 9 %
Neutro Abs: 2.6 K/uL (ref 1.7–7.7)
Neutrophils Relative %: 59 %
Platelet Count: 162 K/uL (ref 150–400)
RBC: 4.86 MIL/uL (ref 4.22–5.81)
RDW: 12.7 % (ref 11.5–15.5)
WBC Count: 4.5 K/uL (ref 4.0–10.5)
nRBC: 0 % (ref 0.0–0.2)

## 2023-10-03 LAB — BASIC METABOLIC PANEL - CANCER CENTER ONLY
Anion gap: 6 (ref 5–15)
BUN: 9 mg/dL (ref 6–20)
CO2: 28 mmol/L (ref 22–32)
Calcium: 9.2 mg/dL (ref 8.9–10.3)
Chloride: 102 mmol/L (ref 98–111)
Creatinine: 0.67 mg/dL (ref 0.61–1.24)
GFR, Estimated: >60 mL/min (ref >60)
Glucose, Bld: 93 mg/dL (ref 70–99)
Potassium: 4.4 mmol/L (ref 3.5–5.1)
Sodium: 136 mmol/L (ref 135–145)

## 2023-10-03 NOTE — Progress Notes (Signed)
 Hematology/Oncology Consult Note [facility]  [phone] [phone]  Patient Care Team: Liana Fish, NP as PCP - General (Nurse Practitioner) Melanee Annah BROCKS, MD as Consulting Physician (Hematology and Oncology)   Name of the patient: Bobby Dean  [id number]  [birth year]   Date of visit: 10/03/23  Diagnosis- autoimmune neutropenia and thrombocytopenia    Chief complaint/ Reason for visit- routine follow-up of autoimmune pancytopenia  Heme/Onc history: Patient is a 33 year old male was then referred to us  for evaluation and management of thrombocytopenia. He has a history of autism and intellectual visibility and currently lives in a group home for the last 7 months. His medical decision maker is Mr. Rogelia Ada from [place] [facility] office.   Blood work from 04/28/2016 showed white count of 2.1 with an H&H of 15.5/45.1 and a platelet count of 51. CMP was within normal limits. TSH was normal at 1.09. At this time I do not have any prior CBCs for comparison.   Bone marrow biopsy on 05/14/2016 showed normal trilineage hematopoiesis with no evidence of lymphoma or leukemia. Peripheral flow cytometry showed reversal of CD4 to CD8 ratio. HIV hepatitis B and hepatitis C testing was negative. B12 and folate were within normal limits. PT/PTT INR was within normal limits.   Review of peripheral smear showed leukopenia and moderate neutropenia. Thrombocytopenia. RBC morphology is normal. Scattered reactive appearing lymphocytes are seen. No blasts or early forms noted.   Monospot testing was positive. ANA was negative. Quantitative immunoglobulins were within normal limits. Serum copper  was within normal limits. CMV DNA PCR negative   Patient was started on empiric high dose steroids for possible autoimmune etiology on 05/28/16. His counts normalized. Then his steroid taper was started. His WBC remained normal but his platelets began to decline  60's.   Plan was to restart full dose steroids and switch to rituxan  second line after receiving pre splenectomy vaccines in anticipation of splenectomy should he not respond to rituxan    Patient has received all his pre splenectomy vaccines. He started rituxan  on 10/14/16. Seen at [facility] for second opinion by Dr. Jerrye who agrees with the plan.   Recurrent neutropenia/thrombocytopenia noted in sept 2019. rituxan  and steroids restarted on 12/15/17 and counts normalized.  Patient has been requiring Rituxan  roughly every 8 to 12 months  Interval history- Bobby Dean is a 33 y.o. male who returns to clinic for routine follow up. He continues to feel at baseline and denies complaints. Last received rituximab  Nov 2024. Denies interval infections. Weight is stable.   ECOG PS- 0 Pain scale- 0  Review of systems- Review of Systems  Constitutional:  Negative for chills, fever, malaise/fatigue and weight loss.  HENT:  Negative for congestion, ear discharge and nosebleeds.   Eyes:  Negative for blurred vision.  Respiratory:  Negative for cough, hemoptysis, sputum production, shortness of breath and wheezing.   Cardiovascular:  Negative for chest pain, palpitations, orthopnea and claudication.  Gastrointestinal:  Negative for abdominal pain, blood in stool, constipation, diarrhea, heartburn, melena, nausea and vomiting.  Genitourinary:  Negative for dysuria, flank pain, frequency, hematuria and urgency.  Musculoskeletal:  Negative for back pain, joint pain and myalgias.  Skin:  Negative for rash.  Neurological:  Negative for dizziness, tingling, focal weakness, seizures, weakness and headaches.  Endo/Heme/Allergies:  Does not bruise/bleed easily.  Psychiatric/Behavioral:  Negative for depression and suicidal ideas. The patient does not have insomnia.     No Known Allergies  Past Medical History:  Diagnosis Date  Anxiety    Autism    Autism    Autoimmune pancytopenia (HCC)    History  reviewed. No pertinent surgical history.  Social History   Socioeconomic History   Marital status: Single    Spouse name: Not on file   Number of children: Not on file   Years of education: Not on file   Highest education level: Not on file  Occupational History   Not on file  Tobacco Use   Smoking status: Never   Smokeless tobacco: Never  Vaping Use   Vaping status: Never Used  Substance and Sexual Activity   Alcohol use: No   Drug use: No   Sexual activity: Not Currently  Other Topics Concern   Not on file  Social History Narrative   Not on file   Social Drivers of Health   Financial Resource Strain: Low Risk  (01/25/2021)   Overall Financial Resource Strain (CARDIA)    Difficulty of Paying Living Expenses: Not hard at all  Food Insecurity: No Food Insecurity (01/25/2021)   Hunger Vital Sign    Worried About Running Out of Food in the Last Year: Never true    Ran Out of Food in the Last Year: Never true  Transportation Needs: No Transportation Needs (01/25/2021)   PRAPARE - Administrator, Civil Service (Medical): No    Lack of Transportation (Non-Medical): No  Physical Activity: Sufficiently Active (01/25/2021)   Exercise Vital Sign    Days of Exercise per Week: 4 days    Minutes of Exercise per Session: 40 min  Stress: No Stress Concern Present (01/25/2021)   [name] of Occupational Health - Occupational Stress Questionnaire    Feeling of Stress : Not at all  Social Connections: Moderately Isolated (01/25/2021)   Social Connection and Isolation Panel    Frequency of Communication with Friends and Family: More than three times a week    Frequency of Social Gatherings with Friends and Family: More than three times a week    Attends Religious Services: Never    Database administrator or Organizations: Yes    Attends Engineer, structural: More than 4 times per year    Marital Status: Never married  Intimate Partner Violence: Not At  Risk (01/25/2021)   Humiliation, Afraid, Rape, and Kick questionnaire    Fear of Current or Ex-Partner: No    Emotionally Abused: No    Physically Abused: No    Sexually Abused: No   Family History  Problem Relation Age of Onset   Cancer Mother     Current Outpatient Medications:    potassium chloride  SA (KLOR-CON  M) 20 MEQ tablet, Take 1 tablet (20 mEq total) by mouth daily., Disp: 90 tablet, Rfl: 0  Physical exam:  Vitals:   10/03/23 0942  BP: 125/87  Pulse: (!) 55  Resp: 16  Temp: 98.2 F (36.8 C)  TempSrc: Tympanic  SpO2: 100%  Weight: 170 lb 6.4 oz (77.3 kg)   Physical Exam Vitals reviewed.  Constitutional:      Appearance: He is not ill-appearing.  Cardiovascular:     Rate and Rhythm: Normal rate and regular rhythm.     Heart sounds: Normal heart sounds.  Pulmonary:     Effort: Pulmonary effort is normal.  Abdominal:     General: There is no distension.  Skin:    General: Skin is warm and dry.     Findings: No rash.  Neurological:     Mental  Status: He is alert and oriented to person, place, and time.  Psychiatric:        Mood and Affect: Mood normal.        Behavior: Behavior normal.        Latest Ref Rng & Units 10/03/2023    9:35 AM  CMP  Glucose 70 - 99 mg/dL 93   BUN 6 - 20 mg/dL 9   Creatinine 9.38 - 8.75 mg/dL 9.32   Sodium 864 - 854 mmol/L 136   Potassium 3.5 - 5.1 mmol/L 4.4   Chloride 98 - 111 mmol/L 102   CO2 22 - 32 mmol/L 28   Calcium  8.9 - 10.3 mg/dL 9.2       Latest Ref Rng & Units 10/03/2023    9:35 AM  CBC  WBC 4.0 - 10.5 K/uL 4.5   Hemoglobin 13.0 - 17.0 g/dL 85.8   Hematocrit 60.9 - 52.0 % 42.4   Platelets 150 - 400 K/uL 162     Assessment and plan- Patient is a 33 y.o. male who returns to clinic for follow up of:   Autoimmune pancytopenia-  predominantly neutropenia and thrombocytopenia. Received rituximab  01/2023 due to neutropenia. Following rituxan  and steroid taper his neutrophil count was 2.5. Today, ANC 2.6,  platelets 162. Normal. Asymptomatic. Monitor.   Disposition:  4 mo- labs (cbc w/ diff, cmp), Dr Melanee- la   Visit Diagnosis 1. Autoimmune pancytopenia (HCC)    Tinnie Dawn, DNP, AGNP-C, [facility] at [facility] [phone] (clinic) 10/03/2023

## 2023-10-05 ENCOUNTER — Other Ambulatory Visit: Payer: Self-pay | Admitting: Nurse Practitioner

## 2023-10-05 ENCOUNTER — Other Ambulatory Visit: Payer: Self-pay

## 2023-10-05 DIAGNOSIS — E876 Hypokalemia: Secondary | ICD-10-CM

## 2023-10-05 NOTE — Telephone Encounter (Signed)
 Spoke to [place] about holding his potassium for 30 day and she will be coming to pick up a d/c order for 30 days after she gets back from vacation. Lab order is ordered.

## 2023-10-05 NOTE — Telephone Encounter (Signed)
 Please review  7/21 labs done and its normal

## 2023-10-10 ENCOUNTER — Other Ambulatory Visit: Payer: Medicare Other

## 2023-10-10 ENCOUNTER — Ambulatory Visit: Payer: Medicare Other | Admitting: Nurse Practitioner

## 2023-11-04 ENCOUNTER — Encounter: Payer: Self-pay | Admitting: Oncology

## 2023-11-04 LAB — BASIC METABOLIC PANEL WITH GFR
BUN/Creatinine Ratio: 16 (ref 9–20)
BUN: 12 mg/dL (ref 6–20)
CO2: 24 mmol/L (ref 20–29)
Calcium: 9.6 mg/dL (ref 8.7–10.2)
Chloride: 102 mmol/L (ref 96–106)
Creatinine, Ser: 0.74 mg/dL — ABNORMAL LOW (ref 0.76–1.27)
Glucose: 70 mg/dL (ref 70–99)
Potassium: 4.1 mmol/L (ref 3.5–5.2)
Sodium: 141 mmol/L (ref 134–144)
eGFR: 125 mL/min/1.73 (ref >59)

## 2023-11-10 ENCOUNTER — Telehealth: Payer: Self-pay

## 2023-11-10 NOTE — Telephone Encounter (Signed)
 Pt caregiver advised that d/c potassium for now until next appt she will recheck his labs and pres for D/c order for Potassium is ready for pickup at front desk and copy for scan

## 2023-11-11 ENCOUNTER — Encounter: Payer: Self-pay | Admitting: Oncology

## 2023-12-21 ENCOUNTER — Encounter: Payer: Self-pay | Admitting: Nurse Practitioner

## 2023-12-21 ENCOUNTER — Ambulatory Visit (INDEPENDENT_AMBULATORY_CARE_PROVIDER_SITE_OTHER): Admitting: Nurse Practitioner

## 2023-12-21 VITALS — BP 136/84 | HR 96 | Temp 98.0°F | Resp 16 | Ht 74.0 in | Wt 172.6 lb

## 2023-12-21 DIAGNOSIS — E876 Hypokalemia: Secondary | ICD-10-CM

## 2023-12-21 DIAGNOSIS — F419 Anxiety disorder, unspecified: Secondary | ICD-10-CM

## 2023-12-21 DIAGNOSIS — D61818 Other pancytopenia: Secondary | ICD-10-CM | POA: Diagnosis not present

## 2023-12-21 DIAGNOSIS — Z23 Encounter for immunization: Secondary | ICD-10-CM

## 2023-12-21 DIAGNOSIS — D693 Immune thrombocytopenic purpura: Secondary | ICD-10-CM | POA: Diagnosis not present

## 2023-12-21 DIAGNOSIS — F840 Autistic disorder: Secondary | ICD-10-CM

## 2023-12-21 NOTE — Progress Notes (Signed)
 [facility] [address] [place], [place] [address]  Internal MEDICINE  Office Visit Note  Patient Name: Bobby Dean  [id number]  [id number]  Date of Service: 12/21/2023  Chief Complaint  Patient presents with   Follow-up    HPI Bobby Dean presents for a follow-up visit for blood disorder, anxiety, low potassium, and autism.  Thrombocytopenia and pancytopenia -- seen by hematology recently.  Autism -- accompanied by caregivers to appt today. No issues Anxiety -- stable, no issues, not currently on any medications Hypokalemia -- the last several potassium levels were normal. Supplemental potassium was discontinued in late July. No issues since then but his potassium level does need to be rechecked. Requesting flu vaccine today Gained 2 lbs since last office visit    Current Medication: Outpatient Encounter Medications as of 12/21/2023  Medication Sig   [DISCONTINUED] potassium chloride  SA (KLOR-CON  M) 20 MEQ tablet TAKE ONE TABLET BY MOUTH ONCE DAILY (Patient not taking: Reported on 12/21/2023)   No facility-administered encounter medications on file as of 12/21/2023.    Surgical History: History reviewed. No pertinent surgical history.  Medical History: Past Medical History:  Diagnosis Date   Anxiety    Autism    Autism    Autoimmune pancytopenia (HCC)     Family History: Family History  Problem Relation Age of Onset   Cancer Mother     Social History   Socioeconomic History   Marital status: Single    Spouse name: Not on file   Number of children: Not on file   Years of education: Not on file   Highest education level: Not on file  Occupational History   Not on file  Tobacco Use   Smoking status: Never   Smokeless tobacco: Never  Vaping Use   Vaping status: Never Used  Substance and Sexual Activity   Alcohol use: No   Drug use: No   Sexual activity: Not Currently  Other Topics Concern   Not on file  Social History Narrative   Not on  file   Social Drivers of Health   Financial Resource Strain: Low Risk  (01/25/2021)   Overall Financial Resource Strain (CARDIA)    Difficulty of Paying Living Expenses: Not hard at all  Food Insecurity: No Food Insecurity (01/25/2021)   Hunger Vital Sign    Worried About Running Out of Food in the Last Year: Never true    Ran Out of Food in the Last Year: Never true  Transportation Needs: No Transportation Needs (01/25/2021)   PRAPARE - Administrator, Civil Service (Medical): No    Lack of Transportation (Non-Medical): No  Physical Activity: Sufficiently Active (01/25/2021)   Exercise Vital Sign    Days of Exercise per Week: 4 days    Minutes of Exercise per Session: 40 min  Stress: No Stress Concern Present (01/25/2021)   [name] of Occupational Health - Occupational Stress Questionnaire    Feeling of Stress : Not at all  Social Connections: Moderately Isolated (01/25/2021)   Social Connection and Isolation Panel    Frequency of Communication with Friends and Family: More than three times a week    Frequency of Social Gatherings with Friends and Family: More than three times a week    Attends Religious Services: Never    Database administrator or Organizations: Yes    Attends Engineer, structural: More than 4 times per year    Marital Status: Never married  Intimate Partner Violence: Not  At Risk (01/25/2021)   Humiliation, Afraid, Rape, and Kick questionnaire    Fear of Current or Ex-Partner: No    Emotionally Abused: No    Physically Abused: No    Sexually Abused: No      Review of Systems  Constitutional:  Negative for chills, fatigue and unexpected weight change.  HENT:  Negative for congestion, postnasal drip, rhinorrhea, sneezing and sore throat.   Eyes:  Negative for redness.  Respiratory:  Negative for cough, chest tightness and shortness of breath.   Cardiovascular:  Negative for chest pain and palpitations.  Gastrointestinal:   Negative for abdominal pain, constipation, diarrhea, nausea and vomiting.  Genitourinary:  Negative for dysuria and frequency.  Musculoskeletal:  Negative for arthralgias, back pain, joint swelling and neck pain.  Skin:  Negative for rash.  Neurological: Negative.  Negative for tremors and numbness.  Hematological:  Negative for adenopathy. Does not bruise/bleed easily.  Psychiatric/Behavioral:  Negative for behavioral problems (Depression), sleep disturbance and suicidal ideas. The patient is nervous/anxious.     Vital Signs: BP 136/84   Pulse 96   Temp 98 F (36.7 C)   Resp 16   Ht 6' 2 (1.88 m)   Wt 172 lb 9.6 oz (78.3 kg)   SpO2 99%   BMI 22.16 kg/m    Physical Exam Vitals reviewed.  Constitutional:      General: He is not in acute distress.    Appearance: Normal appearance. He is normal weight. He is not ill-appearing.  HENT:     Head: Normocephalic and atraumatic.  Eyes:     Pupils: Pupils are equal, round, and reactive to light.  Cardiovascular:     Rate and Rhythm: Normal rate and regular rhythm.  Pulmonary:     Effort: Pulmonary effort is normal. No respiratory distress.  Neurological:     Mental Status: He is alert and oriented to person, place, and time.  Psychiatric:        Mood and Affect: Mood normal.        Behavior: Behavior normal.        Assessment/Plan: 1. Idiopathic thrombocytopenic purpura (ITP) (HCC) (Primary) Recently seen by oncology and labs were drawn, levels are stable  2. 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 Hypokalemia Potassium level has been normal and potassium supplement was discontinued in July. Repeat potassium lab.  - Potassium  4. Autism Accompanied by caregiver to visit today, no issues   5. Anxiety Stable, calm, no issues   6. Flu vaccine need Flu vaccine administered in office today - Influenza, MDCK, trivalent, PF(Flucelvax egg-free)   General  Counseling: Othel verbalizes understanding of the findings of todays visit and agrees with plan of treatment. I have discussed any further diagnostic evaluation that may be needed or ordered today. We also reviewed his medications today. he has been encouraged to call the office with any questions or concerns that should arise related to todays visit.    Orders Placed This Encounter  Procedures   Influenza, MDCK, trivalent, PF(Flucelvax egg-free)   Potassium    No orders of the defined types were placed in this encounter.   Return for AWV in december.   Total time spent:30 Minutes Time spent includes review of chart, medications, test results, and follow up plan with the patient.   [place] Controlled Substance Database was reviewed by me.  This patient was seen by Mardy Maxin, FNP-C in collaboration with Dr. Sigrid Bathe  as a part of collaborative care agreement.   Viaan Knippenberg R. Liana, MSN, FNP-C Internal medicine

## 2024-01-31 ENCOUNTER — Other Ambulatory Visit: Payer: Self-pay

## 2024-01-31 DIAGNOSIS — D61818 Other pancytopenia: Secondary | ICD-10-CM

## 2024-02-01 ENCOUNTER — Inpatient Hospital Stay (HOSPITAL_BASED_OUTPATIENT_CLINIC_OR_DEPARTMENT_OTHER): Admitting: Oncology

## 2024-02-01 ENCOUNTER — Encounter: Payer: Self-pay | Admitting: Oncology

## 2024-02-01 ENCOUNTER — Inpatient Hospital Stay: Attending: Oncology

## 2024-02-01 VITALS — BP 130/94 | HR 98 | Temp 97.6°F | Resp 19 | Wt 179.5 lb

## 2024-02-01 DIAGNOSIS — D61818 Other pancytopenia: Secondary | ICD-10-CM | POA: Insufficient documentation

## 2024-02-01 LAB — CBC WITH DIFFERENTIAL (CANCER CENTER ONLY)
Abs Immature Granulocytes: 0.01 K/uL (ref 0.00–0.07)
Basophils Absolute: 0 K/uL (ref 0.0–0.1)
Basophils Relative: 0 %
Eosinophils Absolute: 0.1 K/uL (ref 0.0–0.5)
Eosinophils Relative: 1 %
HCT: 44.2 % (ref 39.0–52.0)
Hemoglobin: 15.1 g/dL (ref 13.0–17.0)
Immature Granulocytes: 0 %
Lymphocytes Relative: 33 %
Lymphs Abs: 1.4 K/uL (ref 0.7–4.0)
MCH: 29.1 pg (ref 26.0–34.0)
MCHC: 34.2 g/dL (ref 30.0–36.0)
MCV: 85.2 fL (ref 80.0–100.0)
Monocytes Absolute: 0.3 K/uL (ref 0.1–1.0)
Monocytes Relative: 7 %
Neutro Abs: 2.5 K/uL (ref 1.7–7.7)
Neutrophils Relative %: 59 %
Platelet Count: 142 K/uL — ABNORMAL LOW (ref 150–400)
RBC: 5.19 MIL/uL (ref 4.22–5.81)
RDW: 12.2 % (ref 11.5–15.5)
WBC Count: 4.4 K/uL (ref 4.0–10.5)
nRBC: 0 % (ref 0.0–0.2)

## 2024-02-01 NOTE — Progress Notes (Signed)
 Hematology/Oncology Consult note [facility]  [phone] [phone]  Patient Care Team: Liana Fish, NP as PCP - General (Nurse Practitioner) Melanee Annah BROCKS, MD as Consulting Physician (Hematology and Oncology)   Name of the patient: Bobby Dean  [id number]  [birth year]   Date of visit: 02/01/24  Diagnosis-autoimmune neutropenia and thrombocytopenia  Chief complaint/ Reason for visit-routine follow-up of autoimmune pancytopenia  Heme/Onc history: Patient is a 33 year old male was then referred to us  for evaluation and management of thrombocytopenia. He has a history of autism and intellectual visibility and currently lives in a group home for the last 7 months. His medical decision maker is Mr. Rogelia Ada from [place] [facility] office.   Blood work from 04/28/2016 showed white count of 2.1 with an H&H of 15.5/45.1 and a platelet count of 51. CMP was within normal limits. TSH was normal at 1.09. At this time I do not have any prior CBCs for comparison.   Bone marrow biopsy on 05/14/2016 showed normal trilineage hematopoiesis with no evidence of lymphoma or leukemia. Peripheral flow cytometry showed reversal of CD4 to CD8 ratio. HIV hepatitis B and hepatitis C testing was negative. B12 and folate were within normal limits. PT/PTT INR was within normal limits.   Review of peripheral smear showed leukopenia and moderate neutropenia. Thrombocytopenia. RBC morphology is normal. Scattered reactive appearing lymphocytes are seen. No blasts or early forms noted.   Monospot testing was positive. ANA was negative. Quantitative immunoglobulins were within normal limits. Serum copper  was within normal limits. CMV DNA PCR negative   Patient was started on empiric high dose steroids for possible autoimmune etiology on 05/28/16. His counts normalized. Then his steroid taper was started. His WBC remained normal but his platelets began to decline  60's.   Plan was to restart full dose steroids and switch to rituxan  second line after receiving pre splenectomy vaccines in anticipation of splenectomy should he not respond to rituxan    Patient has received all his pre splenectomy vaccines. He started rituxan  on 10/14/16. Seen at [facility] for second opinion by Dr. Jerrye who agrees with the plan.   Recurrent neutropenia/thrombocytopenia noted in sept 2019. rituxan  and steroids restarted on 12/15/17 and counts normalized.  Patient has been requiring Rituxan  roughly every 8 to 12 months  Interval history-he is doing well presently and denies any complaints today.  No history of any recurrent infections.  Appetite and weight have remained stable  ECOG PS- 0 Pain scale- 0   Review of systems- Review of Systems  Constitutional:  Negative for chills, fever, malaise/fatigue and weight loss.  HENT:  Negative for congestion, ear discharge and nosebleeds.   Eyes:  Negative for blurred vision.  Respiratory:  Negative for cough, hemoptysis, sputum production, shortness of breath and wheezing.   Cardiovascular:  Negative for chest pain, palpitations, orthopnea and claudication.  Gastrointestinal:  Negative for abdominal pain, blood in stool, constipation, diarrhea, heartburn, melena, nausea and vomiting.  Genitourinary:  Negative for dysuria, flank pain, frequency, hematuria and urgency.  Musculoskeletal:  Negative for back pain, joint pain and myalgias.  Skin:  Negative for rash.  Neurological:  Negative for dizziness, tingling, focal weakness, seizures, weakness and headaches.  Endo/Heme/Allergies:  Does not bruise/bleed easily.  Psychiatric/Behavioral:  Negative for depression and suicidal ideas. The patient does not have insomnia.       No Known Allergies   Past Medical History:  Diagnosis Date   Anxiety    Autism    Autism  Autoimmune pancytopenia (HCC)      History reviewed. No pertinent surgical history.  Social History    Socioeconomic History   Marital status: Single    Spouse name: Not on file   Number of children: Not on file   Years of education: Not on file   Highest education level: Not on file  Occupational History   Not on file  Tobacco Use   Smoking status: Never   Smokeless tobacco: Never  Vaping Use   Vaping status: Never Used  Substance and Sexual Activity   Alcohol use: No   Drug use: No   Sexual activity: Not Currently  Other Topics Concern   Not on file  Social History Narrative   Not on file   Social Drivers of Health   Financial Resource Strain: Low Risk  (01/25/2021)   Overall Financial Resource Strain (CARDIA)    Difficulty of Paying Living Expenses: Not hard at all  Food Insecurity: No Food Insecurity (01/25/2021)   Hunger Vital Sign    Worried About Running Out of Food in the Last Year: Never true    Ran Out of Food in the Last Year: Never true  Transportation Needs: No Transportation Needs (01/25/2021)   PRAPARE - Administrator, Civil Service (Medical): No    Lack of Transportation (Non-Medical): No  Physical Activity: Sufficiently Active (01/25/2021)   Exercise Vital Sign    Days of Exercise per Week: 4 days    Minutes of Exercise per Session: 40 min  Stress: No Stress Concern Present (01/25/2021)   [name] of Occupational Health - Occupational Stress Questionnaire    Feeling of Stress : Not at all  Social Connections: Moderately Isolated (01/25/2021)   Social Connection and Isolation Panel    Frequency of Communication with Friends and Family: More than three times a week    Frequency of Social Gatherings with Friends and Family: More than three times a week    Attends Religious Services: Never    Database Administrator or Organizations: Yes    Attends Engineer, Structural: More than 4 times per year    Marital Status: Never married  Intimate Partner Violence: Not At Risk (01/25/2021)   Humiliation, Afraid, Rape, and Kick  questionnaire    Fear of Current or Ex-Partner: No    Emotionally Abused: No    Physically Abused: No    Sexually Abused: No    Family History  Problem Relation Age of Onset   Cancer Mother     No current outpatient medications on file.  Physical exam:  Vitals:   02/01/24 1345  BP: (!) 130/94  Pulse: 98  Resp: 19  Temp: 97.6 F (36.4 C)  SpO2: 99%  Weight: 179 lb 8 oz (81.4 kg)   Physical Exam Cardiovascular:     Rate and Rhythm: Normal rate and regular rhythm.     Heart sounds: Normal heart sounds.  Pulmonary:     Effort: Pulmonary effort is normal.     Breath sounds: Normal breath sounds.  Abdominal:     General: Bowel sounds are normal.     Palpations: Abdomen is soft.  Skin:    General: Skin is warm and dry.  Neurological:     Mental Status: He is alert and oriented to person, place, and time.      I have personally reviewed labs listed below:    Latest Ref Rng & Units 11/03/2023   10:32 AM  CMP  Glucose 70 - 99 mg/dL 70   BUN 6 - 20 mg/dL 12   Creatinine 9.23 - 1.27 mg/dL 9.25   Sodium 865 - 855 mmol/L 141   Potassium 3.5 - 5.2 mmol/L 4.1   Chloride 96 - 106 mmol/L 102   CO2 20 - 29 mmol/L 24   Calcium  8.7 - 10.2 mg/dL 9.6       Latest Ref Rng & Units 02/01/2024    1:37 PM  CBC  WBC 4.0 - 10.5 K/uL 4.4   Hemoglobin 13.0 - 17.0 g/dL 84.8   Hematocrit 60.9 - 52.0 % 44.2   Platelets 150 - 400 K/uL 142     Assessment and plan- Patient is a 33 y.o. male here for routine follow-up of autoimmune pancytopenia  Patient has been requiring Rituxan  typically every 8 to 12 months over the last 2 to 3 years when his neutropenia or thrombocytopenia worsens.  Presently his white count is 4.4 with a normal neutrophil count.  Platelets mildly low at 142.  He does not require any Rituxan  at this time.  Will consider giving him Rituxan  when ANC is less than 100 or platelets drop down to less than 30.  CBC with differential in 3 months, 6 months and 9 months and I  will see him back in 9 months   Visit Diagnosis 1. Autoimmune pancytopenia (HCC)      Dr. Annah Skene, MD, MPH [facility] [phone] 02/01/2024 1:54 PM

## 2024-02-25 LAB — POTASSIUM: Potassium: 4.5 mmol/L (ref 3.5–5.2)

## 2024-02-28 ENCOUNTER — Encounter: Payer: Self-pay | Admitting: Nurse Practitioner

## 2024-02-28 ENCOUNTER — Ambulatory Visit (INDEPENDENT_AMBULATORY_CARE_PROVIDER_SITE_OTHER): Admitting: Nurse Practitioner

## 2024-02-28 VITALS — BP 120/78 | HR 78 | Temp 98.1°F | Resp 16 | Ht 74.0 in | Wt 167.0 lb

## 2024-02-28 DIAGNOSIS — Z0001 Encounter for general adult medical examination with abnormal findings: Secondary | ICD-10-CM | POA: Diagnosis not present

## 2024-02-28 DIAGNOSIS — D693 Immune thrombocytopenic purpura: Secondary | ICD-10-CM | POA: Insufficient documentation

## 2024-02-28 DIAGNOSIS — E876 Hypokalemia: Secondary | ICD-10-CM

## 2024-02-28 DIAGNOSIS — F840 Autistic disorder: Secondary | ICD-10-CM

## 2024-02-28 DIAGNOSIS — D61818 Other pancytopenia: Secondary | ICD-10-CM

## 2024-02-28 DIAGNOSIS — F419 Anxiety disorder, unspecified: Secondary | ICD-10-CM

## 2024-02-28 NOTE — Progress Notes (Signed)
 [facility] [address] [place], [place] [address]  Internal MEDICINE  Office Visit Note  Patient Name: Bobby Dean  [id number]  [id number]  Date of Service: 02/28/2024  Chief Complaint  Patient presents with   Medicare Wellness    HPI Adams presents for a medicare annual wellness visit.  Well-appearing 33 y.o. 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  Labs: up to date Potassium level is normal. He does not need to take potassium supplement anymore.  New or worsening pain: none  Other concerns: none  He is in good spirits looking forward to the christmas party at the group on this Friday and then the church christmas party on Sunday this weekend.      12 /16/2025    1:35 PM 01/25/2021    9:11 PM  MMSE - Mini Mental State Exam  Not completed: Unable to complete Unable to complete    Medicare Wellness     Row Name 02/28/24 1503     Visit info / Clinical Intake:   Medicare Wellness Visit Type: Subsequent Annual Wellness Visit   Persons participating in visit and providing information: patient & caregiver   Medicare Wellness Visit Mode: In-person (required for [employer])   Interpreter Needed? No   Pre-visit prep was completed no   AWV questionnaire completed by patient prior to visit? no   Living arrangements: other  group home   Patient's Overall Health Status Rating very good   Typical amount of pain none   Does pain affect daily life? no   Are you currently prescribed opioids? no     Dietary Habits and Nutritional Risks   How many meals a day? 3   Most meals are obtained by having others provide food   In the last 2 weeks, have you had any of the following? none   Diabetic: no     Functional Status   Activities of Daily Living (to include ambulation/medication) Independent   Ambulation Independent   Medication Administration Needs assistance (comment)   Is this a change from baseline? Pre-admission baseline   Home Management  (perform basic housework or laundry) Needs assistance (comment)   Manage your own finances? no   Primary transportation is facility / other   Concerns about vision? no *vision screening is required for [facility]*   Concerns about hearing? no     Fall Screening   Falls in the past year? No   Number of falls in past year 1 or less   Was there an injury with Fall? No   Fall Risk Category Calculator 0     Fall Risk   Patient at Risk for Falls Due to No Fall Risks   Fall risk Follow up Falls evaluation completed     Home and Transportation Safety:   All rugs have non-skid backing? yes   All stairs or steps have railings? yes   Grab bars in the bathtub or shower? yes   Have non-skid surface in bathtub or shower? yes   Good home lighting? yes   Regular seat belt use? yes   Hospital stays in the last year: no     Cognitive Assessment   Difficulty concentrating, remembering, or making decisions?  yes   Will 6CIT or Mini Cog be Completed no   6CIT or Mini Cog Declined patient has a diagnosis of dementia or cognitive impairment     Advance Directives (For Healthcare)   Does Patient Have a Medical Advance Directive? No   Would  patient like information on creating a medical advance directive? No - Guardian declined     Reviewed/Updated    Reviewed/Updated Reviewed All (Medical, Surgical, Family, Medications, Allergies, Care Teams, Patient Goals)          09/08/2021    8:44 AM 09/08/2021    9:00 AM 10/13/2021    9:43 AM 01/11/2022    9:55 AM 02/28/2024    1:35 PM  Fall Risk  Falls in the past year?     0  Was there an injury with Fall?     0  Fall Risk Category Calculator     0  (RETIRED) Patient Fall Risk Level Low fall risk  Low fall risk  Low fall risk  Low fall risk    Fall risk Follow up     Falls evaluation completed     Data saved with a previous flowsheet row definition       02/28/2024    3:03 PM  Depression screen PHQ 2/9  Decreased Interest 0  Down, Depressed,  Hopeless 0  PHQ - 2 Score 0        Current Medication: No outpatient encounter medications on file as of 02/28/2024.   No facility-administered encounter medications on file as of 02/28/2024.    Surgical History: History reviewed. No pertinent surgical history.  Medical History: Past Medical History:  Diagnosis Date   Anxiety    Autism    Autism    Autoimmune pancytopenia (HCC)     Family History: Family History  Problem Relation Age of Onset   Cancer Mother     Social History   Socioeconomic History   Marital status: Single    Spouse name: Not on file   Number of children: Not on file   Years of education: Not on file   Highest education level: Not on file  Occupational History   Not on file  Tobacco Use   Smoking status: Never   Smokeless tobacco: Never  Vaping Use   Vaping status: Never Used  Substance and Sexual Activity   Alcohol use: No   Drug use: No   Sexual activity: Not Currently  Other Topics Concern   Not on file  Social History Narrative   Not on file   Social Drivers of Health   Tobacco Use: Low Risk (02/28/2024)   Patient History    Smoking Tobacco Use: Never    Smokeless Tobacco Use: Never    Passive Exposure: Not on file  Financial Resource Strain: Not on file  Food Insecurity: Not on file  Transportation Needs: Not on file  Physical Activity: Not on file  Stress: Not on file  Social Connections: Not on file  Intimate Partner Violence: Not on file  Depression (PHQ2-9): Low Risk (02/28/2024)   Depression (PHQ2-9)    PHQ-2 Score: 0  Alcohol Screen: Not on file  Housing: Not on file  Utilities: Not on file  Health Literacy: Not on file      Review of Systems  Constitutional:  Negative for activity change, appetite change, chills, fatigue, fever and unexpected weight change.  HENT: Negative.  Negative for congestion, ear pain, postnasal drip, rhinorrhea, sneezing, sore throat and trouble swallowing.   Eyes: Negative.   Negative for redness.  Respiratory: Negative.  Negative for cough, chest tightness, shortness of breath and wheezing.   Cardiovascular: Negative.  Negative for chest pain and palpitations.  Gastrointestinal: Negative.  Negative for abdominal pain, blood in stool, constipation, diarrhea, nausea and vomiting.  Endocrine: Negative.   Genitourinary: Negative.  Negative for difficulty urinating, dysuria, frequency, hematuria and urgency.  Musculoskeletal: Negative.  Negative for arthralgias, back pain, joint swelling, myalgias and neck pain.  Skin: Negative.  Negative for rash and wound.  Allergic/Immunologic: Negative.  Negative for immunocompromised state.  Neurological: Negative.  Negative for dizziness, tremors, seizures, numbness and headaches.  Hematological: Negative.  Negative for adenopathy. Does not bruise/bleed easily.  Psychiatric/Behavioral:  Negative for behavioral problems (Depression), self-injury, sleep disturbance and suicidal ideas. The patient is nervous/anxious.     Vital Signs: BP 120/78   Pulse 78   Temp 98.1 F (36.7 C)   Resp 16   Ht 6' 2 (1.88 m)   Wt 167 lb (75.8 kg)   SpO2 99%   BMI 21.44 kg/m    Physical Exam Vitals reviewed.  Constitutional:      General: He is not in acute distress.    Appearance: Normal appearance. He is well-developed. He is not ill-appearing or diaphoretic.  HENT:     Head: Normocephalic and atraumatic.     Right Ear: Tympanic membrane, ear canal and external ear normal.     Left Ear: Tympanic membrane, ear canal and external ear normal.     Nose: Nose normal. No congestion or rhinorrhea.     Mouth/Throat:     Mouth: Mucous membranes are moist.     Pharynx: Oropharynx is clear. No oropharyngeal exudate or posterior oropharyngeal erythema.  Eyes:     Extraocular Movements: Extraocular movements intact.     Conjunctiva/sclera: Conjunctivae normal.     Pupils: Pupils are equal, round, and reactive to light.  Neck:     Thyroid :  No thyromegaly.     Vascular: No JVD.     Trachea: No tracheal deviation.  Cardiovascular:     Rate and Rhythm: Normal rate and regular rhythm.     Pulses: Normal pulses.     Heart sounds: Normal heart sounds. No murmur heard.    No friction rub. No gallop.  Pulmonary:     Effort: Pulmonary effort is normal. No respiratory distress.     Breath sounds: Normal breath sounds. No wheezing or rales.  Chest:     Chest wall: No tenderness.  Abdominal:     General: Bowel sounds are normal.     Palpations: Abdomen is soft.  Musculoskeletal:        General: Normal range of motion.     Cervical back: Normal range of motion and neck supple.     Right lower leg: No edema.     Left lower leg: No edema.  Lymphadenopathy:     Cervical: No cervical adenopathy.  Skin:    General: Skin is warm and dry.     Capillary Refill: Capillary refill takes less than 2 seconds.  Neurological:     Mental Status: He is alert and oriented to person, place, and time.     Cranial Nerves: No cranial nerve deficit.  Psychiatric:        Mood and Affect: Mood normal.        Behavior: Behavior normal.        Thought Content: Thought content normal.        Judgment: Judgment normal.        Assessment/Plan: 1. Encounter for Medicare annual examination with abnormal findings (Primary) Age-appropriate preventive screenings and vaccinations discussed. Routine labs for health maintenance are up to date. PHM updated.    2. Idiopathic thrombocytopenic purpura (ITP) (HCC) Followed  by hematology  3. 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 Hypokalemia Resolved. Levels are stable, no longer taking potassium supplement.   5. Autism Noted, no issues at this time.   6. Anxiety Stable, no issues at this time.     General Counseling: dequon schnebly understanding of the findings of todays visit and agrees with plan of treatment. I have discussed any further diagnostic evaluation that may be  needed or ordered today. We also reviewed his medications today. he has been encouraged to call the office with any questions or concerns that should arise related to todays visit.    No orders of the defined types were placed in this encounter.   No orders of the defined types were placed in this encounter.   Return in about 3 months (around 05/28/2024) for F/U, Kenzley Ke PCP.   Total time spent:30 Minutes Time spent includes review of chart, medications, test results, and follow up plan with the patient.   [place] Controlled Substance Database was reviewed by me.  This patient was seen by Mardy Maxin, FNP-C in collaboration with Dr. Sigrid Bathe as a part of collaborative care agreement.  Jomarion Mish R. Maxin, MSN, FNP-C Internal medicine

## 2024-05-02 ENCOUNTER — Inpatient Hospital Stay

## 2024-06-04 ENCOUNTER — Ambulatory Visit: Admitting: Nurse Practitioner

## 2024-08-01 ENCOUNTER — Inpatient Hospital Stay

## 2024-10-31 ENCOUNTER — Inpatient Hospital Stay

## 2024-10-31 ENCOUNTER — Inpatient Hospital Stay: Admitting: Oncology

## 2025-03-06 ENCOUNTER — Ambulatory Visit: Admitting: Nurse Practitioner
